# Patient Record
Sex: Female | Born: 1940
Health system: Southern US, Community
[De-identification: ages and names within clinical notes are randomized; demographics above are authoritative.]

## PROBLEM LIST (undated history)

## (undated) DIAGNOSIS — F0390 Unspecified dementia without behavioral disturbance: Secondary | ICD-10-CM

## (undated) DIAGNOSIS — I10 Essential (primary) hypertension: Secondary | ICD-10-CM

## (undated) DIAGNOSIS — R3129 Other microscopic hematuria: Secondary | ICD-10-CM

## (undated) DIAGNOSIS — C4491 Basal cell carcinoma of skin, unspecified: Secondary | ICD-10-CM

## (undated) DIAGNOSIS — E785 Hyperlipidemia, unspecified: Secondary | ICD-10-CM

## (undated) DIAGNOSIS — R911 Solitary pulmonary nodule: Secondary | ICD-10-CM

## (undated) HISTORY — PX: OTHER SURGICAL HISTORY: SHX169

## (undated) HISTORY — DX: Hyperlipidemia, unspecified: E78.5

## (undated) HISTORY — PX: FOOT SURGERY: SHX648

## (undated) HISTORY — PX: BREAST BIOPSY: SHX20

## (undated) HISTORY — DX: Basal cell carcinoma of skin, unspecified: C44.91

## (undated) HISTORY — DX: Solitary pulmonary nodule: R91.1

## (undated) HISTORY — DX: Other microscopic hematuria: R31.29

## (undated) HISTORY — PX: CARPAL TUNNEL RELEASE: SHX101

## (undated) HISTORY — DX: Essential (primary) hypertension: I10

---
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
## 1958-06-24 HISTORY — PX: OOPHORECTOMY: SHX86

## 1982-06-24 HISTORY — PX: ABDOMINAL HYSTERECTOMY: SHX81

## 2002-06-24 HISTORY — PX: TRIGGER FINGER RELEASE: SHX641

## 2008-06-24 DIAGNOSIS — R3129 Other microscopic hematuria: Secondary | ICD-10-CM

## 2008-06-24 HISTORY — DX: Other microscopic hematuria: R31.29

## 2010-06-24 HISTORY — PX: MOHS SURGERY: SUR867

## 2010-09-27 LAB — HM MAMMOGRAPHY: HM Mammogram: NORMAL

## 2011-03-06 DIAGNOSIS — I8390 Asymptomatic varicose veins of unspecified lower extremity: Secondary | ICD-10-CM | POA: Insufficient documentation

## 2011-04-29 ENCOUNTER — Encounter: Payer: Self-pay | Admitting: Internal Medicine

## 2011-04-29 ENCOUNTER — Ambulatory Visit (INDEPENDENT_AMBULATORY_CARE_PROVIDER_SITE_OTHER): Payer: Medicare Other | Admitting: Internal Medicine

## 2011-04-29 DIAGNOSIS — Z79899 Other long term (current) drug therapy: Secondary | ICD-10-CM

## 2011-04-29 DIAGNOSIS — E785 Hyperlipidemia, unspecified: Secondary | ICD-10-CM

## 2011-04-29 DIAGNOSIS — Z9229 Personal history of other drug therapy: Secondary | ICD-10-CM | POA: Insufficient documentation

## 2011-04-29 DIAGNOSIS — R3129 Other microscopic hematuria: Secondary | ICD-10-CM

## 2011-04-29 DIAGNOSIS — Z1239 Encounter for other screening for malignant neoplasm of breast: Secondary | ICD-10-CM | POA: Insufficient documentation

## 2011-04-29 DIAGNOSIS — Z124 Encounter for screening for malignant neoplasm of cervix: Secondary | ICD-10-CM | POA: Insufficient documentation

## 2011-04-29 NOTE — Patient Instructions (Addendum)
We will recheck your cholesterol and decide whether medications are absolutely necessary

## 2011-04-29 NOTE — Progress Notes (Signed)
  Subjective:    Patient ID: Pam Keller, female    DOB: 12-Jun-1941, 70 y.o.   MRN: [id number]  HPI  Pam Keller is a 70 yo white female who presents to establish care.  She has a history of hypertension and hyperlipidimia but discontinued her medications this summer while vacationing at the beach due to having trouble getting her medications refilled through [facility].  She has noticed that since she stopped taking Lipitor her legs have stopped aching.  She is a widow, does not exercise outside of the home except when at the beach, where she usually walks 2 miles.   She has no history of balance or gait disorder and no history of falls.  She is independent with ADLs.  Past Medical History  Diagnosis Date  . Microscopic hematuria 2010    negative workuo   No current outpatient prescriptions on file prior to visit.    Review of Systems  Constitutional: Negative for fever, chills and unexpected weight change.  HENT: Negative for hearing loss, ear pain, nosebleeds, congestion, sore throat, facial swelling, rhinorrhea, sneezing, mouth sores, trouble swallowing, neck pain, neck stiffness, voice change, postnasal drip, sinus pressure, tinnitus and ear discharge.   Eyes: Negative for pain, discharge, redness and visual disturbance.  Respiratory: Negative for cough, chest tightness, shortness of breath, wheezing and stridor.   Cardiovascular: Negative for chest pain, palpitations and leg swelling.  Musculoskeletal: Negative for myalgias and arthralgias.  Skin: Negative for color change and rash.  Neurological: Negative for dizziness, weakness, light-headedness and headaches.  Hematological: Negative for adenopathy.      BP 120/66  Pulse 81  Temp(Src) 98.3 F (36.8 C) (Oral)  Resp 16  Ht 5' 0.5" (1.537 m)  Wt 135 lb 8 oz (61.462 kg)  BMI 26.03 kg/m2  SpO2 98%  Objective:   Physical Exam  Constitutional: She is oriented to person, place, and time. She appears well-developed and  well-nourished.  HENT:  Mouth/Throat: Oropharynx is clear and moist.  Eyes: EOM are normal. Pupils are equal, round, and reactive to light. No scleral icterus.  Neck: Normal range of motion. Neck supple. No JVD present. No thyromegaly present.  Cardiovascular: Normal rate, regular rhythm, normal heart sounds and intact distal pulses.   Pulmonary/Chest: Effort normal and breath sounds normal.  Abdominal: Soft. Bowel sounds are normal. She exhibits no mass. There is no tenderness.  Musculoskeletal: Normal range of motion. She exhibits no edema.  Lymphadenopathy:    She has no cervical adenopathy.  Neurological: She is alert and oriented to person, place, and time.  Skin: Skin is warm and dry.  Psychiatric: She has a normal mood and affect.          Assessment & Plan:  Hypertension:  Currently well controlled.  No changes. 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  Will repeat lipids in 3 months and reassess need for therapy. Pulmonary nodule:  She has apparently had serial surveillance of a pulmonary nodule with next CT scheduled for 07/19/2011 at [facility]. Basal Cell Carcinoma:  She has dermatology followup for histor of skin Ca with [facility]

## 2011-04-30 ENCOUNTER — Other Ambulatory Visit (INDEPENDENT_AMBULATORY_CARE_PROVIDER_SITE_OTHER): Payer: Medicare Other | Admitting: *Deleted

## 2011-04-30 DIAGNOSIS — E785 Hyperlipidemia, unspecified: Secondary | ICD-10-CM

## 2011-04-30 DIAGNOSIS — Z79899 Other long term (current) drug therapy: Secondary | ICD-10-CM

## 2011-04-30 LAB — COMPREHENSIVE METABOLIC PANEL
ALT: 14 U/L (ref 0–35)
Albumin: 4.3 g/dL (ref 3.5–5.2)
Alkaline Phosphatase: 67 U/L (ref 39–117)
Glucose, Bld: 93 mg/dL (ref 70–99)
Potassium: 3.4 mEq/L — ABNORMAL LOW (ref 3.5–5.1)
Sodium: 142 mEq/L (ref 135–145)
Total Protein: 7 g/dL (ref 6.0–8.3)

## 2011-04-30 LAB — LIPID PANEL
HDL: 47.7 mg/dL (ref >39.00)
Total CHOL/HDL Ratio: 6

## 2011-05-01 ENCOUNTER — Other Ambulatory Visit: Payer: Self-pay | Admitting: Internal Medicine

## 2011-05-01 ENCOUNTER — Encounter: Payer: Self-pay | Admitting: Internal Medicine

## 2011-05-01 DIAGNOSIS — I10 Essential (primary) hypertension: Secondary | ICD-10-CM | POA: Insufficient documentation

## 2011-05-01 DIAGNOSIS — E785 Hyperlipidemia, unspecified: Secondary | ICD-10-CM | POA: Insufficient documentation

## 2011-05-01 DIAGNOSIS — C4491 Basal cell carcinoma of skin, unspecified: Secondary | ICD-10-CM | POA: Insufficient documentation

## 2011-05-01 DIAGNOSIS — R911 Solitary pulmonary nodule: Secondary | ICD-10-CM | POA: Insufficient documentation

## 2011-05-01 MED ORDER — TRIAMTERENE-HCTZ 37.5-25 MG PO TABS: 1.0000 | ORAL_TABLET | Freq: Every day | ORAL | Status: DC

## 2011-07-25 DIAGNOSIS — R918 Other nonspecific abnormal finding of lung field: Secondary | ICD-10-CM | POA: Diagnosis not present

## 2011-07-25 DIAGNOSIS — J984 Other disorders of lung: Secondary | ICD-10-CM | POA: Diagnosis not present

## 2011-07-25 DIAGNOSIS — I2510 Atherosclerotic heart disease of native coronary artery without angina pectoris: Secondary | ICD-10-CM | POA: Diagnosis not present

## 2011-08-12 ENCOUNTER — Telehealth: Payer: Self-pay | Admitting: Internal Medicine

## 2011-08-12 MED ORDER — TRIAMTERENE-HCTZ 37.5-25 MG PO TABS: 1.0000 | ORAL_TABLET | Freq: Every day | ORAL | Status: DC

## 2011-08-12 NOTE — Telephone Encounter (Signed)
[phone] Pt walked in needing to get refill on triamteren-hctz 37.5-25mg  cp Take 1 capsule by [facility] that [facility]. Pt is completely out of her meds

## 2011-08-13 ENCOUNTER — Other Ambulatory Visit: Payer: Self-pay | Admitting: *Deleted

## 2011-08-13 DIAGNOSIS — Z85828 Personal history of other malignant neoplasm of skin: Secondary | ICD-10-CM | POA: Diagnosis not present

## 2011-08-13 DIAGNOSIS — L819 Disorder of pigmentation, unspecified: Secondary | ICD-10-CM | POA: Diagnosis not present

## 2011-08-13 DIAGNOSIS — L298 Other pruritus: Secondary | ICD-10-CM | POA: Diagnosis not present

## 2011-08-13 DIAGNOSIS — I872 Venous insufficiency (chronic) (peripheral): Secondary | ICD-10-CM | POA: Diagnosis not present

## 2011-08-13 NOTE — Telephone Encounter (Signed)
Opened in error

## 2011-08-26 ENCOUNTER — Telehealth: Payer: Self-pay | Admitting: Internal Medicine

## 2011-08-26 NOTE — Telephone Encounter (Signed)
[phone]  Home [phone] cell Pt came in today.  She stated she went to the dentist last week dentist stated that he saw pus between the roots of a tooth and he schedule her march 13 to either take out the pus or take out the tooth.  Pt stated the dentist didn't give her any  aniti bodtics  She wanted to get you opinion should she have been given anything prior to this procedure

## 2011-08-27 NOTE — Telephone Encounter (Signed)
Yes she should but I cannot prescribe without seeing her. Do we have a cancellatin this afternoon we can put her in ?

## 2011-08-27 NOTE — Telephone Encounter (Signed)
There are no openings this afternoon.  I have scheduled her for tomorrow afternoon.

## 2011-08-28 ENCOUNTER — Ambulatory Visit (INDEPENDENT_AMBULATORY_CARE_PROVIDER_SITE_OTHER): Payer: Medicare Other | Admitting: Internal Medicine

## 2011-08-28 ENCOUNTER — Encounter: Payer: Self-pay | Admitting: Internal Medicine

## 2011-08-28 VITALS — BP 134/68 | HR 90 | Temp 98.0°F | Resp 14 | Wt 136.2 lb

## 2011-08-28 DIAGNOSIS — K122 Cellulitis and abscess of mouth: Secondary | ICD-10-CM | POA: Diagnosis not present

## 2011-08-28 DIAGNOSIS — K069 Disorder of gingiva and edentulous alveolar ridge, unspecified: Secondary | ICD-10-CM

## 2011-08-28 DIAGNOSIS — K056 Periodontal disease, unspecified: Secondary | ICD-10-CM

## 2011-08-28 MED ORDER — METRONIDAZOLE 500 MG PO TABS: 500.0000 mg | ORAL_TABLET | Freq: Three times a day (TID) | ORAL | Status: AC

## 2011-08-28 MED ORDER — CIPROFLOXACIN HCL 500 MG PO TABS: 500.0000 mg | ORAL_TABLET | Freq: Two times a day (BID) | ORAL | Status: AC

## 2011-08-28 NOTE — Progress Notes (Signed)
Subjective:    Patient ID: Pam Keller, female    DOB: 12/24/1940, 72 y.o.   MRN: [id number]  HPI  71 yr old white female recently seen by dentist and told she had an infected root .  Was told she would need oral suegery but was not given antibiotics.  Patient concerned because her father had an infected tooth pulled and developed blood stream infection and sepsis  Patient denies fevers, chills and tooth pain.  Scheduled to see oral suregeon in one week.  Has allergies (rash swelling) to PCN, and not sure about the listed clindamycin allergy.   Past Medical History  Diagnosis Date  . Microscopic hematuria 2010    negative workuo  . Hyperlipidemia   . Hypertension   . Pulmonary nodule   . Basal cell carcinoma    Current Outpatient Prescriptions on File Prior to Visit  Medication Sig Dispense Refill  . amLODipine (NORVASC) 5 MG tablet Take 5 mg by mouth daily.        Marland Kitchen aspirin 325 MG tablet Take 325 mg by mouth daily.        . calcium carbonate (OS-CAL) 600 MG TABS Take 600 mg by mouth 2 (two) times daily with a meal.        . ibuprofen (ADVIL,MOTRIN) 200 MG tablet Take 200 mg by mouth every 6 (six) hours as needed.        Marland Kitchen losartan (COZAAR) 25 MG tablet Take 25 mg by mouth daily.        . Multiple Vitamin (MULTIVITAMIN) tablet Take 1 tablet by mouth daily.        Marland Kitchen triamterene-hydrochlorothiazide (MAXZIDE-25) 37.5-25 MG per tablet Take 1 each (1 tablet total) by mouth daily.  30 tablet  6       Review of Systems  Constitutional: Negative for fever, chills and unexpected weight change.  HENT: Negative for hearing loss, ear pain, nosebleeds, congestion, sore throat, facial swelling, rhinorrhea, sneezing, mouth sores, trouble swallowing, neck pain, neck stiffness, voice change, postnasal drip, sinus pressure, tinnitus and ear discharge.   Eyes: Negative for pain, discharge, redness and visual disturbance.  Respiratory: Negative for cough, chest tightness, shortness of breath,  wheezing and stridor.   Cardiovascular: Negative for chest pain, palpitations and leg swelling.  Musculoskeletal: Negative for myalgias and arthralgias.  Skin: Negative for color change and rash.  Neurological: Negative for dizziness, weakness, light-headedness and headaches.  Hematological: Negative for adenopathy.       Objective:   Physical Exam  Constitutional: She is oriented to person, place, and time. She appears well-developed and well-nourished.  HENT:  Mouth/Throat: Oropharynx is clear and moist.  Eyes: EOM are normal. Pupils are equal, round, and reactive to light. No scleral icterus.  Neck: Normal range of motion. Neck supple. No JVD present. No thyromegaly present.  Cardiovascular: Normal rate, regular rhythm, normal heart sounds and intact distal pulses.   Pulmonary/Chest: Effort normal and breath sounds normal.  Abdominal: Soft. Bowel sounds are normal. She exhibits no mass. There is no tenderness.  Musculoskeletal: Normal range of motion. She exhibits no edema.  Lymphadenopathy:    She has no cervical adenopathy.  Neurological: She is alert and oriented to person, place, and time.  Skin: Skin is warm and dry.  Psychiatric: She has a normal mood and affect.       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  Awaiting oral surgery.  Given multiple antibiotic  allergies, will treat with cipro and flagyl for one week in anticipation of tooth being pulled.     Updated Medication List Outpatient Encounter Prescriptions as of 08/28/2011  Medication Sig Dispense Refill  . amLODipine (NORVASC) 5 MG tablet Take 5 mg by mouth daily.        Marland Kitchen aspirin 325 MG tablet Take 325 mg by mouth daily.        . calcium carbonate (OS-CAL) 600 MG TABS Take 600 mg by mouth 2 (two) times daily with a meal.        . ibuprofen (ADVIL,MOTRIN) 200 MG tablet Take 200 mg by mouth every 6 (six) hours as needed.        Marland Kitchen  losartan (COZAAR) 25 MG tablet Take 25 mg by mouth daily.        . Multiple Vitamin (MULTIVITAMIN) tablet Take 1 tablet by mouth daily.        Marland Kitchen triamterene-hydrochlorothiazide (MAXZIDE-25) 37.5-25 MG per tablet Take 1 each (1 tablet total) by mouth daily.  30 tablet  6  . ciprofloxacin (CIPRO) 500 MG tablet Take 1 tablet (500 mg total) by mouth 2 (two) times daily.  14 tablet  0  . metroNIDAZOLE (FLAGYL) 500 MG tablet Take 1 tablet (500 mg total) by mouth 3 (three) times daily.  21 tablet  0

## 2011-08-28 NOTE — Patient Instructions (Signed)
I am prescribing two antibiotics to sterilize the infection in your mouth:   Ciprofloxacin 500 mg twice daily for one week  Metronidazole 500 mg three times daily (8 Am 2 pm AND 10 pm)   Avoid all alcoholic beverages until 24 hours after your last dose of metronidazole   You can drink grapefruit juice as long as you wait at least 2 hours after your blood pressure medication

## 2011-08-29 ENCOUNTER — Encounter: Payer: Self-pay | Admitting: Internal Medicine

## 2011-08-29 DIAGNOSIS — K056 Periodontal disease, unspecified: Secondary | ICD-10-CM | POA: Insufficient documentation

## 2011-08-29 NOTE — Assessment & Plan Note (Signed)
Secondary to abscessed tooth, per recent dental evaluation.  Awaiting oral surgery.  Given multiple antibiotic allergies, will treat with cipro and flagyl for one week in anticipation of tooth being pulled.

## 2011-09-27 ENCOUNTER — Encounter: Payer: Self-pay | Admitting: Internal Medicine

## 2011-09-27 ENCOUNTER — Ambulatory Visit (INDEPENDENT_AMBULATORY_CARE_PROVIDER_SITE_OTHER): Payer: Medicare Other | Admitting: Internal Medicine

## 2011-09-27 VITALS — BP 114/66 | HR 90 | Temp 97.9°F | Resp 16 | Wt 135.5 lb

## 2011-09-27 DIAGNOSIS — Z0000 Encounter for general adult medical examination without abnormal findings: Secondary | ICD-10-CM | POA: Diagnosis not present

## 2011-09-27 DIAGNOSIS — Z1211 Encounter for screening for malignant neoplasm of colon: Secondary | ICD-10-CM

## 2011-09-27 DIAGNOSIS — Z1239 Encounter for other screening for malignant neoplasm of breast: Secondary | ICD-10-CM

## 2011-09-27 MED ORDER — AMLODIPINE BESYLATE 5 MG PO TABS: 5.0000 mg | ORAL_TABLET | Freq: Every day | ORAL | Status: DC

## 2011-09-27 NOTE — Patient Instructions (Signed)
Your exam was normal  I recommend you try Red Yeast Rice ,  600 mg twice daily,  To help lower your cholesterol, along with aerobic exercise 5 days per week  Return in June for fasting labs.

## 2011-09-27 NOTE — Progress Notes (Signed)
Patient ID: Pam Keller, female   DOB: 20-Jan-1941, 71 y.o.   MRN: [id number]  The patient is here for annual Medicare wellness examination and management of other chronic and acute problem.  She has had an imporvement in chronic sinus congestion on the left since finsihing cipro and flagyl for an infected tooth .  She has had a laser procedure on the i infectred  tooth and is now taking doxycyline prescribed by her new oral surgeon in [place]  Dr. Burgess Estelle     The risk factors are reflected in the social history.  The roster of all physicians providing medical care to patient - is listed in the Snapshot section of the chart.  Activities of daily living:  The patient is 100% independent in all ADLs: dressing, toileting, feeding as well as independent mobility  Home safety : The patient has smoke detectors in the home. They wear seatbelts.  There are no firearms at home. There is no violence in the home.   There is no risks for hepatitis, STDs or HIV. There is no   history of blood transfusion. They have no travel history to infectious disease endemic areas of the world.  The patient has seen their dentist in the last six month. They have seen their eye doctor in the last year. They admit to slight hearing difficulty with regard to whispered voices and some television programs.  They have deferred audiologic testing in the last year.  They do not  have excessive sun exposure. Discussed the need for sun protection: hats, long sleeves and use of sunscreen if there is significant sun exposure.   Diet: the importance of a healthy diet is discussed. They do have a healthy diet.  The benefits of regular aerobic exercise were discussed. She walks 4 times per week ,  20 minutes.   Depression screen: there are no signs or vegative symptoms of depression- irritability, change in appetite, anhedonia, sadness/tearfullness.  Cognitive assessment: the patient manages all their financial and personal affairs and  is actively engaged. They could relate day,date,year and events; recalled 2/3 objects at 3 minutes; performed clock-face test normally.  The following portions of the patient's history were reviewed and updated as appropriate: allergies, current medications, past family history, past medical history,  past surgical history, past social history  and problem list.  Visual acuity was not assessed per patient preference since she has regular follow up with her ophthalmologist. Hearing and body mass index were assessed and reviewed.   During the course of the visit the patient was educated and counseled about appropriate screening and preventive services including : fall prevention , diabetes screening, nutrition counseling, colorectal cancer screening, and recommended immunizations.    Objective:  BP 114/66  Pulse 90  Temp(Src) 97.9 F (36.6 C) (Oral)  Resp 16  Wt 135 lb 8 oz (61.462 kg)  SpO2 99%  General appearance: alert, cooperative and appears stated age Ears: normal TM's and external ear canals both ears Throat: lips, mucosa, and tongue normal; teeth and gums normal Neck: no adenopathy, no carotid bruit, supple, symmetrical, trachea midline and thyroid not enlarged, symmetric, no tenderness/mass/nodules Back: symmetric, no curvature. ROM normal. No CVA tenderness. Lungs: clear to auscultation bilaterally Heart: regular rate and rhythm, S1, S2 normal, no murmur, click, rub or gallop Abdomen: soft, non-tender; bowel sounds normal; no masses,  no organomegaly Pulses: 2+ and symmetric Skin: Skin color, texture, turgor normal. No rashes or lesions Lymph nodes: Cervical, supraclavicular, and axillary nodes normal.  Assessment and Plan

## 2011-09-29 DIAGNOSIS — Z1211 Encounter for screening for malignant neoplasm of colon: Secondary | ICD-10-CM | POA: Insufficient documentation

## 2011-09-29 NOTE — Assessment & Plan Note (Signed)
Annual mammograms at [facility] prior remote biopsy normal.  willl set up at [facility]  Imaging next April 2013

## 2011-09-29 NOTE — Assessment & Plan Note (Signed)
Breast and pelvic done today.  Sh is s/p hysterectomy.and BSO  No paps need

## 2011-09-29 NOTE — Assessment & Plan Note (Signed)
Last colonoscopy was 2007.

## 2011-10-01 ENCOUNTER — Encounter: Payer: Medicare Other | Admitting: Internal Medicine

## 2011-10-02 DIAGNOSIS — Z1231 Encounter for screening mammogram for malignant neoplasm of breast: Secondary | ICD-10-CM | POA: Diagnosis not present

## 2011-10-08 ENCOUNTER — Telehealth: Payer: Self-pay | Admitting: Internal Medicine

## 2011-10-08 NOTE — Telephone Encounter (Signed)
Her mammogram was normal.  Repeat in one year 

## 2011-10-08 NOTE — Telephone Encounter (Signed)
Patient notified

## 2011-10-10 ENCOUNTER — Telehealth: Payer: Self-pay | Admitting: Internal Medicine

## 2011-10-10 NOTE — Telephone Encounter (Signed)
Left message notifying  Patient.

## 2011-10-10 NOTE — Telephone Encounter (Signed)
Her mammogram was normal.  Repeat in one year 

## 2011-10-17 ENCOUNTER — Encounter: Payer: Self-pay | Admitting: Internal Medicine

## 2011-11-06 ENCOUNTER — Other Ambulatory Visit: Payer: Self-pay | Admitting: Internal Medicine

## 2011-11-06 MED ORDER — TRIAMTERENE-HCTZ 37.5-25 MG PO TABS: 1.0000 | ORAL_TABLET | Freq: Every day | ORAL | Status: DC

## 2011-11-29 ENCOUNTER — Other Ambulatory Visit: Payer: Medicare Other

## 2011-12-11 ENCOUNTER — Other Ambulatory Visit (INDEPENDENT_AMBULATORY_CARE_PROVIDER_SITE_OTHER): Payer: Medicare Other | Admitting: *Deleted

## 2011-12-11 DIAGNOSIS — E785 Hyperlipidemia, unspecified: Secondary | ICD-10-CM | POA: Diagnosis not present

## 2011-12-11 LAB — LIPID PANEL
HDL: 40.1 mg/dL (ref >39.00)
Triglycerides: 334 mg/dL — ABNORMAL HIGH (ref 0.0–149.0)
VLDL: 66.8 mg/dL — ABNORMAL HIGH (ref 0.0–40.0)

## 2012-04-23 DIAGNOSIS — Z961 Presence of intraocular lens: Secondary | ICD-10-CM | POA: Diagnosis not present

## 2012-06-09 ENCOUNTER — Other Ambulatory Visit: Payer: Self-pay | Admitting: Internal Medicine

## 2012-06-09 MED ORDER — TRIAMTERENE-HCTZ 37.5-25 MG PO TABS: 1.0000 | ORAL_TABLET | Freq: Every day | ORAL | Status: DC

## 2012-06-09 NOTE — Telephone Encounter (Signed)
Triamterene hydrochlorothiazide 37.5-25 mg sent to [facility]

## 2012-06-09 NOTE — Telephone Encounter (Signed)
Triamterene -HCTZ 37.10-27-23 MG TB # 30  Take 1 each ( tablet total ) by mouth daily.

## 2012-08-11 DIAGNOSIS — L570 Actinic keratosis: Secondary | ICD-10-CM | POA: Diagnosis not present

## 2012-08-11 DIAGNOSIS — Z85828 Personal history of other malignant neoplasm of skin: Secondary | ICD-10-CM | POA: Diagnosis not present

## 2012-08-11 DIAGNOSIS — L219 Seborrheic dermatitis, unspecified: Secondary | ICD-10-CM | POA: Diagnosis not present

## 2012-08-11 DIAGNOSIS — L820 Inflamed seborrheic keratosis: Secondary | ICD-10-CM | POA: Diagnosis not present

## 2012-09-08 ENCOUNTER — Ambulatory Visit (INDEPENDENT_AMBULATORY_CARE_PROVIDER_SITE_OTHER): Payer: Medicare Other | Admitting: Adult Health

## 2012-09-08 ENCOUNTER — Encounter: Payer: Self-pay | Admitting: Adult Health

## 2012-09-08 ENCOUNTER — Telehealth: Payer: Self-pay | Admitting: Internal Medicine

## 2012-09-08 VITALS — BP 157/84 | HR 73 | Temp 98.5°F | Ht 63.0 in | Wt 137.0 lb

## 2012-09-08 DIAGNOSIS — M542 Cervicalgia: Secondary | ICD-10-CM | POA: Diagnosis not present

## 2012-09-08 DIAGNOSIS — R509 Fever, unspecified: Secondary | ICD-10-CM

## 2012-09-08 DIAGNOSIS — M436 Torticollis: Secondary | ICD-10-CM | POA: Diagnosis not present

## 2012-09-08 DIAGNOSIS — R197 Diarrhea, unspecified: Secondary | ICD-10-CM | POA: Diagnosis not present

## 2012-09-08 MED ORDER — CYCLOBENZAPRINE HCL 5 MG PO TABS: 5.0000 mg | ORAL_TABLET | Freq: Three times a day (TID) | ORAL | Status: DC | PRN

## 2012-09-08 NOTE — Assessment & Plan Note (Signed)
Appears to be muscular in origin. Will check cbc, sed rate, CRP to r/o other causes. Continue ibuprofen every 6 hours as needed, ice/heat for 20 min, 3-4 times a day. I have also order Flexeril for muscle spasms.

## 2012-09-08 NOTE — Telephone Encounter (Signed)
Just an FYI

## 2012-09-08 NOTE — Assessment & Plan Note (Signed)
This has subsided. I suspect this was from food source. She had ordered takeout and developed low grade temp and diarrhea within hours of consuming food. I will check a metabolic panel since she was not able to keep much in her and she was not drinking much fluid. I have instructed patient that if symptoms return or if her temp is 101 or greater she needs to call the office.

## 2012-09-08 NOTE — Progress Notes (Signed)
  Subjective:    Patient ID: Pam Keller, female    DOB: 1940/09/05, 72 y.o.   MRN: [id number]  HPI Patient is a pleasant 72 y/o female who presents with neck stiffness, pain, low grade temp, and diarrhea which has resolved. Sequence of events are as follows:  March 11th - home from [employer] [facility] [employer] March 12th - Felt OK, did a lot of yard work March 13th - Take out from [employer]  - "did not taste right". Later that night she had a temp of 100.  March 14th - Diarrhea all day - Yellow. Took Immodium March 15th - Better then diarrhea afternoon March 16th - Neck pain, lower back of head, no fever March 17th - Pain bilateral sides of neck, also lower head. Fever back up to 100.  Taking ibuprofen 4am, 2pm and 7pm - 2 tablets each time  March 18 - 2am (2 aspirin), 7am (2 ibuprofen), 12:50pm (2 ibuprofen)  She denies pain radiating down her back, nausea or vomiting, photophobia   Review of Systems  Constitutional: Positive for fever. Negative for chills.  HENT: Positive for neck stiffness.   Respiratory: Negative for cough, chest tightness, shortness of breath and wheezing.   Cardiovascular: Negative for chest pain.  Gastrointestinal: Positive for diarrhea. Negative for nausea, vomiting and blood in stool.  Skin: Negative for rash.  Neurological: Negative for dizziness, weakness, light-headedness, numbness and headaches.  Psychiatric/Behavioral: Negative.     BP 157/84  Pulse 73  Temp(Src) 98.5 F (36.9 C) (Oral)  Ht 5\' 3"  (1.6 m)  Wt 137 lb (62.143 kg)  BMI 24.27 kg/m2  SpO2 100%    Objective:   Physical Exam  Constitutional: She is oriented to person, place, and time. She appears well-developed and well-nourished. No distress.  Appears uncomfortable from neck stiffness. She is pleasant and being humorous.  HENT:  Head: Normocephalic and atraumatic.  Mouth/Throat: Oropharynx is clear and moist. No oropharyngeal exudate.  Neck: Neck supple. No tracheal deviation present.  Pain  with movement of head in any direction  Cardiovascular: Normal rate, regular rhythm, normal heart sounds and intact distal pulses.  Exam reveals no gallop.   No murmur heard. Pulmonary/Chest: Effort normal and breath sounds normal. No respiratory distress. She has no wheezes. She has no rales.  Abdominal: Soft. Bowel sounds are normal.  Lymphadenopathy:    She has no cervical adenopathy.  Neurological: She is alert and oriented to person, place, and time. No cranial nerve deficit. Coordination normal.  Negative kernig's & brudzinski's   Skin: Skin is warm and dry. No rash noted.  Psychiatric: She has a normal mood and affect. Her behavior is normal. Judgment and thought content normal.       Assessment & Plan:

## 2012-09-08 NOTE — Telephone Encounter (Signed)
Patient Information:  Caller Name: Katesha  Phone: [phone]  Patient: Pam Keller, Pam Keller  Gender: Female  DOB: March 22, 1941  Age: 72 Years  PCP: Duncan Dull (Adults only)  Office Follow Up:  Does the office need to follow up with this patient?: No  Instructions For The Office: N/A   Symptoms  Reason For Call & Symptoms: Patient reports that for 2-3 days she is having neck discomfort. From the ears downward and back.  There is no discomfort in the front of the neck. Pain with opening mouth.  Denies injury or slept wrong. Recent GI bug 09/03/12 with slight fever over weekend  100.0 and then pain started in back of neck on Sunday 09/06/12. Denies headache or sore throat.  Pain with turning head side to side or touch chin to chest.  Reviewed Health History In EMR: Yes  Reviewed Medications In EMR: Yes  Reviewed Allergies In EMR: Yes  Reviewed Surgeries / Procedures: Yes  Date of Onset of Symptoms: 09/06/2012  Treatments Tried: Ibuprofen , Ice and heat to neck  Treatments Tried Worked: No  Guideline(s) Used:  Neck Pain or Stiffness  Disposition Per Guideline:   See Today in Office  Reason For Disposition Reached:   Patient wants to be seen  Advice Given:  Apply Cold to the Area Or Heat:   During the first 2 days after a mild injury, apply a cold pack or an ice bag (wrapped in a towel) for 20 minutes four times a day. After 2 days, apply a heating pad or hot water bottle to the most painful area for 20 minutes whenever the pain flares up. Wrap hot water bottles or heating pads in a towel to avoid burns.  Pain Medicines:  Ibuprofen (e.g., Motrin, Advil):  Another choice is to take 600 mg (three 200 mg pills) by mouth every 8 hours.  Good Body Mechanics:  Lifting: Stand close to the object to be lifted. Keep your back straight and lift by bending your legs. Ask for help if needed.  Sleeping: Sleep on a firm mattress.  Sitting: Avoid sitting for long periods of time without a  break. Avoid slouching. Place a pillow or towel behind your lower back for support.  Computer screen: place at eye level.  Posture: Maintain good posture.  Call Back If:  Numbness or weakness occurs  You become worse.  Patient Will Follow Care Advice:  YES  Appointment Scheduled:  09/08/2012 15:30:00 Appointment Scheduled Provider:  Orville Govern

## 2012-09-08 NOTE — Patient Instructions (Addendum)
  Please have your labs drawn prior to leaving the office.  For your neck pain:   Take ibuprofen 600 mg every 6 hours as needed for pain. Eat food with the ibuprofen  You can also take tylenol 325 mg every 4 hours as needed.  I am prescribing a muscle relaxer - flexeril. You can take this 3 times a day as needed.   Apply ice alternating with heat for 20 minutes at a time. You can do this 3-4 times daily.  For your diarrhea (if this returns):  If your fever goes above 101, please call the office.  Drink fluids to stay hydrated.

## 2012-09-09 ENCOUNTER — Other Ambulatory Visit: Payer: Self-pay | Admitting: Adult Health

## 2012-09-09 DIAGNOSIS — R899 Unspecified abnormal finding in specimens from other organs, systems and tissues: Secondary | ICD-10-CM

## 2012-09-09 LAB — BASIC METABOLIC PANEL
BUN: 16 mg/dL (ref 6–23)
Calcium: 9.6 mg/dL (ref 8.4–10.5)
Chloride: 96 mEq/L (ref 96–112)
Creatinine, Ser: 1.1 mg/dL (ref 0.4–1.2)
GFR: 54.54 mL/min — ABNORMAL LOW (ref >60.00)

## 2012-09-09 LAB — CBC WITH DIFFERENTIAL/PLATELET
Basophils Absolute: 0 10*3/uL (ref 0.0–0.1)
Eosinophils Absolute: 0.1 10*3/uL (ref 0.0–0.7)
Eosinophils Relative: 1.2 % (ref 0.0–5.0)
HCT: 34.6 % — ABNORMAL LOW (ref 36.0–46.0)
Lymphs Abs: 1.4 10*3/uL (ref 0.7–4.0)
MCV: 86.4 fl (ref 78.0–100.0)
Monocytes Absolute: 0.6 10*3/uL (ref 0.1–1.0)
Neutrophils Relative %: 73.2 % (ref 43.0–77.0)
Platelets: 189 10*3/uL (ref 150.0–400.0)
RDW: 14.1 % (ref 11.5–14.6)
WBC: 7.9 10*3/uL (ref 4.5–10.5)

## 2012-09-09 LAB — C-REACTIVE PROTEIN: CRP: 7.5 mg/dL (ref 0.5–20.0)

## 2012-09-09 LAB — SEDIMENTATION RATE: Sed Rate: 35 mm/hr — ABNORMAL HIGH (ref 0–22)

## 2012-09-16 ENCOUNTER — Other Ambulatory Visit (INDEPENDENT_AMBULATORY_CARE_PROVIDER_SITE_OTHER): Payer: Medicare Other

## 2012-09-16 DIAGNOSIS — R6889 Other general symptoms and signs: Secondary | ICD-10-CM

## 2012-09-16 DIAGNOSIS — R899 Unspecified abnormal finding in specimens from other organs, systems and tissues: Secondary | ICD-10-CM

## 2012-09-16 LAB — BASIC METABOLIC PANEL
Calcium: 9.3 mg/dL (ref 8.4–10.5)
GFR: 70.55 mL/min (ref >60.00)
Glucose, Bld: 70 mg/dL (ref 70–99)
Potassium: 3.3 mEq/L — ABNORMAL LOW (ref 3.5–5.1)
Sodium: 140 mEq/L (ref 135–145)

## 2012-09-29 ENCOUNTER — Encounter: Payer: Self-pay | Admitting: Internal Medicine

## 2012-09-29 ENCOUNTER — Ambulatory Visit (INDEPENDENT_AMBULATORY_CARE_PROVIDER_SITE_OTHER): Payer: Medicare Other | Admitting: Internal Medicine

## 2012-09-29 VITALS — BP 142/80 | HR 79 | Temp 98.0°F | Resp 16 | Ht 61.0 in | Wt 136.8 lb

## 2012-09-29 DIAGNOSIS — Z0000 Encounter for general adult medical examination without abnormal findings: Secondary | ICD-10-CM

## 2012-09-29 DIAGNOSIS — Z124 Encounter for screening for malignant neoplasm of cervix: Secondary | ICD-10-CM | POA: Diagnosis not present

## 2012-09-29 DIAGNOSIS — M436 Torticollis: Secondary | ICD-10-CM | POA: Diagnosis not present

## 2012-09-29 DIAGNOSIS — I10 Essential (primary) hypertension: Secondary | ICD-10-CM

## 2012-09-29 DIAGNOSIS — E876 Hypokalemia: Secondary | ICD-10-CM

## 2012-09-29 DIAGNOSIS — E785 Hyperlipidemia, unspecified: Secondary | ICD-10-CM

## 2012-09-29 DIAGNOSIS — R51 Headache: Secondary | ICD-10-CM

## 2012-09-29 DIAGNOSIS — Z1239 Encounter for other screening for malignant neoplasm of breast: Secondary | ICD-10-CM

## 2012-09-29 LAB — COMPREHENSIVE METABOLIC PANEL
Albumin: 4 g/dL (ref 3.5–5.2)
Alkaline Phosphatase: 80 U/L (ref 39–117)
BUN: 15 mg/dL (ref 6–23)
Calcium: 10.5 mg/dL (ref 8.4–10.5)
Creatinine, Ser: 1 mg/dL (ref 0.4–1.2)
Glucose, Bld: 92 mg/dL (ref 70–99)
Potassium: 3.5 mEq/L (ref 3.5–5.1)

## 2012-09-29 LAB — LIPID PANEL
Cholesterol: 243 mg/dL — ABNORMAL HIGH (ref 0–200)
Total CHOL/HDL Ratio: 7
Triglycerides: 235 mg/dL — ABNORMAL HIGH (ref 0.0–149.0)

## 2012-09-29 MED ORDER — MELOXICAM 15 MG PO TABS: 15.0000 mg | ORAL_TABLET | Freq: Every day | ORAL | Status: DC

## 2012-09-29 NOTE — Progress Notes (Signed)
Patient ID: Pam Keller, female   DOB: 03-19-1941, 72 y.o.   MRN: [id number]  The patient is here for annual Medicare wellness examination and management of other chronic and acute probHere for her  annual wellness exam but has several  acute complaints .  She has been having an Occipital headache, bilateral for 2 weeks accompaneid by bilateral lneck pain form post auricular region to clavicle (SCM).  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  Has improved . She is also having Purulent sinus drainage,  no fevers,  X 1 week  No medications.   Prior use of Flonase in the past for allergic rhinitis.  lems.    The risk factors are reflected in the social history.  The roster of all physicians providing medical care to patient - is listed in the Snapshot section of the chart.  Activities of daily living:  The patient is 100% independent in all ADLs: dressing, toileting, feeding as well as independent mobility  Home safety : The patient has smoke detectors in the home. They wear seatbelts.  There are no firearms at home. There is no violence in the home.   There is no risks for hepatitis, STDs or HIV. There is no   history of blood transfusion. They have no travel history to infectious disease endemic areas of the world.  The patient has seen their dentist in the last six month. They have seen their eye doctor in the last year. They admit to slight hearing difficulty with regard to whispered voices and some television programs.  They have deferred audiologic testing in the last year.  They do not  have excessive sun exposure. Discussed the need for sun protection: hats, long sleeves and use of sunscreen if there is significant sun exposure.   Diet: the importance of a healthy diet is discussed. They do have a healthy diet.  The benefits of regular aerobic exercise were discussed. She walks 4 times per week ,  20 minutes.   Depression screen: there  are no signs or vegative symptoms of depression- irritability, change in appetite, anhedonia, sadness/tearfullness.  Cognitive assessment: the patient manages all their financial and personal affairs and is actively engaged. They could relate day,date,year and events; recalled 2/3 objects at 3 minutes; performed clock-face test normally.  The following portions of the patient's history were reviewed and updated as appropriate: allergies, current medications, past family history, past medical history,  past surgical history, past social history  and problem list.  Visual acuity was not assessed per patient preference since she has regular follow up with her ophthalmologist. Hearing and body mass index were assessed and reviewed.   During the course of the visit the patient was educated and counseled about appropriate screening and preventive services including : fall prevention , diabetes screening, nutrition counseling, colorectal cancer screening, and recommended immunizations.    Objective:   BP 142/80  Pulse 79  Temp(Src) 98 F (36.7 C) (Oral)  Resp 16  Ht 5\' 1"  (1.549 m)  Wt 136 lb 12 oz (62.029 kg)  BMI 25.85 kg/m2  SpO2 98%  General Appearance:    Alert, cooperative, no distress, appears stated age  Head:    Normocephalic, without obvious abnormality, atraumatic  Eyes:    PERRL, conjunctiva/corneas clear, EOM's intact, fundi    benign, both eyes  Ears:    Normal TM's and external ear canals, both ears  Nose:   Nares  normal, septum midline, mucosa normal, no drainage    or sinus tenderness  Throat:   Lips, mucosa, and tongue normal; teeth and gums normal  Neck:   Supple, symmetrical, trachea midline, no adenopathy;    thyroid:  no enlargement/tenderness/nodules; no carotid   bruit or JVD  Back:     Symmetric, no curvature, ROM normal, no CVA tenderness  Lungs:     Clear to auscultation bilaterally, respirations unlabored  Chest Wall:    No tenderness or deformity   Heart:     Regular rate and rhythm, S1 and S2 normal, no murmur, rub   or gallop  Breast Exam:    No tenderness, masses, or nipple abnormality  Abdomen:     Soft, non-tender, bowel sounds active all four quadrants,    no masses, no organomegaly  Extremities:   Extremities normal, atraumatic, no cyanosis or edema  Pulses:   2+ and symmetric all extremities  Skin:   Skin color, texture, turgor normal, no rashes or lesions  Lymph nodes:   Cervical, supraclavicular, and axillary nodes normal  Neurologic:   CNII-XII intact, normal strength, sensation and reflexes    throughout    Assessment and Plan:  Screening for cervical cancer Pelvic was done last year to confirm absence of cervix.    No pelvic done today   Neck stiffness Bilateral SCM tdnerness without lymphadeopathy on exam  liek musculoskeletal.  Trial of meloxicam   Annual physical exam Annual exam including breast ,exam was done  today. All screenings are up to date.   Hypertension Well controlled on current regimen. Renal function stable, no changes today.  Occipital headache She is concerned that her persistent headache may be due to brain tumor given the family history . There is no LAD on exam and neuro exam is normal.  Reassurance provided that her current neck pain appears to be due to muscle strain from recent prolonged yard work. Recommended use of a  NSAIDs, muscle relaxer and ice packs and pain persists  after 2 weeks we will consider imaging the brain .    Updated Medication List Outpatient Encounter Prescriptions as of 09/29/2012  Medication Sig Dispense Refill  . amLODipine (NORVASC) 5 MG tablet Take 1 tablet (5 mg total) by mouth daily.  90 tablet  3  . aspirin 325 MG tablet Take 325 mg by mouth daily.        . Calcium Carbonate-Vitamin D (CALCIUM + D PO) Take by mouth.      . triamterene-hydrochlorothiazide (MAXZIDE-25) 37.5-25 MG per tablet Take 1 each (1 tablet total) by mouth daily.  90 tablet  3  . calcium carbonate  (OS-CAL) 600 MG TABS Take 600 mg by mouth 2 (two) times daily with a meal.        . cyclobenzaprine (FLEXERIL) 5 MG tablet Take 1 tablet (5 mg total) by mouth 3 (three) times daily as needed for muscle spasms.  30 tablet  1  . ibuprofen (ADVIL,MOTRIN) 200 MG tablet Take 200 mg by mouth every 6 (six) hours as needed.        . meloxicam (MOBIC) 15 MG tablet Take 1 tablet (15 mg total) by mouth daily.  30 tablet  1  . Multiple Vitamin (MULTIVITAMIN) tablet Take 1 tablet by mouth daily.         No facility-administered encounter medications on file as of 09/29/2012.

## 2012-09-29 NOTE — Assessment & Plan Note (Signed)
She is concerned that her persistent headache may be due to brain tumor given the family history . There is no LAD on exam and neuro exam is normal.  Reassurance provided that her current neck pain appears to be due to muscle strain from recent prolonged yard work. Recommended use of a proton ice packs and to discontinue after 2 weeks we will consider imaging the

## 2012-09-29 NOTE — Assessment & Plan Note (Signed)
Annual exam including breast ,exam was done  today. All screenings are up to date.

## 2012-09-29 NOTE — Assessment & Plan Note (Signed)
Bilateral SCM tdnerness without lymphadeopathy on exam  liek musculoskeletal.  Trial of meloxicam

## 2012-09-29 NOTE — Assessment & Plan Note (Signed)
Well controlled on current regimen. Renal function stable, no changes today. 

## 2012-09-29 NOTE — Patient Instructions (Addendum)
Please try the meloxicam for your headache and neck strain instead of ibuprofen,  Yo may continue one aspirin daily and add tylenol as needed.  If no better in 2 weeks and we will order an MRI of spine.   Your cholesterol was rechecked today .  If it is still high, you will need to start the cholesterol medication  Try flushing your sinuses twice daily to get rid of the pollen with Simply saline  If you develop fever ,  Sinus pain or blood in your sinus drainage,  Call for an antibioitc.

## 2012-09-29 NOTE — Assessment & Plan Note (Signed)
Pelvic was done last year to confirm absence of cervix.    No pelvic done today

## 2012-10-04 LAB — HM MAMMOGRAPHY

## 2012-10-08 DIAGNOSIS — Z1231 Encounter for screening mammogram for malignant neoplasm of breast: Secondary | ICD-10-CM | POA: Diagnosis not present

## 2012-10-30 ENCOUNTER — Encounter: Payer: Self-pay | Admitting: Internal Medicine

## 2013-01-12 ENCOUNTER — Telehealth: Payer: Self-pay | Admitting: Internal Medicine

## 2013-01-12 MED ORDER — AMLODIPINE BESYLATE 5 MG PO TABS: 5.0000 mg | ORAL_TABLET | Freq: Every day | ORAL | Status: DC

## 2013-01-12 NOTE — Telephone Encounter (Signed)
Refills sent to [facility].  Left message on patient's voice mail advising her that refills were sent to pharmacy.

## 2013-01-12 NOTE — Telephone Encounter (Signed)
Pt came in today needing a refill on amlodipone tab 5mg    Take 1 tablet daily Pt has enough to last 2 weeks [facility] mail order Pt would like to get 90 day supply at a time Please advise pt when called in

## 2013-03-16 ENCOUNTER — Encounter: Payer: Self-pay | Admitting: Internal Medicine

## 2013-03-16 ENCOUNTER — Ambulatory Visit (INDEPENDENT_AMBULATORY_CARE_PROVIDER_SITE_OTHER): Payer: Medicare Other | Admitting: Internal Medicine

## 2013-03-16 VITALS — BP 156/80 | HR 70 | Temp 98.0°F | Resp 14 | Ht 61.0 in | Wt 137.0 lb

## 2013-03-16 DIAGNOSIS — B079 Viral wart, unspecified: Secondary | ICD-10-CM | POA: Diagnosis not present

## 2013-03-16 NOTE — Progress Notes (Signed)
Patient ID: Pam Keller, female   DOB: [birth year], 72 y.o.   MRN: [id number]   Patient Active Problem List   Diagnosis Date Noted  . Verrucous skin lesion 03/16/2013  . Occipital headache 09/29/2012  . Neck stiffness 09/08/2012  . Screening for colon cancer 09/29/2011  . Annual physical exam 09/27/2011  . Hyperlipidemia   . Hypertension   . Pulmonary nodule   . Basal cell carcinoma   . Screening for breast cancer 04/29/2011  . Screening for cervical cancer 04/29/2011  . History of postmenopausal HRT 04/29/2011  . Microscopic hematuria     Subjective:  CC:   Chief Complaint  Patient presents with  . Acute Visit    place is on left shoulder. raised, symetrical     HPI:   Pam Keller a 72 y.o. female who presents For evaluation of a Raised verrucous papillary  lesion on her left shoulder which she states has been present for about a week .  Has not bled.,  Has had No contact with kids or amphibians but she does have an  indoor cat.  Her dermatologic history includes  a basal cell ca removed from left forehead last year at [facility], which she reports that was dismissed as normal by Diona Browner earlier in the year but was noticed during her prior annual exam and referred to [facility] for biopsy and excision    Past Medical History  Diagnosis Date  . Microscopic hematuria 2010    negative workuo  . Hyperlipidemia   . Hypertension   . Pulmonary nodule   . Basal cell carcinoma     Past Surgical History  Procedure Laterality Date  . Macular surgery      [facility], repaired  . Mohs surgery  2012    for basal cell, forehead  . Appendectomy  1955    for rupture appx  . Oophorectomy  1960  . Abdominal hysterectomy  1984    menorrhagia  . Breast biopsy      benign  . Trigger finger release  2004    [facility]  . Carpal tunnel release         The following portions of the patient's history were reviewed and updated as appropriate: Allergies, current medications, and  problem list.    Review of Systems:   12 Pt  review of systems was negative except those addressed in the HPI,     History   Social History  . Marital Status: Widowed    Spouse Name: Roe Coombs    Number of Children: N/A  . Years of Education: N/A   Occupational History  . Not on file.   Social History Main Topics  . Smoking status: Former Smoker    Types: Cigarettes    Quit date: 04/29/2007  . Smokeless tobacco: Never Used  . Alcohol Use: Yes     Comment: Occasionally  . Drug Use: No  . Sexual Activity: Not on file   Other Topics Concern  . Not on file   Social History Narrative   Retired Paramedic          Objective:  Filed Vitals:   03/16/13 1154  BP: 156/80  Pulse: 70  Temp: 98 F (36.7 C)  Resp: 14     General appearance: alert, cooperative and appears stated age Lungs: clear to auscultation bilaterally Heart: regular rate and rhythm, S1, S2 normal, no murmur, click, rub or gallop Abdomen: soft, non-tender; bowel sounds normal; no masses,  no organomegaly Pulses:  2+ and symmetric Skin:  verrucous papillary lesion on top of left shoulder,  eraser head sized.  Lymph nodes: Cervical, supraclavicular, and axillary nodes normal.  Assessment and Plan:  Verrucous skin lesion She has a new lesion this occurred in the top of her left shoulder. It does not appear  be a basal cell or squamous cell carcinoma but given its recent occurrence and its location it  is getting irritated frequently. will refer to Dr. Isa Rankin for removal.   Updated Medication List Outpatient Encounter Prescriptions as of 03/16/2013  Medication Sig Dispense Refill  . amLODipine (NORVASC) 5 MG tablet Take 1 tablet (5 mg total) by mouth daily.  90 tablet  1  . aspirin 325 MG tablet Take 325 mg by mouth daily.        . calcium carbonate (OS-CAL) 600 MG TABS Take 600 mg by mouth 2 (two) times daily with a meal.        . Multiple Vitamin (MULTIVITAMIN) tablet Take 1 tablet by mouth  daily.        Marland Kitchen triamterene-hydrochlorothiazide (MAXZIDE-25) 37.5-25 MG per tablet Take 1 each (1 tablet total) by mouth daily.  90 tablet  3  . Calcium Carbonate-Vitamin D (CALCIUM + D PO) Take by mouth.      . cyclobenzaprine (FLEXERIL) 5 MG tablet Take 1 tablet (5 mg total) by mouth 3 (three) times daily as needed for muscle spasms.  30 tablet  1  . ibuprofen (ADVIL,MOTRIN) 200 MG tablet Take 200 mg by mouth every 6 (six) hours as needed.        . meloxicam (MOBIC) 15 MG tablet Take 1 tablet (15 mg total) by mouth daily.  30 tablet  1   No facility-administered encounter medications on file as of 03/16/2013.     Orders Placed This Encounter  Procedures  . Ambulatory referral to Dermatology    No Follow-up on file.

## 2013-03-18 NOTE — Assessment & Plan Note (Signed)
She has a new lesion this occurred in the top of her left shoulder. It does not appear  be a basal cell or squamous cell carcinoma but given its recent occurrence and its location it  is getting irritated frequently. will refer to Dr. Isa Rankin for removal.

## 2013-04-22 DIAGNOSIS — H35389 Toxic maculopathy, unspecified eye: Secondary | ICD-10-CM | POA: Diagnosis not present

## 2013-06-14 DIAGNOSIS — L819 Disorder of pigmentation, unspecified: Secondary | ICD-10-CM | POA: Diagnosis not present

## 2013-06-14 DIAGNOSIS — L821 Other seborrheic keratosis: Secondary | ICD-10-CM | POA: Diagnosis not present

## 2013-07-19 ENCOUNTER — Telehealth: Payer: Self-pay | Admitting: Internal Medicine

## 2013-07-19 NOTE — Telephone Encounter (Signed)
FYI

## 2013-07-19 NOTE — Telephone Encounter (Signed)
Patient Information:  Caller Name: Norabelle  Phone: [phone]  Patient: Pam Keller  Gender: Female  DOB: 1941-01-21  Age: 73 Years  PCP: Deborra Medina (Adults only)  Office Follow Up:  Does the office need to follow up with this patient?: No  Instructions For The Office: N/A  RN Note:  Home care advice and call back information.  Pt states that she will call back to make appt  Symptoms  Reason For Call & Symptoms: Pt states that she was washing hair on 1/23 and got water in ear.  Pt states that she has tried to lay on that side and has applied heat to the ear and still has the sensation that water is in ear.  Reviewed Health History In EMR: Yes  Reviewed Medications In EMR: Yes  Reviewed Allergies In EMR: Yes  Reviewed Surgeries / Procedures: Yes  Date of Onset of Symptoms: 07/16/2013  Guideline(s) Used:  Ear - Congestion  Disposition Per Guideline:   Home Care  Reason For Disposition Reached:   Ear congestion  Advice Given:  Causes  Upper respiratory infections (colds) and nasal allergies (hay fever) can block the eustachian tube.  Blowing the nose too hard can also push air and fluid into the eustachian tube.  Reassurance:  Definition: Ear congestion is the medical term used to describe symptoms of a stuffy, full, or plugged sensation in the ear. People also sometimes describe crackling or popping noises in the ear. Sometimes hearing seems slightly muffled.  Eustacian tube: There is a small collapsible tube that runs between the middle ear and the nose. Normally, it permits tiny amounts of air to move in and out of the middle ear. When the tube gets blocked, air or fluid can build up behind the ear drum (tympanic membrane). This causes the symptoms of ear congestion.  Here is some care advice that should help.  Treatment - Chewing and Swallowing:   Try chewing gum.  You can also try swallowing water while pinching your nostrils closed. The reason this works is  that it creates a small vacuum in the nose. This helps the eustachian tube to open up.  Treatment - Decongestant Nasal Spray:  If chewing or swallowing doesn't help after 1 or 2 hours, you can try using an over-the-counter (OTC) nasal decongestant drops. You can use the nose drops twice a day.  Oxymetazoline Nasal Drops (e.g., Afrin): Available OTC. Clean out the nose before using. Spray each nostril once, wait one minute for it to absorb, and then spray a second time.  Phenylephrine Nasal Drops (e.g., Neo-Synephrine): Available OTC. Clean out the nose before using. Spray each nostril once, wait one minute for it to absorb, and then spray a second time.  Before taking any medicine, read all the instructions on the package.  Caution - Antihistamines (e.g. diphenhydramine (Benadryl), chlorpheniramine (Chlortrimeton, Chlor-tripolon)):   May cause sleepiness. Do not drink alcohol, drive or operate dangerous machinery while taking antihistamines.  Expected Course:   The symptoms usually get better within 2 days (48 hours) with treatment.  Call Back If:   Ear congestion lasts over 48 hours  Ear pain or fever occurs  You become worse.  Patient Will Follow Care Advice:  YES  Triage and Documentation performed by Sherryle Lis, RN-CAN

## 2013-10-04 ENCOUNTER — Ambulatory Visit (INDEPENDENT_AMBULATORY_CARE_PROVIDER_SITE_OTHER): Payer: Medicare Other | Admitting: Internal Medicine

## 2013-10-04 ENCOUNTER — Encounter: Payer: Self-pay | Admitting: Internal Medicine

## 2013-10-04 VITALS — BP 134/70 | HR 67 | Temp 97.6°F | Resp 16 | Ht 61.0 in | Wt 135.5 lb

## 2013-10-04 DIAGNOSIS — Z113 Encounter for screening for infections with a predominantly sexual mode of transmission: Secondary | ICD-10-CM | POA: Diagnosis not present

## 2013-10-04 DIAGNOSIS — I10 Essential (primary) hypertension: Secondary | ICD-10-CM | POA: Diagnosis not present

## 2013-10-04 DIAGNOSIS — F5104 Psychophysiologic insomnia: Secondary | ICD-10-CM

## 2013-10-04 DIAGNOSIS — R0989 Other specified symptoms and signs involving the circulatory and respiratory systems: Secondary | ICD-10-CM

## 2013-10-04 DIAGNOSIS — Z0000 Encounter for general adult medical examination without abnormal findings: Secondary | ICD-10-CM | POA: Diagnosis not present

## 2013-10-04 DIAGNOSIS — Z23 Encounter for immunization: Secondary | ICD-10-CM | POA: Diagnosis not present

## 2013-10-04 DIAGNOSIS — E785 Hyperlipidemia, unspecified: Secondary | ICD-10-CM

## 2013-10-04 DIAGNOSIS — Z1239 Encounter for other screening for malignant neoplasm of breast: Secondary | ICD-10-CM

## 2013-10-04 DIAGNOSIS — R5383 Other fatigue: Secondary | ICD-10-CM | POA: Diagnosis not present

## 2013-10-04 DIAGNOSIS — Z124 Encounter for screening for malignant neoplasm of cervix: Secondary | ICD-10-CM

## 2013-10-04 DIAGNOSIS — G4700 Insomnia, unspecified: Secondary | ICD-10-CM

## 2013-10-04 DIAGNOSIS — R5381 Other malaise: Secondary | ICD-10-CM | POA: Diagnosis not present

## 2013-10-04 DIAGNOSIS — E559 Vitamin D deficiency, unspecified: Secondary | ICD-10-CM

## 2013-10-04 DIAGNOSIS — H6120 Impacted cerumen, unspecified ear: Secondary | ICD-10-CM

## 2013-10-04 DIAGNOSIS — Z79899 Other long term (current) drug therapy: Secondary | ICD-10-CM

## 2013-10-04 DIAGNOSIS — Z13820 Encounter for screening for osteoporosis: Secondary | ICD-10-CM

## 2013-10-04 LAB — CBC WITH DIFFERENTIAL/PLATELET
Basophils Absolute: 0 10*3/uL (ref 0.0–0.1)
Basophils Relative: 0.6 % (ref 0.0–3.0)
Eosinophils Absolute: 0.2 10*3/uL (ref 0.0–0.7)
Eosinophils Relative: 3.2 % (ref 0.0–5.0)
HCT: 38 % (ref 36.0–46.0)
Hemoglobin: 12.8 g/dL (ref 12.0–15.0)
LYMPHS PCT: 33.5 % (ref 12.0–46.0)
Lymphs Abs: 2.4 10*3/uL (ref 0.7–4.0)
MCHC: 33.6 g/dL (ref 30.0–36.0)
MCV: 88.1 fl (ref 78.0–100.0)
Monocytes Absolute: 0.6 10*3/uL (ref 0.1–1.0)
Monocytes Relative: 8.4 % (ref 3.0–12.0)
Neutro Abs: 3.9 10*3/uL (ref 1.4–7.7)
Neutrophils Relative %: 54.3 % (ref 43.0–77.0)
PLATELETS: 235 10*3/uL (ref 150.0–400.0)
RBC: 4.32 Mil/uL (ref 3.87–5.11)
RDW: 14.4 % (ref 11.5–14.6)
WBC: 7.2 10*3/uL (ref 4.5–10.5)

## 2013-10-04 LAB — COMPREHENSIVE METABOLIC PANEL
ALK PHOS: 62 U/L (ref 39–117)
ALT: 16 U/L (ref 0–35)
AST: 30 U/L (ref 0–37)
Albumin: 4.2 g/dL (ref 3.5–5.2)
BILIRUBIN TOTAL: 0.9 mg/dL (ref 0.3–1.2)
BUN: 16 mg/dL (ref 6–23)
CHLORIDE: 98 meq/L (ref 96–112)
CO2: 27 mEq/L (ref 19–32)
Calcium: 10.1 mg/dL (ref 8.4–10.5)
Creatinine, Ser: 1 mg/dL (ref 0.4–1.2)
GFR: 58.89 mL/min — ABNORMAL LOW (ref >60.00)
GLUCOSE: 87 mg/dL (ref 70–99)
POTASSIUM: 3.7 meq/L (ref 3.5–5.1)
Sodium: 138 mEq/L (ref 135–145)
Total Protein: 7.3 g/dL (ref 6.0–8.3)

## 2013-10-04 LAB — TSH: TSH: 0.77 u[IU]/mL (ref 0.35–5.50)

## 2013-10-04 LAB — LIPID PANEL
CHOLESTEROL: 264 mg/dL — AB (ref 0–200)
HDL: 41.6 mg/dL (ref >39.00)
LDL Cholesterol: 191 mg/dL — ABNORMAL HIGH (ref 0–99)
TRIGLYCERIDES: 158 mg/dL — AB (ref 0.0–149.0)
Total CHOL/HDL Ratio: 6
VLDL: 31.6 mg/dL (ref 0.0–40.0)

## 2013-10-04 MED ORDER — AMLODIPINE BESYLATE 5 MG PO TABS: 5.0000 mg | ORAL_TABLET | Freq: Every day | ORAL | Status: DC

## 2013-10-04 MED ORDER — TRIAMTERENE-HCTZ 37.5-25 MG PO TABS: 1.0000 | ORAL_TABLET | Freq: Every day | ORAL | Status: DC

## 2013-10-04 MED ORDER — MELOXICAM 15 MG PO TABS: 15.0000 mg | ORAL_TABLET | Freq: Every day | ORAL | Status: DC

## 2013-10-04 MED ORDER — MELATONIN-TRYPTOPHAN 3-100 MG PO CAPS: 1.0000 | ORAL_CAPSULE | Freq: Every day | ORAL | Status: DC

## 2013-10-04 NOTE — Progress Notes (Signed)
Pre-visit discussion using our clinic review tool. No additional management support is needed unless otherwise documented below in the visit note.  

## 2013-10-04 NOTE — Progress Notes (Signed)
Patient ID: Pam Keller, female   DOB: March 16, 1941, 73 y.o.   MRN: [id number]  The patient is here for annual Medicare wellness examination and management of other chronic and acute problems.  1)Husband died in 10-Oct-2010.  She is not depressed but feels  overwhelmed by the yardwork,  but not the house. Children are supportive.  She remains socially interactive.     2) Trouble staying asleep,  Chronic.  Has a long history of working 3rd shift.  Wakes up between  2 to 4 am most night and can't go back to sleep.  Not always due to noctuira,  Sometimes the cat. No anxiety.   Discussed trial of melatonin  3) back pain.  Aggravated by yard work.  Felt she pulled a muscle when she bent over.spent ours pulling dandelions.   Her left buttock hurt for a while ,  Now just lower back on both  Sides.   Pain with changing position .  Taking ibuprofen 2  Once daily .  Discussed changing to meloxicam once daily   4) Right ear pain, occurred after washing hair , unable to dispel the water in it.  Feels there's still fluid in it  Ear exam shows cerumen impaction   No inflammation.  Advised to return on Tuesday for flushing,   The risk factors are reflected in the social history.  The roster of all physicians providing medical care to patient - is listed in the Snapshot section of the chart.  Activities of daily living:  The patient is 100% independent in all ADLs: dressing, toileting, feeding as well as independent mobility  Home safety : The patient has smoke detectors in the home. They wear seatbelts.  There are no firearms at home. There is no violence in the home.   There is no risks for hepatitis, STDs or HIV. There is no   history of blood transfusion. They have no travel history to infectious disease endemic areas of the world.  The patient has seen their dentist in the last six month. They have seen their eye doctor in the last year. They admit to slight hearing difficulty with regard to whispered voices  and some television programs.  They have deferred audiologic testing in the last year.  They do not  have excessive sun exposure. Discussed the need for sun protection: hats, long sleeves and use of sunscreen if there is significant sun exposure.   Diet: the importance of a healthy diet is discussed. They do have a healthy diet.  The benefits of regular aerobic exercise were discussed. She walks 4 times per week ,  20 minutes.   Depression screen: there are no signs or vegative symptoms of depression- irritability, change in appetite, anhedonia, sadness/tearfullness.  Cognitive assessment: the patient manages all their financial and personal affairs and is actively engaged. They could relate day,date,year and events; recalled 2/3 objects at 3 minutes; performed clock-face test normally.  The following portions of the patient's history were reviewed and updated as appropriate: allergies, current medications, past family history, past medical history,  past surgical history, past social history  and problem list.  Visual acuity was not assessed per patient preference since she has regular follow up with her ophthalmologist. Hearing and body mass index were assessed and reviewed.   During the course of the visit the patient was educated and counseled about appropriate screening and preventive services including : fall prevention , diabetes screening, nutrition counseling, colorectal cancer screening, and recommended immunizations.  Objective:  BP 134/70  Pulse 67  Temp(Src) 97.6 F (36.4 C) (Oral)  Resp 16  Ht 5\' 1"  (1.549 m)  Wt 135 lb 8 oz (61.462 kg)  BMI 25.62 kg/m2  SpO2 99%  General appearance: alert, cooperative and appears stated age Head: Normocephalic, without obvious abnormality, atraumatic Eyes: conjunctivae/corneas clear. PERRL, EOM's intact. Fundi benign. Ears: normal TM's and external ear canals both ears Nose: Nares normal. Septum midline. Mucosa normal. No drainage or  sinus tenderness. Throat: lips, mucosa, and tongue normal; teeth and gums normal Neck: no adenopathy, no carotid bruit, no JVD, supple, symmetrical, trachea midline and thyroid not enlarged, symmetric, no tenderness/mass/nodules Lungs: clear to auscultation bilaterally Breasts: normal appearance, no masses or tenderness Heart: regular rate and rhythm, S1, S2 normal, no murmur, click, rub or gallop Abdomen: soft, non-tender; bowel sounds normal; no masses,  no organomegaly Extremities: extremities normal, atraumatic, no cyanosis or edema Pulses: 2+ and symmetric Skin: Skin color, texture, turgor normal. No rashes or lesions Neurologic: Alert and oriented X 3, normal strength and tone. Normal symmetric reflexes. Normal coordination and gait.   Assessment and Plan:  Hypertension Well controlled on current regimen. Renal function stable, no changes today.  Lab Results  Component Value Date   CREATININE 1.0 10/04/2013   Lab Results  Component Value Date   NA 138 10/04/2013   K 3.7 10/04/2013   CL 98 10/04/2013   CO2 27 10/04/2013     Screening for cervical cancer She is s/p hysterectomy for nonmalignant reasons,  No pelvic was done today.   Hyperlipidemia New ACC guidelines recommend starting patients aged 54 or higher on moderate intensity statin therapy for LDL between 70-189 and 10 yr risk of CAD > 7.5% ;  and high intensity therapy for anyone with LDL > 190. Advised her to contineu statin therapy Lab Results  Component Value Date   CHOL 264* 10/04/2013   HDL 41.60 10/04/2013   LDLCALC 191* 10/04/2013   LDLDIRECT 147.8 09/29/2012   TRIG 158.0* 10/04/2013   CHOLHDL 6 10/04/2013     Carotid bruit present Noted on right side today.  Referring for carotid dopplers.  Will advise treatment of lipids with statin and asa.   Annual physical exam Annual comprehensive exam was done including breast,excluding pelvic and PAP smear. All screenings have been addressed .   Chronic  insomnia Trial of melatonin advised .   Cerumen impaction She will return for irrigation of impacted ear canal    Updated Medication List Outpatient Encounter Prescriptions as of 10/04/2013  Medication Sig  . amLODipine (NORVASC) 5 MG tablet Take 1 tablet (5 mg total) by mouth daily.  Marland Kitchen aspirin 325 MG tablet Take 325 mg by mouth daily.    . calcium carbonate (OS-CAL) 600 MG TABS Take 600 mg by mouth 2 (two) times daily with a meal.    . Calcium Carbonate-Vitamin D (CALCIUM + D PO) Take by mouth.  . Multiple Vitamin (MULTIVITAMIN) tablet Take 1 tablet by mouth daily.    . [DISCONTINUED] amLODipine (NORVASC) 5 MG tablet Take 1 tablet (5 mg total) by mouth daily.  . [DISCONTINUED] ibuprofen (ADVIL,MOTRIN) 200 MG tablet Take 200 mg by mouth every 6 (six) hours as needed.    . cyclobenzaprine (FLEXERIL) 5 MG tablet Take 1 tablet (5 mg total) by mouth 3 (three) times daily as needed for muscle spasms.  . Melatonin-Tryptophan 3-100 MG CAPS Take 1 capsule by mouth daily with supper.  . meloxicam (MOBIC) 15 MG tablet Take  1 tablet (15 mg total) by mouth daily.  Marland Kitchen triamterene-hydrochlorothiazide (MAXZIDE-25) 37.5-25 MG per tablet Take 1 tablet by mouth daily.  . [DISCONTINUED] meloxicam (MOBIC) 15 MG tablet Take 1 tablet (15 mg total) by mouth daily.  . [DISCONTINUED] meloxicam (MOBIC) 15 MG tablet Take 1 tablet (15 mg total) by mouth daily.  . [DISCONTINUED] triamterene-hydrochlorothiazide (MAXZIDE-25) 37.5-25 MG per tablet Take 1 each (1 tablet total) by mouth daily.

## 2013-10-04 NOTE — Patient Instructions (Signed)
You had your annual Medicare wellness exam today  We will schedule your mammogram, your carotid ultrasound  and your bone density test soon.   You received the pneumonia vaccine today.  We will contact you with the bloodwork results  You can try meloxicam once daily instead of ibuprofen for your back strain,   Melatonin tablet after dinner for insomnia  Return thursday afternoon to have your right ear cleaned

## 2013-10-05 ENCOUNTER — Ambulatory Visit: Payer: Medicare Other | Admitting: *Deleted

## 2013-10-05 ENCOUNTER — Encounter: Payer: Self-pay | Admitting: Emergency Medicine

## 2013-10-05 VITALS — BP 148/80 | HR 64 | Resp 16

## 2013-10-05 DIAGNOSIS — H6120 Impacted cerumen, unspecified ear: Secondary | ICD-10-CM | POA: Insufficient documentation

## 2013-10-05 DIAGNOSIS — F5104 Psychophysiologic insomnia: Secondary | ICD-10-CM | POA: Insufficient documentation

## 2013-10-05 LAB — HEPATITIS C ANTIBODY: HCV Ab: NEGATIVE

## 2013-10-05 LAB — VITAMIN D 25 HYDROXY (VIT D DEFICIENCY, FRACTURES): VIT D 25 HYDROXY: 44 ng/mL (ref 30–89)

## 2013-10-05 NOTE — Assessment & Plan Note (Signed)
Noted on right side today.  Referring for carotid dopplers.  Will advise treatment of lipids with statin and asa.

## 2013-10-05 NOTE — Assessment & Plan Note (Signed)
She is s/p hysterectomy for nonmalignant reasons,  No pelvic was done today.

## 2013-10-05 NOTE — Assessment & Plan Note (Signed)
Annual comprehensive exam was done including breast, excluding pelvic and PAP smear. All screenings have been addressed .  

## 2013-10-05 NOTE — Assessment & Plan Note (Addendum)
New ACC guidelines recommend starting patients aged 75 or higher on moderate intensity statin therapy for LDL between 70-189 and 10 yr risk of CAD > 7.5% ;  and high intensity therapy for anyone with LDL > 190. Advised her to considier therapy Lab Results  Component Value Date   CHOL 264* 10/04/2013   HDL 41.60 10/04/2013   LDLCALC 191* 10/04/2013   LDLDIRECT 147.8 09/29/2012   TRIG 158.0* 10/04/2013   CHOLHDL 6 10/04/2013

## 2013-10-05 NOTE — Assessment & Plan Note (Signed)
She will return for irrigation of impacted ear canal

## 2013-10-05 NOTE — Assessment & Plan Note (Signed)
Well controlled on current regimen. Renal function stable, no changes today.  Lab Results  Component Value Date   CREATININE 1.0 10/04/2013   Lab Results  Component Value Date   NA 138 10/04/2013   K 3.7 10/04/2013   CL 98 10/04/2013   CO2 27 10/04/2013

## 2013-10-05 NOTE — Assessment & Plan Note (Signed)
Trial of melatonin advised .

## 2013-10-07 ENCOUNTER — Ambulatory Visit: Payer: Self-pay | Admitting: Internal Medicine

## 2013-10-07 DIAGNOSIS — I6529 Occlusion and stenosis of unspecified carotid artery: Secondary | ICD-10-CM | POA: Diagnosis not present

## 2013-10-07 DIAGNOSIS — I658 Occlusion and stenosis of other precerebral arteries: Secondary | ICD-10-CM | POA: Diagnosis not present

## 2013-10-07 DIAGNOSIS — R0989 Other specified symptoms and signs involving the circulatory and respiratory systems: Secondary | ICD-10-CM | POA: Diagnosis not present

## 2013-10-07 MED ORDER — SIMVASTATIN 20 MG PO TABS: 20.0000 mg | ORAL_TABLET | Freq: Every day | ORAL | Status: DC

## 2013-10-07 NOTE — Addendum Note (Signed)
Addended by: Crecencio Mc on: 10/07/2013 07:00 AM   Modules accepted: Orders

## 2013-10-11 ENCOUNTER — Telehealth: Payer: Self-pay | Admitting: Internal Medicine

## 2013-10-11 DIAGNOSIS — R0989 Other specified symptoms and signs involving the circulatory and respiratory systems: Secondary | ICD-10-CM

## 2013-10-11 NOTE — Telephone Encounter (Signed)
Left detailed message on home phone per DPR. Also advised patient to return call with any questions.

## 2013-10-11 NOTE — Telephone Encounter (Signed)
Her carotid dopplers showed atherosclerosis bilaterally,  Right greater than left , less than 50% bilaterally .  This should be managed medically with asa  and cholesterol lowering medication , which she has agreed to take.   The ultrasound should be repeated annually

## 2013-10-11 NOTE — Assessment & Plan Note (Signed)
Her carotid dopplers showed atherosclerosis bilaterally,  Right greater than left , less than 50% bilaterally .  This should be managed medically with asa  and cholesterol lowering medication , which she has agreed to take.

## 2013-10-18 ENCOUNTER — Telehealth: Payer: Self-pay | Admitting: Internal Medicine

## 2013-10-18 NOTE — Telephone Encounter (Signed)
Pt came in today stating she lost her amlodipine med.  She stated she has looked everywhere for her meds.  She stated [facility] they told her it would be @$133 to replace her med.  She wanted to know what else she could do.  She stated she couldn't pay $133 to replace  Please advise pt

## 2013-10-18 NOTE — Telephone Encounter (Signed)
Left message for patient to return call to office. 

## 2013-10-19 NOTE — Telephone Encounter (Signed)
Patient found medication stared in different bottle than normally dispensed in advised patient to always read label before taking medication.

## 2013-10-25 DIAGNOSIS — E2839 Other primary ovarian failure: Secondary | ICD-10-CM | POA: Diagnosis not present

## 2013-10-25 DIAGNOSIS — R922 Inconclusive mammogram: Secondary | ICD-10-CM | POA: Diagnosis not present

## 2013-10-25 DIAGNOSIS — Z1231 Encounter for screening mammogram for malignant neoplasm of breast: Secondary | ICD-10-CM | POA: Diagnosis not present

## 2013-10-25 LAB — HM DEXA SCAN: HM Dexa Scan: NORMAL

## 2013-10-29 ENCOUNTER — Encounter: Payer: Self-pay | Admitting: *Deleted

## 2013-10-29 ENCOUNTER — Telehealth: Payer: Self-pay | Admitting: Internal Medicine

## 2013-10-29 NOTE — Telephone Encounter (Signed)
Letter mailed to patient with results.

## 2013-10-29 NOTE — Telephone Encounter (Signed)
Her bone density test was normal. Repeat in 5 years

## 2013-11-05 ENCOUNTER — Encounter: Payer: Self-pay | Admitting: Internal Medicine

## 2013-11-17 ENCOUNTER — Other Ambulatory Visit (INDEPENDENT_AMBULATORY_CARE_PROVIDER_SITE_OTHER): Payer: Medicare Other

## 2013-11-17 ENCOUNTER — Encounter: Payer: Self-pay | Admitting: Internal Medicine

## 2013-11-17 DIAGNOSIS — E785 Hyperlipidemia, unspecified: Secondary | ICD-10-CM

## 2013-11-17 DIAGNOSIS — E876 Hypokalemia: Secondary | ICD-10-CM

## 2013-11-17 DIAGNOSIS — Z79899 Other long term (current) drug therapy: Secondary | ICD-10-CM | POA: Diagnosis not present

## 2013-11-17 LAB — LIPID PANEL
Cholesterol: 178 mg/dL (ref 0–200)
HDL: 41.6 mg/dL (ref >39.00)
LDL Cholesterol: 97 mg/dL (ref 0–99)
Total CHOL/HDL Ratio: 4
Triglycerides: 196 mg/dL — ABNORMAL HIGH (ref 0.0–149.0)
VLDL: 39.2 mg/dL (ref 0.0–40.0)

## 2013-11-17 LAB — COMPREHENSIVE METABOLIC PANEL
ALK PHOS: 56 U/L (ref 39–117)
ALT: 14 U/L (ref 0–35)
AST: 23 U/L (ref 0–37)
Albumin: 3.8 g/dL (ref 3.5–5.2)
BILIRUBIN TOTAL: 1 mg/dL (ref 0.2–1.2)
BUN: 16 mg/dL (ref 6–23)
CO2: 29 mEq/L (ref 19–32)
Calcium: 9.5 mg/dL (ref 8.4–10.5)
Chloride: 102 mEq/L (ref 96–112)
Creatinine, Ser: 1.1 mg/dL (ref 0.4–1.2)
GFR: 52.62 mL/min — ABNORMAL LOW (ref >60.00)
Glucose, Bld: 96 mg/dL (ref 70–99)
Potassium: 3.4 mEq/L — ABNORMAL LOW (ref 3.5–5.1)
SODIUM: 140 meq/L (ref 135–145)
TOTAL PROTEIN: 6.5 g/dL (ref 6.0–8.3)

## 2013-11-19 DIAGNOSIS — E876 Hypokalemia: Secondary | ICD-10-CM | POA: Insufficient documentation

## 2013-11-19 MED ORDER — POTASSIUM CHLORIDE CRYS ER 20 MEQ PO TBCR: 20.0000 meq | EXTENDED_RELEASE_TABLET | Freq: Every day | ORAL | Status: DC

## 2013-11-19 NOTE — Addendum Note (Signed)
Addended by: Crecencio Mc on: 11/19/2013 01:19 PM   Modules accepted: Orders

## 2013-11-22 ENCOUNTER — Encounter: Payer: Self-pay | Admitting: *Deleted

## 2014-01-05 ENCOUNTER — Encounter: Payer: Self-pay | Admitting: Internal Medicine

## 2014-01-05 ENCOUNTER — Ambulatory Visit (INDEPENDENT_AMBULATORY_CARE_PROVIDER_SITE_OTHER): Payer: Medicare Other | Admitting: Internal Medicine

## 2014-01-05 VITALS — BP 152/82 | HR 66 | Temp 98.5°F | Resp 14 | Ht 61.0 in | Wt 131.5 lb

## 2014-01-05 DIAGNOSIS — S81809A Unspecified open wound, unspecified lower leg, initial encounter: Secondary | ICD-10-CM | POA: Diagnosis not present

## 2014-01-05 DIAGNOSIS — S81801A Unspecified open wound, right lower leg, initial encounter: Secondary | ICD-10-CM

## 2014-01-05 DIAGNOSIS — S81009A Unspecified open wound, unspecified knee, initial encounter: Secondary | ICD-10-CM

## 2014-01-05 DIAGNOSIS — S91009A Unspecified open wound, unspecified ankle, initial encounter: Secondary | ICD-10-CM

## 2014-01-05 MED ORDER — CEPHALEXIN 500 MG PO CAPS: 500.0000 mg | ORAL_CAPSULE | Freq: Three times a day (TID) | ORAL | Status: DC

## 2014-01-05 NOTE — Progress Notes (Signed)
Patient ID: Pam Keller, female   DOB: 08-Jul-1940, 73 y.o.   MRN: [id number]   Patient Active Problem List   Diagnosis Date Noted  . Avulsion of skin of right lower leg 01/08/2014  . Hypokalemia 11/19/2013  . Chronic insomnia 10/05/2013  . Cerumen impaction 10/05/2013  . Carotid bruit present 10/04/2013  . Occipital headache 09/29/2012  . Neck stiffness 09/08/2012  . Screening for colon cancer 09/29/2011  . Annual physical exam 09/27/2011  . Hyperlipidemia   . Hypertension   . Pulmonary nodule   . Basal cell carcinoma   . Screening for breast cancer 04/29/2011  . Screening for cervical cancer 04/29/2011  . History of postmenopausal HRT 04/29/2011  . Microscopic hematuria     Subjective:  CC:   Chief Complaint  Patient presents with  . left shin wound    Injured last Friday, has been applying Neosporin and keeping covered.     HPI:   Pam Keller is a 73 y.o. female who presents  A  walk in patient for evaluation and treatment of a traumatic skin avulsion on her lower extremity.. Patient reports that the injury occurred 5 days ago when she tripped  Over a cement block in her yard and scraped her right shin.  She has been applying Neosporin to the open wound daily and avoiding getting it wet while bathing by hanging her leg out of the shower.  She oted some redness  To the skin surrounding the wound and became concerned that it was infected.  She denies fevers and purulent drainage. She is up to date on Tetanus booster.  ,     Past Medical History  Diagnosis Date  . Microscopic hematuria 2010    negative workuo  . Hyperlipidemia   . Hypertension   . Pulmonary nodule   . Basal cell carcinoma     Past Surgical History  Procedure Laterality Date  . Macular surgery      [facility], repaired  . Mohs surgery  2012    for basal cell, forehead  . Appendectomy  1955    for rupture appx  . Oophorectomy  1960  . Abdominal hysterectomy  1984    menorrhagia  . Breast  biopsy      benign  . Trigger finger release  2004    [facility]  . Carpal tunnel release         The following portions of the patient's history were reviewed and updated as appropriate: Allergies, current medications, and problem list.    Review of Systems:   Patient denies headache, fevers, malaise, unintentional weight loss, skin rash, eye pain, sinus congestion and sinus pain, sore throat, dysphagia,  hemoptysis , cough, dyspnea, wheezing, chest pain, palpitations, orthopnea, edema, abdominal pain, nausea, melena, diarrhea, constipation, flank pain, dysuria, hematuria, urinary  Frequency, nocturia, numbness, tingling, seizures,  Focal weakness, Loss of consciousness,  Tremor, insomnia, depression, anxiety, and suicidal ideation.     History   Social History  . Marital Status: Widowed    Spouse Name: Pam Keller    Number of Children: N/A  . Years of Education: N/A   Occupational History  . Not on file.   Social History Main Topics  . Smoking status: Former Smoker    Types: Cigarettes    Quit date: 04/29/2007  . Smokeless tobacco: Never Used  . Alcohol Use: Yes     Comment: Occasionally  . Drug Use: No  . Sexual Activity: Not on file   Other Topics Concern  .  Not on file   Social History Narrative   Retired Tour manager          Objective:  Filed Vitals:   01/05/14 0844  BP: 152/82  Pulse: 66  Temp: 98.5 F (36.9 C)  Resp: 14     General appearance: alert, cooperative and appears stated age Back: symmetric, no curvature. ROM normal. No CVA tenderness. Lungs: clear to auscultation bilaterally Heart: regular rate and rhythm, S1, S2 normal, no murmur, click, rub or gallop Skin: 4 cm avulsion of skin on right with necrotic tissue adherent to granulating bed .  Erythema in a band noted to surround wound. Skin is warm. No purulent drainage.  Lymph nodes: Cervical, supraclavicular, and axillary nodes normal.  Assessment and Plan:  Avulsion of skin of right lower  leg With necrotic skin and some erythem noted in a badn aroudn leg. Debridement of devitalized skin was done after informed consent was obtained.  Wound was irrigated,  nonstck telfa pad and wrapped with gauze. Care instructions given.  Empiric keflex,  Return in one week.    Updated Medication List Outpatient Encounter Prescriptions as of 01/05/2014  Medication Sig  . amLODipine (NORVASC) 5 MG tablet Take 1 tablet (5 mg total) by mouth daily.  Marland Kitchen aspirin 325 MG tablet Take 325 mg by mouth daily.    . Calcium Carbonate-Vitamin D (CALCIUM + D PO) Take by mouth.  . Multiple Vitamin (MULTIVITAMIN) tablet Take 1 tablet by mouth daily.    . potassium chloride SA (K-DUR,KLOR-CON) 20 MEQ tablet Take 1 tablet (20 mEq total) by mouth daily.  . simvastatin (ZOCOR) 20 MG tablet Take 1 tablet (20 mg total) by mouth at bedtime.  . triamterene-hydrochlorothiazide (MAXZIDE-25) 37.5-25 MG per tablet Take 1 tablet by mouth daily.  . [DISCONTINUED] calcium carbonate (OS-CAL) 600 MG TABS Take 600 mg by mouth 2 (two) times daily with a meal.    . [DISCONTINUED] cyclobenzaprine (FLEXERIL) 5 MG tablet Take 1 tablet (5 mg total) by mouth 3 (three) times daily as needed for muscle spasms.  . [DISCONTINUED] Melatonin-Tryptophan 3-100 MG CAPS Take 1 capsule by mouth daily with supper.  . [DISCONTINUED] meloxicam (MOBIC) 15 MG tablet Take 1 tablet (15 mg total) by mouth daily.  . cephALEXin (KEFLEX) 500 MG capsule Take 1 capsule (500 mg total) by mouth 3 (three) times daily.     No orders of the defined types were placed in this encounter.    Return in about 1 week (around 01/12/2014).

## 2014-01-05 NOTE — Patient Instructions (Addendum)
Keep your woud covered with a non stick pad andgauze wrap  Change once daily or whenever it gets wet.  Do not let the shower water , pool or ocean water contaminate the wound   Clean the wound ONLY with sterile water or sterile saline solution  You do not need any more Neosporin  Keflex antibiotic to prevent infection.  Stop the medication if it makes you itchy  Return in one week for a recheck.  Your last testanus  shot was 2012,  You are up to date

## 2014-01-05 NOTE — Progress Notes (Signed)
Pre visit review using our clinic review tool, if applicable. No additional management support is needed unless otherwise documented below in the visit note. 

## 2014-01-08 DIAGNOSIS — S81801A Unspecified open wound, right lower leg, initial encounter: Secondary | ICD-10-CM | POA: Insufficient documentation

## 2014-01-08 NOTE — Assessment & Plan Note (Signed)
With necrotic skin and some erythem noted in a badn aroudn leg. Debridement of devitalized skin was done after informed consent was obtained.  Wound was irrigated,  nonstck telfa pad and wrapped with gauze. Care instructions given.  Empiric keflex,  Return in one week.

## 2014-01-12 ENCOUNTER — Ambulatory Visit: Payer: Medicare Other | Admitting: Internal Medicine

## 2014-03-02 ENCOUNTER — Ambulatory Visit (INDEPENDENT_AMBULATORY_CARE_PROVIDER_SITE_OTHER): Payer: Medicare Other | Admitting: Family Medicine

## 2014-03-02 ENCOUNTER — Encounter: Payer: Self-pay | Admitting: Family Medicine

## 2014-03-02 VITALS — BP 130/70 | HR 105 | Temp 99.4°F | Wt 132.0 lb

## 2014-03-02 DIAGNOSIS — N3000 Acute cystitis without hematuria: Secondary | ICD-10-CM

## 2014-03-02 DIAGNOSIS — N3001 Acute cystitis with hematuria: Secondary | ICD-10-CM

## 2014-03-02 DIAGNOSIS — R300 Dysuria: Secondary | ICD-10-CM | POA: Diagnosis not present

## 2014-03-02 DIAGNOSIS — N390 Urinary tract infection, site not specified: Secondary | ICD-10-CM | POA: Insufficient documentation

## 2014-03-02 LAB — POCT URINALYSIS DIPSTICK
BILIRUBIN UA: NEGATIVE
Glucose, UA: NEGATIVE
KETONES UA: NEGATIVE
Nitrite, UA: NEGATIVE
PH UA: 7
SPEC GRAV UA: 1.015
Urobilinogen, UA: NEGATIVE

## 2014-03-02 MED ORDER — CIPROFLOXACIN HCL 250 MG PO TABS: 250.0000 mg | ORAL_TABLET | Freq: Two times a day (BID) | ORAL | Status: DC

## 2014-03-02 NOTE — Progress Notes (Signed)
Subjective:    Patient ID: Pam Keller, female    DOB: 1941/01/25, 73 y.o.   MRN: [id number]  HPI Here with urinary symptoms   Burning to urinate  Pain in bladder -all the time /even when not urinating  Some frequency  No leaking  Some urgency  Some pink color   No flank pain   Temp 99.4 today Has not felt feverish No n/v   She is drinking a lot of water   She has not had uti in a long time  Results for orders placed in visit on 03/02/14  POCT URINALYSIS DIPSTICK      Result Value Ref Range   Color, UA amber     Clarity, UA cloudy     Glucose, UA neg     Bilirubin, UA neg     Ketones, UA neg     Spec Grav, UA 1.015     Blood, UA 3+     pH, UA 7.0     Protein, UA +-15     Urobilinogen, UA negative     Nitrite, UA neg     Leukocytes, UA small (1+)       Patient Active Problem List   Diagnosis Date Noted  . Avulsion of skin of right lower leg 01/08/2014  . Hypokalemia 11/19/2013  . Chronic insomnia 10/05/2013  . Cerumen impaction 10/05/2013  . Carotid bruit present 10/04/2013  . Occipital headache 09/29/2012  . Neck stiffness 09/08/2012  . Screening for colon cancer 09/29/2011  . Annual physical exam 09/27/2011  . Hyperlipidemia   . Hypertension   . Pulmonary nodule   . Basal cell carcinoma   . Screening for breast cancer 04/29/2011  . Screening for cervical cancer 04/29/2011  . History of postmenopausal HRT 04/29/2011  . Microscopic hematuria    Past Medical History  Diagnosis Date  . Microscopic hematuria 2010    negative workuo  . Hyperlipidemia   . Hypertension   . Pulmonary nodule   . Basal cell carcinoma    Past Surgical History  Procedure Laterality Date  . Macular surgery      [facility], repaired  . Mohs surgery  2012    for basal cell, forehead  . Appendectomy  1955    for rupture appx  . Oophorectomy  1960  . Abdominal hysterectomy  1984    menorrhagia  . Breast biopsy      benign  . Trigger finger release  2004    [facility]    . Carpal tunnel release     History  Substance Use Topics  . Smoking status: Former Smoker    Types: Cigarettes    Quit date: 04/29/2007  . Smokeless tobacco: Never Used  . Alcohol Use: Yes     Comment: Occasionally   Family History  Problem Relation Age of Onset  . Cancer Neg Hx    Allergies  Allergen Reactions  . Clindamycin/Lincomycin   . Codeine   . Demerol   . Penicillins   . Sulfa Antibiotics    Current Outpatient Prescriptions on File Prior to Visit  Medication Sig Dispense Refill  . amLODipine (NORVASC) 5 MG tablet Take 1 tablet (5 mg total) by mouth daily.  90 tablet  1  . aspirin 325 MG tablet Take 325 mg by mouth daily.        . Calcium Carbonate-Vitamin D (CALCIUM + D PO) Take by mouth.      . Multiple Vitamin (MULTIVITAMIN) tablet Take 1  tablet by mouth daily.        . potassium chloride SA (K-DUR,KLOR-CON) 20 MEQ tablet Take 1 tablet (20 mEq total) by mouth daily.  30 tablet  3  . simvastatin (ZOCOR) 20 MG tablet Take 1 tablet (20 mg total) by mouth at bedtime.  90 tablet  3  . triamterene-hydrochlorothiazide (MAXZIDE-25) 37.5-25 MG per tablet Take 1 tablet by mouth daily.  90 tablet  3   No current facility-administered medications on file prior to visit.    Review of Systems Review of Systems  Constitutional: Negative for fever, appetite change, fatigue and unexpected weight change.  Eyes: Negative for pain and visual disturbance.  Respiratory: Negative for cough and shortness of breath.   Cardiovascular: Negative for cp or palpitations    Gastrointestinal: Negative for nausea, diarrhea and constipation.  Genitourinary: pos for urgency and frequency. pos for mild hematuria  , neg for flank pain  Skin: Negative for pallor or rash   Neurological: Negative for weakness, light-headedness, numbness and headaches.  Hematological: Negative for adenopathy. Does not bruise/bleed easily.  Psychiatric/Behavioral: Negative for dysphoric mood. The patient is not  nervous/anxious.         Objective:   Physical Exam  Constitutional: She appears well-developed and well-nourished. No distress.  HENT:  Head: Normocephalic and atraumatic.  Mouth/Throat: Oropharynx is clear and moist.  Eyes: Conjunctivae and EOM are normal. Pupils are equal, round, and reactive to light. Right eye exhibits no discharge. Left eye exhibits no discharge. No scleral icterus.  Neck: Normal range of motion. Neck supple.  Cardiovascular: Normal rate and regular rhythm.   Pulmonary/Chest: Effort normal and breath sounds normal.  Abdominal: Soft. Bowel sounds are normal. She exhibits no distension and no mass. There is tenderness. There is no rebound and no guarding.  Mild suprapubic tenderness No cva tenderness   Lymphadenopathy:    She has no cervical adenopathy.  Neurological: She is alert.  Skin: Skin is warm and dry. No rash noted. No pallor.  Psychiatric: She has a normal mood and affect.          Assessment & Plan:   Problem List Items Addressed This Visit     Genitourinary   UTI (urinary tract infection)     With mild hematuria  Cover with cipro  Enc fluids Handout given  cx sent- will update Update if not starting to improve in several days  or if worsening      Relevant Orders      Urine culture    Other Visit Diagnoses   Dysuria    -  Primary    Relevant Orders       POCT urinalysis dipstick (Completed)

## 2014-03-02 NOTE — Progress Notes (Signed)
Pre visit review using our clinic review tool, if applicable. No additional management support is needed unless otherwise documented below in the visit note. 

## 2014-03-02 NOTE — Assessment & Plan Note (Signed)
With mild hematuria  Cover with cipro  Enc fluids Handout given  cx sent- will update Update if not starting to improve in several days  or if worsening

## 2014-03-02 NOTE — Patient Instructions (Addendum)
Take your cipro (antibiotic) for urinary tract infection Drink lots of water  Update if not starting to improve in a week or if worsening   We will inform you when urine culture returns      Urinary Tract Infection Urinary tract infections (UTIs) can develop anywhere along your urinary tract. Your urinary tract is your body's drainage system for removing wastes and extra water. Your urinary tract includes two kidneys, two ureters, a bladder, and a urethra. Your kidneys are a pair of bean-shaped organs. Each kidney is about the size of your fist. They are located below your ribs, one on each side of your spine. CAUSES Infections are caused by microbes, which are microscopic organisms, including fungi, viruses, and bacteria. These organisms are so small that they can only be seen through a microscope. Bacteria are the microbes that most commonly cause UTIs. SYMPTOMS  Symptoms of UTIs may vary by age and gender of the patient and by the location of the infection. Symptoms in young women typically include a frequent and intense urge to urinate and a painful, burning feeling in the bladder or urethra during urination. Older women and men are more likely to be tired, shaky, and weak and have muscle aches and abdominal pain. A fever may mean the infection is in your kidneys. Other symptoms of a kidney infection include pain in your back or sides below the ribs, nausea, and vomiting. DIAGNOSIS To diagnose a UTI, your caregiver will ask you about your symptoms. Your caregiver also will ask to provide a urine sample. The urine sample will be tested for bacteria and white blood cells. White blood cells are made by your body to help fight infection. TREATMENT  Typically, UTIs can be treated with medication. Because most UTIs are caused by a bacterial infection, they usually can be treated with the use of antibiotics. The choice of antibiotic and length of treatment depend on your symptoms and the type of  bacteria causing your infection. HOME CARE INSTRUCTIONS  If you were prescribed antibiotics, take them exactly as your caregiver instructs you. Finish the medication even if you feel better after you have only taken some of the medication.  Drink enough water and fluids to keep your urine clear or pale yellow.  Avoid caffeine, tea, and carbonated beverages. They tend to irritate your bladder.  Empty your bladder often. Avoid holding urine for long periods of time.  Empty your bladder before and after sexual intercourse.  After a bowel movement, women should cleanse from front to back. Use each tissue only once. SEEK MEDICAL CARE IF:   You have back pain.  You develop a fever.  Your symptoms do not begin to resolve within 3 days. SEEK IMMEDIATE MEDICAL CARE IF:   You have severe back pain or lower abdominal pain.  You develop chills.  You have nausea or vomiting.  You have continued burning or discomfort with urination. MAKE SURE YOU:   Understand these instructions.  Will watch your condition.  Will get help right away if you are not doing well or get worse. Document Released: 03/20/2005 Document Revised: 12/10/2011 Document Reviewed: 07/19/2011 [facility] Patient Information 2015 [place], [place]. This information is not intended to replace advice given to you by your health care provider. Make sure you discuss any questions you have with your health care provider.

## 2014-03-05 LAB — URINE CULTURE: Colony Count: >=100000

## 2014-04-01 ENCOUNTER — Other Ambulatory Visit: Payer: Self-pay | Admitting: Internal Medicine

## 2014-04-28 DIAGNOSIS — H35349 Macular cyst, hole, or pseudohole, unspecified eye: Secondary | ICD-10-CM | POA: Diagnosis not present

## 2014-05-05 ENCOUNTER — Telehealth: Payer: Self-pay | Admitting: Internal Medicine

## 2014-05-05 DIAGNOSIS — Z1239 Encounter for other screening for malignant neoplasm of breast: Secondary | ICD-10-CM

## 2014-05-05 NOTE — Telephone Encounter (Signed)
Pam Keller would like a referral to be sent to [facility] so she can go ahead and schedule her mammogram in May or after. Please call the pt if needed. Thank you.

## 2014-05-05 NOTE — Telephone Encounter (Signed)
Referral is in process as requested 

## 2014-06-15 DIAGNOSIS — X32XXXA Exposure to sunlight, initial encounter: Secondary | ICD-10-CM | POA: Diagnosis not present

## 2014-06-15 DIAGNOSIS — L821 Other seborrheic keratosis: Secondary | ICD-10-CM | POA: Diagnosis not present

## 2014-06-15 DIAGNOSIS — D1801 Hemangioma of skin and subcutaneous tissue: Secondary | ICD-10-CM | POA: Diagnosis not present

## 2014-06-15 DIAGNOSIS — L853 Xerosis cutis: Secondary | ICD-10-CM | POA: Diagnosis not present

## 2014-10-05 ENCOUNTER — Other Ambulatory Visit: Payer: Self-pay | Admitting: *Deleted

## 2014-10-05 MED ORDER — AMLODIPINE BESYLATE 5 MG PO TABS: 5.0000 mg | ORAL_TABLET | Freq: Every day | ORAL | Status: DC

## 2014-10-06 ENCOUNTER — Encounter: Payer: Self-pay | Admitting: Internal Medicine

## 2014-10-06 ENCOUNTER — Ambulatory Visit (INDEPENDENT_AMBULATORY_CARE_PROVIDER_SITE_OTHER): Payer: Medicare Other | Admitting: Internal Medicine

## 2014-10-06 ENCOUNTER — Other Ambulatory Visit: Payer: Self-pay | Admitting: Internal Medicine

## 2014-10-06 VITALS — BP 142/72 | HR 73 | Temp 97.9°F | Resp 14 | Ht 62.0 in | Wt 135.5 lb

## 2014-10-06 DIAGNOSIS — Z0000 Encounter for general adult medical examination without abnormal findings: Secondary | ICD-10-CM | POA: Diagnosis not present

## 2014-10-06 DIAGNOSIS — E785 Hyperlipidemia, unspecified: Secondary | ICD-10-CM | POA: Diagnosis not present

## 2014-10-06 DIAGNOSIS — Z1159 Encounter for screening for other viral diseases: Secondary | ICD-10-CM | POA: Diagnosis not present

## 2014-10-06 DIAGNOSIS — Z79899 Other long term (current) drug therapy: Secondary | ICD-10-CM | POA: Diagnosis not present

## 2014-10-06 DIAGNOSIS — L853 Xerosis cutis: Secondary | ICD-10-CM

## 2014-10-06 DIAGNOSIS — I10 Essential (primary) hypertension: Secondary | ICD-10-CM | POA: Diagnosis not present

## 2014-10-06 DIAGNOSIS — R5383 Other fatigue: Secondary | ICD-10-CM

## 2014-10-06 DIAGNOSIS — Z1239 Encounter for other screening for malignant neoplasm of breast: Secondary | ICD-10-CM

## 2014-10-06 DIAGNOSIS — E559 Vitamin D deficiency, unspecified: Secondary | ICD-10-CM | POA: Diagnosis not present

## 2014-10-06 DIAGNOSIS — C4491 Basal cell carcinoma of skin, unspecified: Secondary | ICD-10-CM | POA: Diagnosis not present

## 2014-10-06 LAB — COMPREHENSIVE METABOLIC PANEL
ALT: 13 U/L (ref 0–35)
AST: 21 U/L (ref 0–37)
Albumin: 4.1 g/dL (ref 3.5–5.2)
Alkaline Phosphatase: 74 U/L (ref 39–117)
BUN: 16 mg/dL (ref 6–23)
CO2: 32 meq/L (ref 19–32)
Calcium: 9.7 mg/dL (ref 8.4–10.5)
Chloride: 100 mEq/L (ref 96–112)
Creatinine, Ser: 0.86 mg/dL (ref 0.40–1.20)
GFR: 68.28 mL/min (ref >60.00)
GLUCOSE: 87 mg/dL (ref 70–99)
POTASSIUM: 3.8 meq/L (ref 3.5–5.1)
Sodium: 139 mEq/L (ref 135–145)
Total Bilirubin: 0.7 mg/dL (ref 0.2–1.2)
Total Protein: 6.8 g/dL (ref 6.0–8.3)

## 2014-10-06 LAB — TSH: TSH: 1.53 u[IU]/mL (ref 0.35–4.50)

## 2014-10-06 LAB — LIPID PANEL
CHOLESTEROL: 201 mg/dL — AB (ref 0–200)
HDL: 42.9 mg/dL (ref >39.00)
NonHDL: 158.1
Total CHOL/HDL Ratio: 5
Triglycerides: 251 mg/dL — ABNORMAL HIGH (ref 0.0–149.0)
VLDL: 50.2 mg/dL — ABNORMAL HIGH (ref 0.0–40.0)

## 2014-10-06 LAB — VITAMIN D 25 HYDROXY (VIT D DEFICIENCY, FRACTURES): VITD: 28.9 ng/mL — AB (ref 30.00–100.00)

## 2014-10-06 LAB — LDL CHOLESTEROL, DIRECT: LDL DIRECT: 112 mg/dL

## 2014-10-06 NOTE — Progress Notes (Signed)
Pre-visit discussion using our clinic review tool. No additional management support is needed unless otherwise documented below in the visit note.  

## 2014-10-06 NOTE — Patient Instructions (Signed)
Tyr using Collyrium eye wash for your left eye irritation   Try Eucerin or Aveeno moisturizer with colloidal oatmeal for your dry skin  Make sure you get 3 16 ounce servings of water daily   Referral to Dr Kellie Moor (dermatology),  And mammogram is in process   I recommend getting the majority of your calcium and Vitamin D  through diet rather than supplements given the recent association of calcium supplements with increased coronary artery calcium scores  Try the Silk almond Light,  Original formula.  Delicious,  Low carb,  Low cal,  Cholesterol free    Health Maintenance Adopting a healthy lifestyle and getting preventive care can go a long way to promote health and wellness. Talk with your health care provider about what schedule of regular examinations is right for you. This is a good chance for you to check in with your provider about disease prevention and staying healthy. In between checkups, there are plenty of things you can do on your own. Experts have done a lot of research about which lifestyle changes and preventive measures are most likely to keep you healthy. Ask your health care provider for more information. WEIGHT AND DIET  Eat a healthy diet  Be sure to include plenty of vegetables, fruits, low-fat dairy products, and lean protein.  Do not eat a lot of foods high in solid fats, added sugars, or salt.  Get regular exercise. This is one of the most important things you can do for your health.  Most adults should exercise for at least 150 minutes each week. The exercise should increase your heart rate and make you sweat (moderate-intensity exercise).  Most adults should also do strengthening exercises at least twice a week. This is in addition to the moderate-intensity exercise.  Maintain a healthy weight  Body mass index (BMI) is a measurement that can be used to identify possible weight problems. It estimates body fat based on height and weight. Your health care  provider can help determine your BMI and help you achieve or maintain a healthy weight.  For females 40 years of age and older:   A BMI below 18.5 is considered underweight.  A BMI of 18.5 to 24.9 is normal.  A BMI of 25 to 29.9 is considered overweight.  A BMI of 30 and above is considered obese.  Watch levels of cholesterol and blood lipids  You should start having your blood tested for lipids and cholesterol at 76 years of age, then have this test every 5 years.  You may need to have your cholesterol levels checked more often if:  Your lipid or cholesterol levels are high.  You are older than 76 years of age.  You are at high risk for heart disease.  CANCER SCREENING   Lung Cancer  Lung cancer screening is recommended for adults 36-72 years old who are at high risk for lung cancer because of a history of smoking.  A yearly low-dose CT scan of the lungs is recommended for people who:  Currently smoke.  Have quit within the past 15 years.  Have at least a 30-pack-year history of smoking. A pack year is smoking an average of one pack of cigarettes a day for 1 year.  Yearly screening should continue until it has been 15 years since you quit.  Yearly screening should stop if you develop a health problem that would prevent you from having lung cancer treatment.  Breast Cancer  Practice breast self-awareness. This means understanding  how your breasts normally appear and feel.  It also means doing regular breast self-exams. Let your health care provider know about any changes, no matter how small.  If you are in your 20s or 30s, you should have a clinical breast exam (CBE) by a health care provider every 1-3 years as part of a regular health exam.  If you are 55 or older, have a CBE every year. Also consider having a breast X-ray (mammogram) every year.  If you have a family history of breast cancer, talk to your health care provider about genetic screening.  If you  are at high risk for breast cancer, talk to your health care provider about having an MRI and a mammogram every year.  Breast cancer gene (BRCA) assessment is recommended for women who have family members with BRCA-related cancers. BRCA-related cancers include:  Breast.  Ovarian.  Tubal.  Peritoneal cancers.  Results of the assessment will determine the need for genetic counseling and BRCA1 and BRCA2 testing. Cervical Cancer Routine pelvic examinations to screen for cervical cancer are no longer recommended for nonpregnant women who are considered low risk for cancer of the pelvic organs (ovaries, uterus, and vagina) and who do not have symptoms. A pelvic examination may be necessary if you have symptoms including those associated with pelvic infections. Ask your health care provider if a screening pelvic exam is right for you.   The Pap test is the screening test for cervical cancer for women who are considered at risk.  If you had a hysterectomy for a problem that was not cancer or a condition that could lead to cancer, then you no longer need Pap tests.  If you are older than 65 years, and you have had normal Pap tests for the past 10 years, you no longer need to have Pap tests.  If you have had past treatment for cervical cancer or a condition that could lead to cancer, you need Pap tests and screening for cancer for at least 20 years after your treatment.  If you no longer get a Pap test, assess your risk factors if they change (such as having a new sexual partner). This can affect whether you should start being screened again.  Some women have medical problems that increase their chance of getting cervical cancer. If this is the case for you, your health care provider may recommend more frequent screening and Pap tests.  The human papillomavirus (HPV) test is another test that may be used for cervical cancer screening. The HPV test looks for the virus that can cause cell changes in  the cervix. The cells collected during the Pap test can be tested for HPV.  The HPV test can be used to screen women 75 years of age and older. Getting tested for HPV can extend the interval between normal Pap tests from three to five years.  An HPV test also should be used to screen women of any age who have unclear Pap test results.  After 76 years of age, women should have HPV testing as often as Pap tests.  Colorectal Cancer  This type of cancer can be detected and often prevented.  Routine colorectal cancer screening usually begins at 76 years of age and continues through 76 years of age.  Your health care provider may recommend screening at an earlier age if you have risk factors for colon cancer.  Your health care provider may also recommend using home test kits to check for hidden blood in  the stool.  A small camera at the end of a tube can be used to examine your colon directly (sigmoidoscopy or colonoscopy). This is done to check for the earliest forms of colorectal cancer.  Routine screening usually begins at age 30.  Direct examination of the colon should be repeated every 5-10 years through 76 years of age. However, you may need to be screened more often if early forms of precancerous polyps or small growths are found. Skin Cancer  Check your skin from head to toe regularly.  Tell your health care provider about any new moles or changes in moles, especially if there is a change in a mole's shape or color.  Also tell your health care provider if you have a mole that is larger than the size of a pencil eraser.  Always use sunscreen. Apply sunscreen liberally and repeatedly throughout the day.  Protect yourself by wearing long sleeves, pants, a wide-brimmed hat, and sunglasses whenever you are outside. HEART DISEASE, DIABETES, AND HIGH BLOOD PRESSURE   Have your blood pressure checked at least every 1-2 years. High blood pressure causes heart disease and increases the  risk of stroke.  If you are between 97 years and 13 years old, ask your health care provider if you should take aspirin to prevent strokes.  Have regular diabetes screenings. This involves taking a blood sample to check your fasting blood sugar level.  If you are at a normal weight and have a low risk for diabetes, have this test once every three years after 76 years of age.  If you are overweight and have a high risk for diabetes, consider being tested at a younger age or more often. PREVENTING INFECTION  Hepatitis B  If you have a higher risk for hepatitis B, you should be screened for this virus. You are considered at high risk for hepatitis B if:  You were born in a country where hepatitis B is common. Ask your health care provider which countries are considered high risk.  Your parents were born in a high-risk country, and you have not been immunized against hepatitis B (hepatitis B vaccine).  You have HIV or AIDS.  You use needles to inject street drugs.  You live with someone who has hepatitis B.  You have had sex with someone who has hepatitis B.  You get hemodialysis treatment.  You take certain medicines for conditions, including cancer, organ transplantation, and autoimmune conditions. Hepatitis C  Blood testing is recommended for:  Everyone born from [birth year] through 1967.  Anyone with known risk factors for hepatitis C. Sexually transmitted infections (STIs)  You should be screened for sexually transmitted infections (STIs) including gonorrhea and chlamydia if:  You are sexually active and are younger than 76 years of age.  You are older than 76 years of age and your health care provider tells you that you are at risk for this type of infection.  Your sexual activity has changed since you were last screened and you are at an increased risk for chlamydia or gonorrhea. Ask your health care provider if you are at risk.  If you do not have HIV, but are at risk, it  may be recommended that you take a prescription medicine daily to prevent HIV infection. This is called pre-exposure prophylaxis (PrEP). You are considered at risk if:  You are sexually active and do not regularly use condoms or know the HIV status of your partner(s).  You take drugs by injection.  You  are sexually active with a partner who has HIV. Talk with your health care provider about whether you are at high risk of being infected with HIV. If you choose to begin PrEP, you should first be tested for HIV. You should then be tested every 3 months for as long as you are taking PrEP.  PREGNANCY   If you are premenopausal and you may become pregnant, ask your health care provider about preconception counseling.  If you may become pregnant, take 400 to 800 micrograms (mcg) of folic acid every day.  If you want to prevent pregnancy, talk to your health care provider about birth control (contraception). OSTEOPOROSIS AND MENOPAUSE   Osteoporosis is a disease in which the bones lose minerals and strength with aging. This can result in serious bone fractures. Your risk for osteoporosis can be identified using a bone density scan.  If you are 69 years of age or older, or if you are at risk for osteoporosis and fractures, ask your health care provider if you should be screened.  Ask your health care provider whether you should take a calcium or vitamin D supplement to lower your risk for osteoporosis.  Menopause may have certain physical symptoms and risks.  Hormone replacement therapy may reduce some of these symptoms and risks. Talk to your health care provider about whether hormone replacement therapy is right for you.  HOME CARE INSTRUCTIONS   Schedule regular health, dental, and eye exams.  Stay current with your immunizations.   Do not use any tobacco products including cigarettes, chewing tobacco, or electronic cigarettes.  If you are pregnant, do not drink alcohol.  If you are  breastfeeding, limit how much and how often you drink alcohol.  Limit alcohol intake to no more than 1 drink per day for nonpregnant women. One drink equals 12 ounces of beer, 5 ounces of wine, or 1 ounces of hard liquor.  Do not use street drugs.  Do not share needles.  Ask your health care provider for help if you need support or information about quitting drugs.  Tell your health care provider if you often feel depressed.  Tell your health care provider if you have ever been abused or do not feel safe at home. Document Released: 12/24/2010 Document Revised: 10/25/2013 Document Reviewed: 05/12/2013 [facility] Patient Information 2015 [place], [place]. This information is not intended to replace advice given to you by your health care provider. Make sure you discuss any questions you have with your health care provider.

## 2014-10-06 NOTE — Progress Notes (Signed)
Patient ID: Pam Keller, female   DOB: 04/03/1941, 74 y.o.   MRN: [id number]  The patient is here for annual Medicare wellness examination and management of other chronic and acute problems, including hypertension and hyperlipidemia.    The risk factors are reflected in the social history.  The roster of all physicians providing medical care to patient - is listed in the Snapshot section of the chart.  Activities of daily living:  The patient is 100% independent in all ADLs: dressing, toileting, feeding as well as independent mobility  Home safety : The patient has smoke detectors in the home. They wear seatbelts.  There are no firearms at home. There is no violence in the home.   There is no risks for hepatitis, STDs or HIV. There is no   history of blood transfusion. They have no travel history to infectious disease endemic areas of the world.  The patient has seen their dentist in the last six month. They have seen their eye doctor in the last year. They admit to slight hearing difficulty with regard to whispered voices and some television programs.  They have deferred audiologic testing in the last year.  They do not  have excessive sun exposure. Discussed the need for sun protection: hats, long sleeves and use of sunscreen if there is significant sun exposure.   Diet: the importance of a healthy diet is discussed. They do have a healthy diet.  The benefits of regular aerobic exercise were discussed. She walks 4 times per week ,  20 minutes.   Depression screen: there are no signs or vegative symptoms of depression- irritability, change in appetite, anhedonia, sadness/tearfullness.  Cognitive assessment: the patient manages all their financial and personal affairs and is actively engaged. They could relate day,date,year and events; recalled 2/3 objects at 3 minutes; performed clock-face test normally.  The following portions of the patient's history were reviewed and updated as  appropriate: allergies, current medications, past family history, past medical history,  past surgical history, past social history  and problem list.  Visual acuity was not assessed per patient preference since she has regular follow up with her ophthalmologist. Hearing and body mass index were assessed and reviewed.   During the course of the visit the patient was educated and counseled about appropriate screening and preventive services including : fall prevention , diabetes screening, nutrition counseling, colorectal cancer screening, and recommended immunizations.    Review of Systems  Patient denies headache, fevers, malaise, unintentional weight loss, skin rash, eye pain, sinus congestion and sinus pain, sore throat, dysphagia,  hemoptysis , cough, dyspnea, wheezing, chest pain, palpitations, orthopnea, edema, abdominal pain, nausea, melena, diarrhea, constipation, flank pain, dysuria, hematuria, urinary  Frequency, nocturia, numbness, tingling, seizures,  Focal weakness, Loss of consciousness,  Tremor, insomnia, depression, anxiety, and suicidal ideation.   Objective:  BP 142/72 mmHg  Pulse 73  Temp(Src) 97.9 F (36.6 C) (Oral)  Resp 14  Ht 5\' 2"  (1.575 m)  Wt 135 lb 8 oz (61.462 kg)  BMI 24.78 kg/m2  SpO2 96%  General appearance: alert, cooperative and appears stated age Head: Normocephalic, without obvious abnormality, atraumatic Eyes: conjunctivae/corneas clear. PERRL, EOM's intact. Fundi benign. Ears: normal TM's and external ear canals both ears Nose: Nares normal. Septum midline. Mucosa normal. No drainage or sinus tenderness. Throat: lips, mucosa, and tongue normal; teeth and gums normal Neck: no adenopathy, no carotid bruit, no JVD, supple, symmetrical, trachea midline and thyroid not enlarged, symmetric, no tenderness/mass/nodules Lungs: clear to  auscultation bilaterally Breasts: normal appearance, no masses or tenderness Heart: regular rate and rhythm, S1, S2 normal,  no murmur, click, rub or gallop Abdomen: soft, non-tender; bowel sounds normal; no masses,  no organomegaly Extremities: extremities normal, atraumatic, no cyanosis or edema Pulses: 2+ and symmetric Skin: Skin color, texture, turgor normal. No rashes or lesions Neurologic: Alert and oriented X 3, normal strength and tone. Normal symmetric reflexes. Normal coordination and gait.   Assessment and Plan:  Problem List Items Addressed This Visit      Unprioritized   Screening for breast cancer   Relevant Orders   MM DIGITAL SCREENING BILATERAL   Hyperlipidemia    Patient has not been see in a year for statin management of hyperlipidemia and her last assessment of liver enzymes  was a year ago.  Advised her that [facility] is q 6 month follow up with liver enzymes. Triglycerides are elevated today Recommended a low glycemic index diet,and regular exercise.  Advised to  repeat in 6 months.   Lab Results  Component Value Date   CHOL 223* 07/20/2014   HDL 52.20 07/20/2014   LDLDIRECT 109.0 07/20/2014   TRIG 343.0* 07/20/2014   CHOLHDL 4 07/20/2014   Lab Results  Component Value Date   ALT 20 07/20/2014   AST 24 07/20/2014   ALKPHOS 73 07/20/2014   BILITOT 0.4 07/20/2014       Lab Results  Component Value Date   CHOL 201* 10/06/2014   HDL 42.90 10/06/2014   LDLCALC 97 11/17/2013   LDLDIRECT 112.0 10/06/2014   TRIG 251.0* 10/06/2014   CHOLHDL 5 10/06/2014   Lab Results  Component Value Date   ALT 13 10/06/2014   AST 21 10/06/2014   ALKPHOS 74 10/06/2014   BILITOT 0.7 10/06/2014         Relevant Orders   Lipid panel (Completed)   Hypertension - Primary    Patient has not been see in a year for hypertension follow up and her last assessment of renal function was a year ago.  Advised her that [facility] is q 6 month follow up with renal function assessment.  BP is slightly elevated today but er home readings have been 130/80 or lower.  Well controlled on current regimen. Renal function  stable, no changes today.  Lab Results  Component Value Date   CREATININE 0.86 10/06/2014   Lab Results  Component Value Date   NA 139 10/06/2014   K 3.8 10/06/2014   CL 100 10/06/2014   CO2 32 10/06/2014         Basal cell carcinoma    Referral to Dr Kellie Moor for annual follow up.       Relevant Orders   Ambulatory referral to Dermatology   Medicare annual wellness visit, subsequent   Long-term use of high-risk medication   Relevant Orders   Comprehensive metabolic panel (Completed)    Other Visit Diagnoses    Vitamin D deficiency        Relevant Orders    Vit D  25 hydroxy (rtn osteoporosis monitoring) (Completed)    Need for hepatitis C screening test        Relevant Orders    Hepatitis C antibody (Completed)    Dry skin        Other fatigue        Relevant Orders    TSH (Completed)

## 2014-10-07 LAB — HEPATITIS C ANTIBODY: HCV Ab: NEGATIVE

## 2014-10-09 ENCOUNTER — Encounter: Payer: Self-pay | Admitting: Internal Medicine

## 2014-10-09 NOTE — Addendum Note (Signed)
Addended by: Crecencio Mc on: 10/09/2014 02:27 PM   Modules accepted: Level of Service

## 2014-10-09 NOTE — Assessment & Plan Note (Signed)

## 2014-10-09 NOTE — Assessment & Plan Note (Signed)
Patient has not been see in a year for hypertension follow up and her last assessment of renal function was a year ago.  Advised her that [facility] is q 6 month follow up with renal function assessment.  BP is slightly elevated today but er home readings have been 130/80 or lower.  Well controlled on current regimen. Renal function stable, no changes today.  Lab Results  Component Value Date   CREATININE 0.86 10/06/2014   Lab Results  Component Value Date   NA 139 10/06/2014   K 3.8 10/06/2014   CL 100 10/06/2014   CO2 32 10/06/2014

## 2014-10-09 NOTE — Assessment & Plan Note (Signed)
Patient has not been see in a year for statin management of hyperlipidemia and her last assessment of liver enzymes  was a year ago.  Advised her that [facility] is q 6 month follow up with liver enzymes. Triglycerides are elevated today Recommended a low glycemic index diet,and regular exercise.  Advised to  repeat in 6 months.   Lab Results  Component Value Date   CHOL 223* 07/20/2014   HDL 52.20 07/20/2014   LDLDIRECT 109.0 07/20/2014   TRIG 343.0* 07/20/2014   CHOLHDL 4 07/20/2014   Lab Results  Component Value Date   ALT 20 07/20/2014   AST 24 07/20/2014   ALKPHOS 73 07/20/2014   BILITOT 0.4 07/20/2014       Lab Results  Component Value Date   CHOL 201* 10/06/2014   HDL 42.90 10/06/2014   LDLCALC 97 11/17/2013   LDLDIRECT 112.0 10/06/2014   TRIG 251.0* 10/06/2014   CHOLHDL 5 10/06/2014   Lab Results  Component Value Date   ALT 13 10/06/2014   AST 21 10/06/2014   ALKPHOS 74 10/06/2014   BILITOT 0.7 10/06/2014

## 2014-10-09 NOTE — Assessment & Plan Note (Signed)
Referral to Dr Kellie Moor for annual follow up.

## 2014-10-10 ENCOUNTER — Encounter: Payer: Self-pay | Admitting: *Deleted

## 2014-10-15 ENCOUNTER — Other Ambulatory Visit: Payer: Self-pay | Admitting: Internal Medicine

## 2014-11-15 DIAGNOSIS — E780 Pure hypercholesterolemia: Secondary | ICD-10-CM | POA: Diagnosis not present

## 2014-11-15 DIAGNOSIS — G4700 Insomnia, unspecified: Secondary | ICD-10-CM | POA: Diagnosis not present

## 2014-11-15 DIAGNOSIS — I10 Essential (primary) hypertension: Secondary | ICD-10-CM | POA: Diagnosis not present

## 2014-11-15 DIAGNOSIS — E785 Hyperlipidemia, unspecified: Secondary | ICD-10-CM | POA: Diagnosis not present

## 2014-11-15 DIAGNOSIS — F411 Generalized anxiety disorder: Secondary | ICD-10-CM | POA: Diagnosis not present

## 2014-11-15 LAB — CBC AND DIFFERENTIAL
HCT: 39 % (ref 36–46)
Hemoglobin: 13.5 g/dL (ref 12.0–16.0)
Neutrophils Absolute: 5 /uL
Neutrophils Absolute: 5 /uL
Platelets: 226 10*3/uL (ref 150–399)
WBC: 7.9 10*3/mL
WBC: 7.9 10^3/mL

## 2014-11-15 LAB — HEPATIC FUNCTION PANEL
ALK PHOS: 82 U/L (ref 25–125)
ALK PHOS: 82 U/L (ref 25–125)
ALT: 12 U/L (ref 7–35)
ALT: 12 U/L (ref 7–35)
AST: 22 U/L (ref 13–35)
AST: 22 U/L (ref 13–35)
Bilirubin, Total: 0.8 mg/dL

## 2014-11-15 LAB — LIPID PANEL
CHOLESTEROL: 223 mg/dL — AB (ref 0–200)
HDL: 54 mg/dL (ref 35–70)
HDL: 54 mg/dL (ref 35–70)
LDL Cholesterol: 132 mg/dL
LDL/HDL RATIO: 2.4
LDl/HDL Ratio: 2.4
TRIGLYCERIDES: 184 mg/dL — AB (ref 40–160)
Triglycerides: 184 mg/dL — AB (ref 40–160)

## 2014-11-15 LAB — BASIC METABOLIC PANEL
BUN: 14 mg/dL (ref 4–21)
CREATININE: 0.9 mg/dL (ref 0.5–1.1)
Glucose: 97 mg/dL
Potassium: 4 mmol/L (ref 3.4–5.3)
Sodium: 144 mmol/L (ref 137–147)

## 2014-11-15 LAB — TSH: TSH: 2 u[IU]/mL (ref 0.41–5.90)

## 2014-11-23 DIAGNOSIS — E875 Hyperkalemia: Secondary | ICD-10-CM | POA: Diagnosis not present

## 2014-11-29 DIAGNOSIS — F419 Anxiety disorder, unspecified: Secondary | ICD-10-CM

## 2014-11-29 DIAGNOSIS — R413 Other amnesia: Secondary | ICD-10-CM

## 2014-11-29 DIAGNOSIS — F329 Major depressive disorder, single episode, unspecified: Secondary | ICD-10-CM

## 2014-12-07 DIAGNOSIS — D225 Melanocytic nevi of trunk: Secondary | ICD-10-CM | POA: Diagnosis not present

## 2014-12-07 DIAGNOSIS — L853 Xerosis cutis: Secondary | ICD-10-CM | POA: Diagnosis not present

## 2014-12-07 DIAGNOSIS — L249 Irritant contact dermatitis, unspecified cause: Secondary | ICD-10-CM | POA: Diagnosis not present

## 2014-12-07 DIAGNOSIS — D2261 Melanocytic nevi of right upper limb, including shoulder: Secondary | ICD-10-CM | POA: Diagnosis not present

## 2014-12-17 DIAGNOSIS — R413 Other amnesia: Secondary | ICD-10-CM | POA: Insufficient documentation

## 2014-12-17 DIAGNOSIS — F419 Anxiety disorder, unspecified: Secondary | ICD-10-CM

## 2014-12-17 DIAGNOSIS — F329 Major depressive disorder, single episode, unspecified: Secondary | ICD-10-CM | POA: Insufficient documentation

## 2014-12-17 DIAGNOSIS — F32A Depression, unspecified: Secondary | ICD-10-CM | POA: Insufficient documentation

## 2015-01-03 ENCOUNTER — Encounter: Payer: Self-pay | Admitting: Family Medicine

## 2015-01-03 ENCOUNTER — Ambulatory Visit (INDEPENDENT_AMBULATORY_CARE_PROVIDER_SITE_OTHER): Payer: Medicare Other | Admitting: Family Medicine

## 2015-01-03 VITALS — BP 140/64 | HR 76 | Temp 98.3°F | Resp 16 | Ht 62.0 in | Wt 134.0 lb

## 2015-01-03 DIAGNOSIS — F419 Anxiety disorder, unspecified: Secondary | ICD-10-CM

## 2015-01-03 DIAGNOSIS — T148 Other injury of unspecified body region: Secondary | ICD-10-CM | POA: Diagnosis not present

## 2015-01-03 DIAGNOSIS — F329 Major depressive disorder, single episode, unspecified: Secondary | ICD-10-CM | POA: Diagnosis not present

## 2015-01-03 DIAGNOSIS — W57XXXA Bitten or stung by nonvenomous insect and other nonvenomous arthropods, initial encounter: Secondary | ICD-10-CM | POA: Diagnosis not present

## 2015-01-03 DIAGNOSIS — R4189 Other symptoms and signs involving cognitive functions and awareness: Secondary | ICD-10-CM | POA: Diagnosis not present

## 2015-01-03 DIAGNOSIS — F32A Depression, unspecified: Secondary | ICD-10-CM

## 2015-01-03 NOTE — Progress Notes (Signed)
Patient ID: Pam Keller, female   DOB: 06/29/1940, 74 y.o.   MRN: [id number]   Pam Keller  MRN: [id number] DOB: May 16, 1941  Subjective:  HPI   1. Cognitive changes Patient is a 74 year old female who presents for follow up on her cognition.  Her last visit was on 11/15/14.  At that time she was started on Sertraline and it was discussed that we may want to try Vayacog.  The patient had an MMSE during that visit and she was able to score 30/30.  2. Acute anxiety Patient is here with her 2 daughters.  They report that they have seen improvement in her condition.  She appears to them as being less stressed.  One daughter even noted that she has not been fearful being home alone.    4. Tick bite Patient noticed that she had a tick on her neck 2.5 weeks ago.  She now has a spot on her neck that is swollen and hard.  Patient Active Problem List   Diagnosis Date Noted  . Memory impairment 12/17/2014  . Anxiety and depression 12/17/2014  . Long-term use of high-risk medication 10/06/2014  . Hypokalemia 11/19/2013  . Chronic insomnia 10/05/2013  . Carotid bruit present 10/04/2013  . Occipital headache 09/29/2012  . Neck stiffness 09/08/2012  . 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 Screening for colon cancer 09/29/2011  . Medicare annual wellness visit, subsequent 09/27/2011  . Hyperlipidemia   . Hypertension   . Pulmonary nodule   . Basal cell carcinoma   . Screening for breast cancer 04/29/2011  . Screening for cervical cancer 04/29/2011  . History of postmenopausal HRT 04/29/2011  . Microscopic hematuria   . 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 Microscopic hematuria 2010    negative workuo  . Hyperlipidemia   . Hypertension   . Pulmonary nodule   . Basal cell carcinoma     History   Social History  . Marital Status: Widowed    Spouse Name: Timmothy Sours  . Number of Children: N/A  . Years of Education: N/A   Occupational History  . Not on  file.   Social History Main Topics  . Smoking status: Former Smoker    Types: Cigarettes    Quit date: 04/29/2007  . Smokeless tobacco: Never Used  . Alcohol Use: Yes     Comment: Occasionally  . Drug Use: No  . Sexual Activity: Not on file   Other Topics Concern  . Not on file   Social History Narrative   Retired Tour manager          Outpatient Prescriptions Prior to Visit  Medication Sig Dispense Refill  . amLODipine (NORVASC) 5 MG tablet Take 1 tablet (5 mg total) by mouth daily. 90 tablet 1  . aspirin 325 MG tablet Take 325 mg by mouth daily.      . Calcium Carbonate-Vitamin D (CALCIUM + D PO) Take by mouth.    Marland Kitchen ibuprofen (ADVIL,MOTRIN) 200 MG tablet Take 200 mg by mouth 2 (two) times daily as needed.    . Multiple Vitamin (MULTIVITAMIN) tablet Take 1 tablet by mouth daily.      . potassium chloride SA (K-DUR,KLOR-CON) 20 MEQ tablet Take 1 tablet (20 mEq total) by mouth daily. (Patient not taking: Reported on 10/06/2014) 30 tablet 3  . simvastatin (ZOCOR) 20 MG tablet TAKE 1 TABLET (20 MG TOTAL) BY MOUTH AT BEDTIME. 90 tablet 2  . triamterene-hydrochlorothiazide (MAXZIDE-25) 37.5-25  MG per tablet TAKE 1 TABLET BY MOUTH EVERY DAY 90 tablet 2   No facility-administered medications prior to visit.    Allergies  Allergen Reactions  . Clindamycin/Lincomycin   . Codeine   . Demerol   . Penicillins   . Sulfa Antibiotics     Review of Systems  Constitutional: Negative.   Respiratory: Negative.   Cardiovascular: Negative.   Gastrointestinal: Negative.   Genitourinary: Negative.   Neurological: Negative for dizziness and headaches.  Psychiatric/Behavioral: Negative.    Objective:  There were no vitals taken for this visit.  Physical Exam  Constitutional: She is oriented to person, place, and time and well-developed, well-nourished, and in no distress.  HENT:  Head: Normocephalic and atraumatic.  Right Ear: External ear normal.  Left Ear: External ear normal.   Nose: Nose normal.  Small bug bite right side of her anterior neck. It is not infected and there is no rash.  Neck: Normal range of motion. Neck supple.  Cardiovascular: Normal rate, regular rhythm and normal heart sounds.   Pulmonary/Chest: Effort normal and breath sounds normal.  Abdominal: Soft. Bowel sounds are normal.  Neurological: She is alert and oriented to person, place, and time.  Skin: Skin is warm and dry.  Psychiatric: Mood, memory, affect and judgment normal.    Assessment and Plan :   1. Depression and mild anxiety Daughters noticed she is somewhat improved on sertraline. Continue this indefinitely. 2. 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 MMSE on her next visit. 3. Tick bite/bug bite No treatment needed. 4. Hypertension Return to clinic 3-6 months. Miguel Aschoff MD [facility] 01/03/2015 2:34 PM

## 2015-05-08 ENCOUNTER — Ambulatory Visit (INDEPENDENT_AMBULATORY_CARE_PROVIDER_SITE_OTHER): Payer: Medicare Other | Admitting: Family Medicine

## 2015-05-08 ENCOUNTER — Encounter: Payer: Self-pay | Admitting: Family Medicine

## 2015-05-08 VITALS — BP 124/68 | HR 76 | Temp 97.6°F | Resp 16 | Wt 126.0 lb

## 2015-05-08 DIAGNOSIS — E785 Hyperlipidemia, unspecified: Secondary | ICD-10-CM

## 2015-05-08 DIAGNOSIS — R413 Other amnesia: Secondary | ICD-10-CM

## 2015-05-08 DIAGNOSIS — R634 Abnormal weight loss: Secondary | ICD-10-CM

## 2015-05-08 DIAGNOSIS — F418 Other specified anxiety disorders: Secondary | ICD-10-CM

## 2015-05-08 DIAGNOSIS — I10 Essential (primary) hypertension: Secondary | ICD-10-CM

## 2015-05-08 DIAGNOSIS — F419 Anxiety disorder, unspecified: Secondary | ICD-10-CM

## 2015-05-08 DIAGNOSIS — F32A Depression, unspecified: Secondary | ICD-10-CM

## 2015-05-08 DIAGNOSIS — F329 Major depressive disorder, single episode, unspecified: Secondary | ICD-10-CM

## 2015-05-08 MED ORDER — DONEPEZIL HCL 5 MG PO TABS: 5.0000 mg | ORAL_TABLET | Freq: Every day | ORAL | Status: DC

## 2015-05-08 NOTE — Progress Notes (Signed)
Patient ID: Pam Keller, female   DOB: [birth year], 74 y.o.   MRN: [id number]    Subjective:  HPI  Pt reports that she is doing well emotionally. She reports that she is taking her sertraline daily, she fixes her pill box for a week at a time, however her last rx for the sertraline was filled on 03/30/15 and she still has several left in the bottle and her pill box is full for this week. Question if she is taking it regularly. She is accompanied by her daughters today that say her memory has gotten worse and was better "when she was taking those pills", They thought that she was not taking Sertraline anymore. Daughters report that she is still doing repeatatitve things and saying things multiple times. They seem to think that this is more of a focus thing. She reports that pt mentioned that she was on "stress overload".  Pt reports that she was mowing the yard and a stick brushes across her leg about 2 weeks ago. The wound is located on her left lower leg. It is red around it and daughters said it has been getting more red. They tried Essential oils on it and seemed to get better, some pus did come out after the oils. Pt does not think it is a problem and is not very painful. She reports that it is just sore.     MMSE-28/30   Hypertension, follow-up:  BP Readings from Last 3 Encounters:  05/08/15 124/68  01/03/15 140/64  11/15/14 158/74    She was last seen for hypertension 5 months ago.  BP at that visit was 140/64. Management since that visit includes none. She reports good compliance with treatment. She is not having side effects.  She is not exercising formally, she push mows the yard and does a lot of yard work. Outside blood pressures are not being checked. Patient denies chest pain, chest pressure/discomfort, dyspnea, exertional chest pressure/discomfort, fatigue, irregular heart beat, lower extremity edema and palpitations.   Cardiovascular risk factors include advanced age  (older than 36 for men, 85 for women), dyslipidemia, hypertension and smoking/ tobacco exposure.       Weight trend:- pt has lost 8lbs since LOV.  Wt Readings from Last 3 Encounters:  05/08/15 126 lb (57.153 kg)  01/03/15 134 lb (60.782 kg)  11/15/14 136 lb (61.689 kg)     ------------------------------------------------------------------------ Pt had flu vaccine on 03/20/15 at [facility]    Prior to Admission medications   Medication Sig Start Date End Date Taking? Authorizing Provider  amLODipine (NORVASC) 5 MG tablet Take 1 tablet (5 mg total) by mouth daily. 10/05/14  Yes Crecencio Mc, MD  aspirin 325 MG tablet Take 325 mg by mouth daily.     Yes Historical Provider, MD  ibuprofen (ADVIL,MOTRIN) 200 MG tablet Take 200 mg by mouth 2 (two) times daily as needed.   Yes Historical Provider, MD  Multiple Vitamin (MULTIVITAMIN) tablet Take 1 tablet by mouth daily.     Yes Historical Provider, MD  sertraline (ZOLOFT) 50 MG tablet Take 50 mg by mouth daily. 12/08/14  Yes Historical Provider, MD  triamterene-hydrochlorothiazide (MAXZIDE-25) 37.5-25 MG per tablet TAKE 1 TABLET BY MOUTH EVERY DAY 10/17/14  Yes Crecencio Mc, MD  fluticasone Asencion Islam) 50 MCG/ACT nasal spray PRN 09/19/09   Historical Provider, MD  simvastatin (ZOCOR) 20 MG tablet TAKE 1 TABLET (20 MG TOTAL) BY MOUTH AT BEDTIME. Patient not taking: Reported on 05/08/2015 10/17/14   Aris Everts  Derrel Nip, MD    Patient Active Problem List   Diagnosis Date Noted  . Memory impairment 12/17/2014  . Anxiety and depression 12/17/2014  . Long-term use of high-risk medication 10/06/2014  . Hypokalemia 11/19/2013  . Chronic insomnia 10/05/2013  . Carotid bruit present 10/04/2013  . Occipital headache 09/29/2012  . Neck stiffness 09/08/2012  . 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 Screening for colon cancer 09/29/2011  . Medicare annual wellness visit, subsequent 09/27/2011  . Hyperlipidemia   . Hypertension   . Pulmonary nodule   . Basal cell  carcinoma   . Screening for breast cancer 04/29/2011  . Screening for cervical cancer 04/29/2011  . History of postmenopausal HRT 04/29/2011  . Microscopic hematuria   . 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 Microscopic hematuria 2010    negative workuo  . Hyperlipidemia   . Hypertension   . Pulmonary nodule   . Basal cell carcinoma     Social History   Social History  . Marital Status: Widowed    Spouse Name: Timmothy Sours  . Number of Children: N/A  . Years of Education: N/A   Occupational History  . Not on file.   Social History Main Topics  . Smoking status: Former Smoker -- 0.50 packs/day for 48 years    Types: Cigarettes    Quit date: 04/29/2007  . Smokeless tobacco: Never Used  . Alcohol Use: 0.0 oz/week    0 Standard drinks or equivalent per week     Comment: Occasionally  . Drug Use: No  . Sexual Activity: Not on file   Other Topics Concern  . Not on file   Social History Narrative   Retired Tour manager          Allergies  Allergen Reactions  . Clindamycin/Lincomycin   . Codeine   . Demerol   . Penicillins   . Sulfa Antibiotics     Review of Systems  Constitutional: Negative.   HENT: Negative.   Eyes: Negative.   Respiratory: Negative.   Cardiovascular: Negative.   Gastrointestinal: Negative.   Genitourinary: Negative.   Musculoskeletal: Negative.   Skin: Negative.        wound  Neurological: Negative.   Endo/Heme/Allergies: Negative.   Psychiatric/Behavioral: Positive for depression and memory loss. The patient is nervous/anxious.     Immunization History  Administered Date(s) Administered  . Influenza Split 03/10/2012, 03/16/2014  . Influenza-Unspecified 03/24/2013  . Pneumococcal Conjugate-13 10/04/2013  . Pneumococcal Polysaccharide-23 03/06/2012  . Tdap 03/07/2011  . Zoster 10/04/2008   Objective:  BP 124/68 mmHg  Pulse 76  Temp(Src) 97.6 F (36.4 C) (Oral)  Resp 16  Wt 126 lb (57.153  kg)  Physical Exam  Constitutional: She is oriented to person, place, and time and well-developed, well-nourished, and in no distress.  HENT:  Head: Normocephalic and atraumatic.  Right Ear: External ear normal.  Left Ear: External ear normal.  Nose: Nose normal.  Eyes: Conjunctivae and EOM are normal. Pupils are equal, round, and reactive to light.  Neck: Normal range of motion. Neck supple.  Cardiovascular: Normal rate, regular rhythm, normal heart sounds and intact distal pulses.   Pulmonary/Chest: Effort normal and breath sounds normal.  Abdominal: Soft. Bowel sounds are normal.  Musculoskeletal: Normal range of motion.  Neurological: She is alert and oriented to person, place, and time. She has normal reflexes. Gait normal. GCS score is 15.  Skin: Skin is warm and dry.  Psychiatric: Mood, memory, affect and judgment  normal.    Lab Results  Component Value Date   WBC 7.9 11/15/2014   HGB 13.5 11/15/2014   HCT 39 11/15/2014   PLT 226 11/15/2014   GLUCOSE 87 10/06/2014   CHOL 223* 11/15/2014   TRIG 184* 11/15/2014   HDL 54 11/15/2014   LDLDIRECT 112.0 10/06/2014   LDLCALC 132 11/15/2014   TSH 2.00 11/15/2014    CMP     Component Value Date/Time   NA 144 11/15/2014   NA 139 10/06/2014 0930   K 4.0 11/15/2014   CL 100 10/06/2014 0930   CO2 32 10/06/2014 0930   GLUCOSE 87 10/06/2014 0930   BUN 14 11/15/2014   BUN 16 10/06/2014 0930   CREATININE 0.9 11/15/2014   CREATININE 0.86 10/06/2014 0930   CALCIUM 9.7 10/06/2014 0930   PROT 6.8 10/06/2014 0930   ALBUMIN 4.1 10/06/2014 0930   AST 22 11/15/2014   ALT 12 11/15/2014   ALKPHOS 82 11/15/2014   BILITOT 0.7 10/06/2014 0930    Assessment and Plan :  1. Essential hypertension  - CBC with Differential/Platelet  2. Hyperlipidemia Check on lipids since stopping Simvastatin. - Lipid Panel With LDL/HDL Ratio  3. Memory impairment Worse, Follow up 1-2 month since starting Aricept today.  - donepezil (ARICEPT)  5 MG tablet; Take 1 tablet (5 mg total) by mouth at bedtime.  Dispense: 30 tablet; Refill: 12  4. Anxiety and depression   5. Weight loss Check labs and follow up next OV. - CBC with Differential/Platelet - TSH  Patient was seen and examined by Dr. Miguel Aschoff, and noted scribed by Webb Laws, [place] MD [facility] 05/08/2015 9:51 AM

## 2015-05-09 ENCOUNTER — Telehealth: Payer: Self-pay

## 2015-05-09 LAB — CBC WITH DIFFERENTIAL/PLATELET
Basophils Absolute: 0 10*3/uL (ref 0.0–0.2)
Basos: 1 %
EOS (ABSOLUTE): 0.2 10*3/uL (ref 0.0–0.4)
EOS: 3 %
HEMATOCRIT: 37.9 % (ref 34.0–46.6)
HEMOGLOBIN: 12.5 g/dL (ref 11.1–15.9)
IMMATURE GRANULOCYTES: 0 %
Immature Grans (Abs): 0 10*3/uL (ref 0.0–0.1)
LYMPHS: 22 %
Lymphocytes Absolute: 1.9 10*3/uL (ref 0.7–3.1)
MCH: 28.5 pg (ref 26.6–33.0)
MCHC: 33 g/dL (ref 31.5–35.7)
MCV: 87 fL (ref 79–97)
Monocytes Absolute: 0.8 10*3/uL (ref 0.1–0.9)
Monocytes: 9 %
NEUTROS ABS: 5.7 10*3/uL (ref 1.4–7.0)
NEUTROS PCT: 65 %
Platelets: 268 10*3/uL (ref 150–379)
RBC: 4.38 x10E6/uL (ref 3.77–5.28)
RDW: 14.7 % (ref 12.3–15.4)
WBC: 8.6 10*3/uL (ref 3.4–10.8)

## 2015-05-09 LAB — LIPID PANEL WITH LDL/HDL RATIO
CHOLESTEROL TOTAL: 277 mg/dL — AB (ref 100–199)
HDL: 43 mg/dL (ref >39)
LDL Calculated: 200 mg/dL — ABNORMAL HIGH (ref 0–99)
LDl/HDL Ratio: 4.7 ratio units — ABNORMAL HIGH (ref 0.0–3.2)
Triglycerides: 172 mg/dL — ABNORMAL HIGH (ref 0–149)
VLDL Cholesterol Cal: 34 mg/dL (ref 5–40)

## 2015-05-09 LAB — TSH: TSH: 2.17 u[IU]/mL (ref 0.450–4.500)

## 2015-05-09 NOTE — Telephone Encounter (Signed)
-----   Message from Jerrol Banana., MD sent at 05/09/2015 10:53 AM EST ----- Labs okay but cholesterol really high. Recommend trying Pravachol 10 mg daily with over-the-counter Co-Q-10 daily.RTC 3-4 weeks.

## 2015-05-09 NOTE — Telephone Encounter (Signed)
Left message to call back  

## 2015-05-10 ENCOUNTER — Other Ambulatory Visit: Payer: Self-pay

## 2015-05-10 MED ORDER — PRAVASTATIN SODIUM 20 MG PO TABS: 20.0000 mg | ORAL_TABLET | Freq: Every day | ORAL | Status: DC

## 2015-05-10 NOTE — Progress Notes (Signed)
Patient advised and Rx sent to pharmacy ED 

## 2015-06-05 ENCOUNTER — Ambulatory Visit (INDEPENDENT_AMBULATORY_CARE_PROVIDER_SITE_OTHER): Payer: Medicare Other | Admitting: Family Medicine

## 2015-06-05 ENCOUNTER — Encounter: Payer: Self-pay | Admitting: Family Medicine

## 2015-06-05 VITALS — BP 146/74 | HR 72 | Temp 97.6°F | Resp 16 | Wt 131.0 lb

## 2015-06-05 DIAGNOSIS — R413 Other amnesia: Secondary | ICD-10-CM

## 2015-06-05 DIAGNOSIS — E785 Hyperlipidemia, unspecified: Secondary | ICD-10-CM | POA: Diagnosis not present

## 2015-06-05 NOTE — Progress Notes (Signed)
Patient ID: Pam Keller, female   DOB: Dec 16, 1940, 74 y.o.   MRN: [id number]    Subjective:  HPI Pt is here today to follow up on her memory and cholesterol. She is alone today, her daughters did not accompany her. She reports that she is taking the Aricept that she was started on about a month ago. She has not known side effects. She feels that her memory is normal for her age. She drove herself here today. Labs were ordered LOV and she had them done and needed to start [place]. She does know for sure if she is actually taking this or not but says the name sounds familiar. At the last OV her daughters were concerned about her weight loss. Pt has gained 6 pounds since 05/09/15.   Prior to Admission medications   Medication Sig Start Date End Date Taking? Authorizing Provider  amLODipine (NORVASC) 5 MG tablet Take 1 tablet (5 mg total) by mouth daily. 10/05/14  Yes Crecencio Mc, MD  aspirin 325 MG tablet Take 325 mg by mouth daily.     Yes Historical Provider, MD  donepezil (ARICEPT) 5 MG tablet Take 1 tablet (5 mg total) by mouth at bedtime. 05/08/15  Yes [place] Rogacki Maceo Pro., MD  fluticasone Asencion Islam) 50 MCG/ACT nasal spray PRN 09/19/09  Yes Historical Provider, MD  ibuprofen (ADVIL,MOTRIN) 200 MG tablet Take 200 mg by mouth 2 (two) times daily as needed.   Yes Historical Provider, MD  Multiple Vitamin (MULTIVITAMIN) tablet Take 1 tablet by mouth daily.     Yes Historical Provider, MD  sertraline (ZOLOFT) 50 MG tablet Take 50 mg by mouth daily. 12/08/14  Yes Historical Provider, MD  simvastatin (ZOCOR) 20 MG tablet TAKE 1 TABLET (20 MG TOTAL) BY MOUTH AT BEDTIME. 10/17/14  Yes Crecencio Mc, MD  triamterene-hydrochlorothiazide (MAXZIDE-25) 37.5-25 MG per tablet TAKE 1 TABLET BY MOUTH EVERY DAY 10/17/14  Yes Crecencio Mc, MD  pravastatin (PRAVACHOL) 20 MG tablet Take 1 tablet (20 mg total) by mouth daily. Patient not taking: Reported on 06/05/2015 05/10/15   Jerrol Banana., MD    Patient Active Problem List   Diagnosis Date Noted  . Memory impairment 12/17/2014  . Anxiety and depression 12/17/2014  . Long-term use of high-risk medication 10/06/2014  . Hypokalemia 11/19/2013  . Chronic insomnia 10/05/2013  . Carotid bruit present 10/04/2013  . Occipital headache 09/29/2012  . Neck stiffness 09/08/2012  . 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 Screening for colon cancer 09/29/2011  . Medicare annual wellness visit, subsequent 09/27/2011  . Hyperlipidemia   . Hypertension   . Pulmonary nodule   . Basal cell carcinoma   . Screening for breast cancer 04/29/2011  . Screening for cervical cancer 04/29/2011  . History of postmenopausal HRT 04/29/2011  . Microscopic hematuria   . 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 Microscopic hematuria 2010    negative workuo  . Hyperlipidemia   . Hypertension   . Pulmonary nodule   . Basal cell carcinoma     Social History   Social History  . Marital Status: Widowed    Spouse Name: Timmothy Sours  . Number of Children: N/A  . Years of Education: N/A   Occupational History  . Not on file.   Social History Main Topics  . Smoking status: Former Smoker -- 0.50 packs/day for 48 years    Types: Cigarettes    Quit date: 04/29/2007  .  Smokeless tobacco: Never Used  . Alcohol Use: 0.0 oz/week    0 Standard drinks or equivalent per week     Comment: Occasionally  . Drug Use: No  . Sexual Activity: Not on file   Other Topics Concern  . Not on file   Social History Narrative   Retired Tour manager          Allergies  Allergen Reactions  . Meperidine Other (See Comments)    Other Reaction: GI Upset  . Clindamycin/Lincomycin   . Codeine   . Demerol   . Penicillins   . Sulfa Antibiotics     Review of Systems  Constitutional: Negative.   HENT: Negative.   Eyes: Negative.   Respiratory: Negative.   Cardiovascular: Negative.   Gastrointestinal: Negative.   Genitourinary:  Negative.   Musculoskeletal: Negative.   Skin: Negative.   Neurological: Negative.   Endo/Heme/Allergies: Negative.   Psychiatric/Behavioral: Negative.     Immunization History  Administered Date(s) Administered  . Influenza Split 03/10/2012, 03/16/2014  . Influenza-Unspecified 03/24/2013  . Pneumococcal Conjugate-13 10/04/2013  . Pneumococcal Polysaccharide-23 03/06/2012  . Tdap 03/07/2011  . Zoster 10/04/2008   Objective:  BP 146/74 mmHg  Pulse 72  Temp(Src) 97.6 F (36.4 C) (Oral)  Resp 16  Wt 131 lb (59.421 kg)  Physical Exam  Constitutional: She is oriented to person, place, and time and well-developed, well-nourished, and in no distress.  Eyes: Conjunctivae and EOM are normal. Pupils are equal, round, and reactive to light.  Neck: Normal range of motion. Neck supple.  Cardiovascular: Normal rate, regular rhythm, normal heart sounds and intact distal pulses.   Pulmonary/Chest: Effort normal and breath sounds normal.  Abdominal: Soft. Bowel sounds are normal.  Musculoskeletal: Normal range of motion.  Neurological: She is alert and oriented to person, place, and time. She has normal reflexes. Gait normal. GCS score is 15.  Skin: Skin is warm and dry.  Psychiatric: Mood and affect normal.    Lab Results  Component Value Date   WBC 8.6 05/08/2015   HGB 13.5 11/15/2014   HCT 37.9 05/08/2015   PLT 226 11/15/2014   GLUCOSE 87 10/06/2014   CHOL 277* 05/08/2015   TRIG 172* 05/08/2015   HDL 43 05/08/2015   LDLDIRECT 112.0 10/06/2014   LDLCALC 200* 05/08/2015   TSH 2.170 05/08/2015    CMP     Component Value Date/Time   NA 144 11/15/2014   NA 139 10/06/2014 0930   K 4.0 11/15/2014   CL 100 10/06/2014 0930   CO2 32 10/06/2014 0930   GLUCOSE 87 10/06/2014 0930   BUN 14 11/15/2014   BUN 16 10/06/2014 0930   CREATININE 0.9 11/15/2014   CREATININE 0.86 10/06/2014 0930   CALCIUM 9.7 10/06/2014 0930   PROT 6.8 10/06/2014 0930   ALBUMIN 4.1 10/06/2014 0930    AST 22 11/15/2014   ALT 12 11/15/2014   ALKPHOS 82 11/15/2014   BILITOT 0.7 10/06/2014 0930    Assessment and Plan :  1. Memory impairment/early AD Doing ok on Aricept. Will continue to follow.   2. Hyperlipidemia  - Lipid Panel With LDL/HDL Ratio - Hepatic function panel 3.HTN 4chronic Anxiety/Depression Continue SSRI indefinitely Patient was seen and examined by Dr. Miguel Aschoff, and noted scribed by Webb Laws, [place] MD [facility] 06/05/2015 11:28 AM

## 2015-06-06 LAB — LIPID PANEL WITH LDL/HDL RATIO
CHOLESTEROL TOTAL: 226 mg/dL — AB (ref 100–199)
HDL: 43 mg/dL (ref >39)
LDL CALC: 150 mg/dL — AB (ref 0–99)
LDl/HDL Ratio: 3.5 ratio units — ABNORMAL HIGH (ref 0.0–3.2)
TRIGLYCERIDES: 164 mg/dL — AB (ref 0–149)
VLDL Cholesterol Cal: 33 mg/dL (ref 5–40)

## 2015-06-06 LAB — HEPATIC FUNCTION PANEL
ALK PHOS: 80 IU/L (ref 39–117)
ALT: 10 IU/L (ref 0–32)
AST: 14 IU/L (ref 0–40)
Albumin: 4 g/dL (ref 3.5–4.8)
BILIRUBIN TOTAL: 0.6 mg/dL (ref 0.0–1.2)
Bilirubin, Direct: 0.11 mg/dL (ref 0.00–0.40)
Total Protein: 6.3 g/dL (ref 6.0–8.5)

## 2015-07-17 ENCOUNTER — Encounter: Payer: Self-pay | Admitting: Family Medicine

## 2015-07-17 ENCOUNTER — Ambulatory Visit (INDEPENDENT_AMBULATORY_CARE_PROVIDER_SITE_OTHER): Payer: Medicare Other | Admitting: Family Medicine

## 2015-07-17 VITALS — BP 132/80 | HR 82 | Temp 98.6°F | Resp 16 | Wt 131.0 lb

## 2015-07-17 DIAGNOSIS — F329 Major depressive disorder, single episode, unspecified: Secondary | ICD-10-CM

## 2015-07-17 DIAGNOSIS — R413 Other amnesia: Secondary | ICD-10-CM | POA: Diagnosis not present

## 2015-07-17 DIAGNOSIS — F32A Depression, unspecified: Secondary | ICD-10-CM

## 2015-07-17 DIAGNOSIS — F419 Anxiety disorder, unspecified: Principal | ICD-10-CM

## 2015-07-17 DIAGNOSIS — F418 Other specified anxiety disorders: Secondary | ICD-10-CM

## 2015-07-17 DIAGNOSIS — E785 Hyperlipidemia, unspecified: Secondary | ICD-10-CM

## 2015-07-17 NOTE — Progress Notes (Signed)
Patient ID: Pam Keller, female   DOB: 1941-05-04, 75 y.o.   MRN: [id number]    Subjective:  HPI Pt is here for a 2 month follow up for MCI, Anxiety and depression. She reports that she is doing well and feeling well emotionally. She has not noticed any worsening of either of these problems.  Prior to Admission medications   Medication Sig Start Date End Date Taking? Authorizing Provider  amLODipine (NORVASC) 5 MG tablet Take 1 tablet (5 mg total) by mouth daily. 10/05/14  Yes Crecencio Mc, MD  aspirin 325 MG tablet Take 325 mg by mouth daily.     Yes Historical Provider, MD  donepezil (ARICEPT) 5 MG tablet Take 1 tablet (5 mg total) by mouth at bedtime. 05/08/15  Yes Takeya Marquis Maceo Pro., MD  fluticasone Asencion Islam) 50 MCG/ACT nasal spray PRN 09/19/09  Yes Historical Provider, MD  ibuprofen (ADVIL,MOTRIN) 200 MG tablet Take 200 mg by mouth 2 (two) times daily as needed.   Yes Historical Provider, MD  Multiple Vitamin (MULTIVITAMIN) tablet Take 1 tablet by mouth daily.     Yes Historical Provider, MD  pravastatin (PRAVACHOL) 20 MG tablet Take 1 tablet (20 mg total) by mouth daily. 05/10/15  Yes Daimen Shovlin Maceo Pro., MD  sertraline (ZOLOFT) 50 MG tablet Take 50 mg by mouth daily. 12/08/14  Yes Historical Provider, MD  simvastatin (ZOCOR) 20 MG tablet TAKE 1 TABLET (20 MG TOTAL) BY MOUTH AT BEDTIME. 10/17/14  Yes Crecencio Mc, MD  triamterene-hydrochlorothiazide (MAXZIDE-25) 37.5-25 MG per tablet TAKE 1 TABLET BY MOUTH EVERY DAY 10/17/14  Yes Crecencio Mc, MD    Patient Active Problem List   Diagnosis Date Noted  . Memory impairment 12/17/2014  . Anxiety and depression 12/17/2014  . Long-term use of high-risk medication 10/06/2014  . Hypokalemia 11/19/2013  . Chronic insomnia 10/05/2013  . Carotid bruit present 10/04/2013  . Occipital headache 09/29/2012  . Neck stiffness 09/08/2012  . 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 Screening for colon cancer 09/29/2011  . Medicare annual wellness  visit, subsequent 09/27/2011  . Hyperlipidemia   . Hypertension   . Pulmonary nodule   . Basal cell carcinoma   . Screening for breast cancer 04/29/2011  . Screening for cervical cancer 04/29/2011  . History of postmenopausal HRT 04/29/2011  . Microscopic hematuria   . 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 Microscopic hematuria 2010    negative workuo  . Hyperlipidemia   . Hypertension   . Pulmonary nodule   . Basal cell carcinoma     Social History   Social History  . Marital Status: Widowed    Spouse Name: Timmothy Sours  . Number of Children: N/A  . Years of Education: N/A   Occupational History  . Not on file.   Social History Main Topics  . Smoking status: Former Smoker -- 0.50 packs/day for 48 years    Types: Cigarettes    Quit date: 04/29/2007  . Smokeless tobacco: Never Used  . Alcohol Use: 0.0 oz/week    0 Standard drinks or equivalent per week     Comment: Occasionally  . Drug Use: No  . Sexual Activity: Not on file   Other Topics Concern  . Not on file   Social History Narrative   Retired Tour manager          Allergies  Allergen Reactions  . Meperidine Other (See Comments)    Other Reaction: GI Upset  .  Clindamycin/Lincomycin   . Codeine   . Demerol   . Penicillins   . Sulfa Antibiotics     Review of Systems  Constitutional: Negative.   HENT: Negative.   Eyes: Negative.   Respiratory: Negative.   Cardiovascular: Negative.   Gastrointestinal: Negative.   Genitourinary: Negative.   Musculoskeletal: Negative.   Skin: Negative.   Neurological: Negative.   Endo/Heme/Allergies: Negative.   Psychiatric/Behavioral: Negative.     Immunization History  Administered Date(s) Administered  . Influenza Split 03/10/2012, 03/16/2014  . Influenza-Unspecified 03/24/2013  . Pneumococcal Conjugate-13 10/04/2013  . Pneumococcal Polysaccharide-23 03/06/2012  . Tdap 03/07/2011  . Zoster 10/04/2008   Objective:  BP 132/80  mmHg  Pulse 82  Temp(Src) 98.6 F (37 C) (Oral)  Resp 16  Wt 131 lb (59.421 kg)  Physical Exam  Constitutional: She is oriented to person, place, and time and well-developed, well-nourished, and in no distress.  Eyes: Conjunctivae and EOM are normal. Pupils are equal, round, and reactive to light.  Neck: Normal range of motion. Neck supple.  Cardiovascular: Normal rate, regular rhythm, normal heart sounds and intact distal pulses.   Pulmonary/Chest: Effort normal and breath sounds normal.  Abdominal: Soft. Bowel sounds are normal.  Musculoskeletal: Normal range of motion.  Neurological: She is alert and oriented to person, place, and time. She has normal reflexes. Gait normal. GCS score is 15.  Skin: Skin is warm and dry.  Psychiatric: Mood, memory, affect and judgment normal.    Lab Results  Component Value Date   WBC 8.6 05/08/2015   HGB 13.5 11/15/2014   HCT 37.9 05/08/2015   PLT 268 05/08/2015   GLUCOSE 87 10/06/2014   CHOL 226* 06/05/2015   TRIG 164* 06/05/2015   HDL 43 06/05/2015   LDLDIRECT 112.0 10/06/2014   LDLCALC 150* 06/05/2015   TSH 2.170 05/08/2015    CMP     Component Value Date/Time   NA 144 11/15/2014   NA 139 10/06/2014 0930   K 4.0 11/15/2014   CL 100 10/06/2014 0930   CO2 32 10/06/2014 0930   GLUCOSE 87 10/06/2014 0930   BUN 14 11/15/2014   BUN 16 10/06/2014 0930   CREATININE 0.9 11/15/2014   CREATININE 0.86 10/06/2014 0930   CALCIUM 9.7 10/06/2014 0930   PROT 6.3 06/05/2015 1152   PROT 6.8 10/06/2014 0930   ALBUMIN 4.0 06/05/2015 1152   ALBUMIN 4.1 10/06/2014 0930   AST 14 06/05/2015 1152   ALT 10 06/05/2015 1152   ALKPHOS 80 06/05/2015 1152   BILITOT 0.6 06/05/2015 1152   BILITOT 0.7 10/06/2014 0930    Assessment and Plan :  1. Anxiety and depression Stable.continue present medication 2. Memory impairment Stable. MMSE next OV.Last MMSE was 28/30. Continue present medication 3. Hyperlipidemia Stable. 4. Hypertension  Patient  was seen and examined by Dr. Miguel Aschoff, and noted scribed by Webb Laws, [place] MD [facility] 07/17/2015 9:13 AM

## 2015-08-14 ENCOUNTER — Other Ambulatory Visit: Payer: Self-pay | Admitting: Internal Medicine

## 2015-09-18 ENCOUNTER — Other Ambulatory Visit: Payer: Self-pay | Admitting: Internal Medicine

## 2015-09-19 NOTE — Telephone Encounter (Signed)
Please advise refill as you are listed as her PCP, Thanks Lavella Lemons, RN

## 2015-10-10 ENCOUNTER — Encounter: Payer: Self-pay | Admitting: Family Medicine

## 2015-10-10 ENCOUNTER — Ambulatory Visit (INDEPENDENT_AMBULATORY_CARE_PROVIDER_SITE_OTHER): Payer: Medicare Other | Admitting: Family Medicine

## 2015-10-10 VITALS — BP 138/62 | HR 72 | Temp 98.1°F | Resp 16 | Ht 63.75 in | Wt 130.0 lb

## 2015-10-10 DIAGNOSIS — D126 Benign neoplasm of colon, unspecified: Secondary | ICD-10-CM

## 2015-10-10 DIAGNOSIS — Z1231 Encounter for screening mammogram for malignant neoplasm of breast: Secondary | ICD-10-CM

## 2015-10-10 DIAGNOSIS — Z0000 Encounter for general adult medical examination without abnormal findings: Secondary | ICD-10-CM | POA: Diagnosis not present

## 2015-10-10 DIAGNOSIS — Z1211 Encounter for screening for malignant neoplasm of colon: Secondary | ICD-10-CM

## 2015-10-10 NOTE — Progress Notes (Signed)
Patient ID: Charlestine Night, female   DOB: 01/03/1941, 75 y.o.   MRN: [id number]  Visit Date: 10/10/2015  Today's Provider: Wilhemena Durie, MD   Chief Complaint  Patient presents with  . Medicare Wellness   Subjective:   Pam Keller is a 75 y.o. female who presents today for her Subsequent Annual Wellness Visit. She feels well. She reports exercising does a lot of yard work daily. She reports she is sleeping fairly well. Immunization History  Administered Date(s) Administered  . Influenza Split 03/10/2012, 03/16/2014  . Influenza-Unspecified 03/24/2013, 02/23/2015  . Pneumococcal Conjugate-13 10/04/2013  . Pneumococcal Polysaccharide-23 03/06/2012  . Tdap 03/07/2011  . Zoster 10/04/2008   Last  Colonoscopy 09/18/04 benign polyp, repeat in 5 years per report-patient has not had one since 2006.  BMD 10/25/13-normal  Mammogram 10/25/13-normal  Pap smear 09/19/09 with gyn and it was normal-none since then, she has had hysterectomy-had some abnormal bleeding and pains, no tumors or anything malignant per patient, no abnormal pap smears in the past.  Review of Systems  Constitutional: Negative.   HENT: Negative.   Eyes: Negative.   Respiratory: Negative.   Cardiovascular: Negative.   Gastrointestinal: Negative.   Endocrine: Negative.   Genitourinary: Negative.   Musculoskeletal: Positive for myalgias.  Skin: Negative.   Allergic/Immunologic: Negative.   Neurological: Negative.   Hematological: Negative.   Psychiatric/Behavioral: Negative.     Patient Active Problem List   Diagnosis Date Noted  . Memory impairment 12/17/2014  . Anxiety and depression 12/17/2014  . Long-term use of high-risk medication 10/06/2014  . Hypokalemia 11/19/2013  . Chronic insomnia 10/05/2013  . Carotid bruit present 10/04/2013  . Occipital headache 09/29/2012  . Neck stiffness 09/08/2012  . 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 Screening for colon cancer 09/29/2011  . Medicare annual wellness  visit, subsequent 09/27/2011  . Hyperlipidemia   . Hypertension   . Pulmonary nodule   . Basal cell carcinoma   . Screening for breast cancer 04/29/2011  . Screening for cervical cancer 04/29/2011  . History of postmenopausal HRT 04/29/2011  . Microscopic hematuria   . 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 Marital Status: Widowed    Spouse Name: Timmothy Sours  . Number of Children: N/A  . Years of Education: N/A   Occupational History  . Not on file.   Social History Main Topics  . Smoking status: Former Smoker -- 0.50 packs/day for 48 years    Types: Cigarettes    Quit date: 04/29/2007  . Smokeless tobacco: Never Used  . Alcohol Use: 0.0 oz/week    0 Standard drinks or equivalent per week     Comment: once or a twice a year.  . Drug Use: No  . Sexual Activity: No   Other Topics Concern  . Not on file   Social History Narrative   Retired Tour manager          Past Surgical History  Procedure Laterality Date  . Macular surgery      [facility], repaired  . Mohs surgery  2012    for basal cell, forehead  . Appendectomy  1955    for rupture appx  . Oophorectomy  1960  . Abdominal hysterectomy  1984    menorrhagia  . Breast biopsy      benign  . Trigger finger release  2004    [facility]  . Carpal tunnel release    . Foot surgery      Her  family history includes Hypertension in her father. There is no history of Cancer.    Outpatient Prescriptions Prior to Visit  Medication Sig Dispense Refill  . amLODipine (NORVASC) 5 MG tablet TAKE 1 TABLET (5 MG TOTAL) BY MOUTH DAILY. 90 tablet 0  . aspirin 325 MG tablet Take 325 mg by mouth daily.      Marland Kitchen donepezil (ARICEPT) 5 MG tablet Take 1 tablet (5 mg total) by mouth at bedtime. 30 tablet 12  . fluticasone (FLONASE) 50 MCG/ACT nasal spray PRN    . ibuprofen (ADVIL,MOTRIN) 200 MG tablet Take 200 mg by mouth 2 (two) times daily as needed.    . Multiple Vitamin (MULTIVITAMIN) tablet Take 1 tablet by mouth  daily.      . sertraline (ZOLOFT) 50 MG tablet Take 50 mg by mouth daily.  12  . simvastatin (ZOCOR) 20 MG tablet TAKE 1 TABLET (20 MG TOTAL) BY MOUTH AT BEDTIME. 90 tablet 2  . triamterene-hydrochlorothiazide (MAXZIDE-25) 37.5-25 MG per tablet TAKE 1 TABLET BY MOUTH EVERY DAY 90 tablet 2  . pravastatin (PRAVACHOL) 20 MG tablet Take 1 tablet (20 mg total) by mouth daily. 90 tablet 3  . triamterene-hydrochlorothiazide (MAXZIDE-25) 37.5-25 MG tablet TAKE 1 TABLET BY MOUTH EVERY DAY 90 tablet 3   No facility-administered medications prior to visit.    Allergies  Allergen Reactions  . Meperidine Other (See Comments)    Other Reaction: GI Upset  . Clindamycin/Lincomycin   . Codeine   . Demerol   . Penicillins   . Sulfa Antibiotics     Patient Care Team: Jerrol Banana., MD as PCP - General (Family Medicine)  Objective:   Vitals:  Filed Vitals:   10/10/15 1046  BP: 138/62  Pulse: 72  Temp: 98.1 F (36.7 C)  Resp: 16  Height: 5' 3.75" (1.619 m)  Weight: 130 lb (58.968 kg)    Physical Exam  Constitutional: She is oriented to person, place, and time. She appears well-developed and well-nourished.  HENT:  Head: Normocephalic and atraumatic.  Right Ear: External ear normal.  Left Ear: External ear normal.  Eyes: Conjunctivae are normal. Pupils are equal, round, and reactive to light.  Neck: Normal range of motion. Neck supple.  Cardiovascular: Normal rate, regular rhythm, normal heart sounds and intact distal pulses.   No murmur heard. Pulmonary/Chest: Effort normal and breath sounds normal. No respiratory distress. She has no wheezes.  Abdominal: Soft. There is no tenderness. There is no rebound and no guarding.  Genitourinary:  Defer exam today.  Musculoskeletal: She exhibits no edema or tenderness.  Neurological: She is alert and oriented to person, place, and time.  Skin: No rash noted.  Spider varicose veins in the lower legs.  Psychiatric: She has a normal  mood and affect. Her behavior is normal. Judgment and thought content normal.    Activities of Daily Living In your present state of health, do you have any difficulty performing the following activities: 10/10/2015 06/05/2015  Hearing? N N  Vision? N N  Difficulty concentrating or making decisions? Y N  Walking or climbing stairs? N N  Dressing or bathing? N N  Doing errands, shopping? N N    Fall Risk Assessment Fall Risk  10/10/2015 10/09/2014 10/06/2014 10/04/2013  Falls in the past year? Yes No No Yes  Number falls in past yr: 1 - - 1  Injury with Fall? No - - No     Depression Screen [facility] 2/9 Scores 10/10/2015 10/09/2014 10/06/2014 10/04/2013  PHQ - 2 Score 0 0 0 3  PHQ- 9 Score - - - 3    Cognitive Testing - 6-CIT    Year: 0 4 points  Month: 0 3 points  Memorize "Pia Mau, [address], [place]"  Time (within 1 hour:) 0 3 points  Count backwards from 20: 0 2 4 points  Name months of year: 0 2 4 points  Repeat Address: 0 2 4 6 8 10  points   Total Score: 9/28  Interpretation : Normal (0-7) Abnormal (8-28)    Assessment & Plan:     Annual Wellness Visit  Reviewed patient's Family Medical History Reviewed and updated list of patient's medical providers Assessment of cognitive impairment was done Assessed patient's functional ability Established a written schedule for health screening [place] Completed and Reviewed  Memory is a concern for the patient, this is getting worse and patient states she knows. I am concerned with her driving.  2. Colon cancer screening Due for colonoscopy, refer to Dr .Allen Norris. No DRE today. - Ambulatory referral to Gastroenterology  3. Tubular adenoma of colon - Ambulatory referral to Gastroenterology  4. Encounter for screening mammogram for breast cancer Patient advised to call [facility] and make this appointment. - MM Digital Screening; Future I have done the exam and reviewed the above chart and it  is accurate to the best of my knowledge. 5. Mild cognitive impairment Progressive.  Patient was seen and examined by Dr. Eulas Post and note was scribed by Theressa Millard, RMA.    Miguel Aschoff MD [facility] 10/10/2015 10:51 AM  ------------------------------------------------------------------------------------------------------------

## 2015-11-09 ENCOUNTER — Other Ambulatory Visit: Payer: Self-pay | Admitting: Internal Medicine

## 2015-12-05 ENCOUNTER — Telehealth: Payer: Self-pay | Admitting: Family Medicine

## 2015-12-05 NOTE — Telephone Encounter (Signed)
Pt's daughter would like to speak to Dr. Rosanna Randy about her mother.  She would like to have a conversation with Dr.Gilbert and family.  They have concerns about her driving and other memory problems.

## 2015-12-05 NOTE — Telephone Encounter (Signed)
Make appt to see you for consultation?-aa

## 2016-01-23 ENCOUNTER — Ambulatory Visit (INDEPENDENT_AMBULATORY_CARE_PROVIDER_SITE_OTHER): Payer: Medicare Other | Admitting: Family Medicine

## 2016-01-23 ENCOUNTER — Encounter: Payer: Self-pay | Admitting: Family Medicine

## 2016-01-23 VITALS — BP 170/88 | HR 74 | Temp 98.1°F | Resp 14 | Wt 134.0 lb

## 2016-01-23 DIAGNOSIS — F329 Major depressive disorder, single episode, unspecified: Secondary | ICD-10-CM

## 2016-01-23 DIAGNOSIS — R413 Other amnesia: Secondary | ICD-10-CM | POA: Diagnosis not present

## 2016-01-23 DIAGNOSIS — F32A Depression, unspecified: Secondary | ICD-10-CM

## 2016-01-23 DIAGNOSIS — F419 Anxiety disorder, unspecified: Secondary | ICD-10-CM

## 2016-01-23 DIAGNOSIS — I10 Essential (primary) hypertension: Secondary | ICD-10-CM | POA: Diagnosis not present

## 2016-01-23 DIAGNOSIS — E785 Hyperlipidemia, unspecified: Secondary | ICD-10-CM | POA: Diagnosis not present

## 2016-01-23 DIAGNOSIS — F418 Other specified anxiety disorders: Secondary | ICD-10-CM

## 2016-01-23 MED ORDER — LOSARTAN POTASSIUM-HCTZ 100-25 MG PO TABS: 1.0000 | ORAL_TABLET | Freq: Every day | ORAL | 3 refills | Status: DC

## 2016-01-23 NOTE — Progress Notes (Signed)
Subjective:  HPI  Patient is here for follow up. Last visit was in April for wellness visit. 6CIT 9 score was 9/28-moderately abnormal. MMSE today is 24/30. Patient's daughter states she has noticed memory is worse-examples are having trouble with days , driving around and patient stating that surroundings are not familiar but she should know where she is. She is taking Aricept 5 mg daily. She has some days she is not eating all day almost because she is not hungry. Her daughters are both present and state that she is insignificant and continuous decline over the past year. Wt Readings from Last 3 Encounters:  01/23/16 134 lb (60.8 kg)  10/10/15 130 lb (59 kg)  07/17/15 131 lb (59.4 kg)   BP Readings from Last 3 Encounters:  01/23/16 (!) 178/82  10/10/15 138/62  07/17/15 132/80    Prior to Admission medications   Medication Sig Start Date End Date Taking? Authorizing Provider  amLODipine (NORVASC) 5 MG tablet TAKE 1 TABLET (5 MG TOTAL) BY MOUTH DAILY. 08/14/15  Yes Crecencio Mc, MD  aspirin 325 MG tablet Take 325 mg by mouth daily.     Yes Historical Provider, MD  donepezil (ARICEPT) 5 MG tablet Take 1 tablet (5 mg total) by mouth at bedtime. 05/08/15  Yes Richard Maceo Pro., MD  fluticasone Asencion Islam) 50 MCG/ACT nasal spray PRN 09/19/09  Yes Historical Provider, MD  pravastatin (PRAVACHOL) 20 MG tablet Take 20 mg by mouth daily.   Yes Historical Provider, MD  sertraline (ZOLOFT) 50 MG tablet Take 50 mg by mouth daily. 12/08/14  Yes Historical Provider, MD  triamterene-hydrochlorothiazide (MAXZIDE-25) 37.5-25 MG per tablet TAKE 1 TABLET BY MOUTH EVERY DAY 10/17/14  Yes Crecencio Mc, MD    Patient Active Problem List   Diagnosis Date Noted  . Memory impairment 12/17/2014  . Anxiety and depression 12/17/2014  . Long-term use of high-risk medication 10/06/2014  . Hypokalemia 11/19/2013  . Chronic insomnia 10/05/2013  . Carotid bruit present 10/04/2013  . Occipital headache  09/29/2012  . Neck stiffness 09/08/2012  . 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 Screening for colon cancer 09/29/2011  . Medicare annual wellness visit, subsequent 09/27/2011  . Hyperlipidemia   . Hypertension   . Pulmonary nodule   . Basal cell carcinoma   . Screening for breast cancer 04/29/2011  . Screening for cervical cancer 04/29/2011  . History of postmenopausal HRT 04/29/2011  . Microscopic hematuria   . 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 Basal cell carcinoma   . Hyperlipidemia   . Hypertension   . Microscopic hematuria 2010   negative workuo  . Pulmonary nodule     Social History   Social History  . Marital status: Widowed    Spouse name: Timmothy Sours  . Number of children: N/A  . Years of education: N/A   Occupational History  . Not on file.   Social History Main Topics  . Smoking status: Former Smoker    Packs/day: 0.50    Years: 48.00    Types: Cigarettes    Quit date: 04/29/2007  . Smokeless tobacco: Never Used  . Alcohol use 0.0 oz/week     Comment: once or a twice a year.  . Drug use: No  . Sexual activity: No   Other Topics Concern  . Not on file   Social History Narrative   Retired Tour manager          Allergies  Allergen  Reactions  . Meperidine Other (See Comments)    Other Reaction: GI Upset  . Clindamycin/Lincomycin   . Codeine   . Demerol   . Penicillins   . Sulfa Antibiotics     Review of Systems  Constitutional: Negative.        Decreased appetite  Respiratory: Negative.   Cardiovascular: Negative.   Psychiatric/Behavioral: Positive for depression (some) and memory loss.    Immunization History  Administered Date(s) Administered  . Influenza Split 03/10/2012, 03/16/2014  . Influenza-Unspecified 03/24/2013, 02/23/2015  . Pneumococcal Conjugate-13 10/04/2013  . Pneumococcal Polysaccharide-23 03/06/2012  . Tdap 03/07/2011  . Zoster 10/04/2008   Objective:  BP (!) 178/82   Pulse 74   Temp 98.1  F (36.7 C)   Resp 14   Wt 134 lb (60.8 kg)   BMI 23.18 kg/m   Physical Exam  Constitutional: She is oriented to person, place, and time and well-developed, well-nourished, and in no distress.  HENT:  Head: Normocephalic and atraumatic.  Eyes: Conjunctivae are normal. Pupils are equal, round, and reactive to light.  Neck: Normal range of motion. Neck supple.  Cardiovascular: Normal rate, regular rhythm, normal heart sounds and intact distal pulses.   No murmur heard. Pulmonary/Chest: Effort normal and breath sounds normal. No respiratory distress. She has no wheezes.  Musculoskeletal: She exhibits no edema or tenderness.  Neurological: She is oriented to person, place, and time.  Psychiatric: She exhibits abnormal remote memory.    Lab Results  Component Value Date   WBC 8.6 05/08/2015   HGB 13.5 11/15/2014   HCT 37.9 05/08/2015   PLT 268 05/08/2015   GLUCOSE 87 10/06/2014   CHOL 226 (H) 06/05/2015   TRIG 164 (H) 06/05/2015   HDL 43 06/05/2015   LDLDIRECT 112.0 10/06/2014   LDLCALC 150 (H) 06/05/2015   TSH 2.170 05/08/2015    CMP     Component Value Date/Time   NA 144 11/15/2014   K 4.0 11/15/2014   CL 100 10/06/2014 0930   CO2 32 10/06/2014 0930   GLUCOSE 87 10/06/2014 0930   BUN 14 11/15/2014   CREATININE 0.9 11/15/2014   CREATININE 0.86 10/06/2014 0930   CALCIUM 9.7 10/06/2014 0930   PROT 6.3 06/05/2015 1152   ALBUMIN 4.0 06/05/2015 1152   AST 14 06/05/2015 1152   ALT 10 06/05/2015 1152   ALKPHOS 80 06/05/2015 1152   BILITOT 0.6 06/05/2015 1152    Assessment and Plan :  1. Memory impairment Worsening. MMSE 24/30. Discussed with patient and daughter that it will not be safe for patient to drive anymore, not safe-patient states that she will think about it. I think it will be a good idea for patient to assign her daughters as power of attorneys and help her with paying bills on time. Patient is ok to mow her yard as long as she is been careful and not  cutting herself. She is ok to stay at home at night for now there does not seem to be a concern with that right now. Follow. More than 50% of visit is spent in counseling which is quite extensive with family. 2. Essential hypertension Elevated today. Will stop Maxzide and start Losartan HCTZ. Continue Amlodipine. Also advised patient to decrease salt intake.  3. Anxiety and depression Stable.  4. Hyperlipidemia Stop Pravastatin, this could be contributing to memory issues also.  Patient was seen and examined by Dr. Eulas Post and note was scribed by Theressa Millard, RMA.    Richard Rosanna Randy  MD [facility] 01/23/2016 4:47 PM

## 2016-02-12 ENCOUNTER — Ambulatory Visit: Payer: Medicare Other | Admitting: Family Medicine

## 2016-02-12 DIAGNOSIS — I10 Essential (primary) hypertension: Secondary | ICD-10-CM | POA: Diagnosis not present

## 2016-02-12 DIAGNOSIS — S90412A Abrasion, left great toe, initial encounter: Secondary | ICD-10-CM | POA: Diagnosis not present

## 2016-02-12 DIAGNOSIS — S92414A Nondisplaced fracture of proximal phalanx of right great toe, initial encounter for closed fracture: Secondary | ICD-10-CM | POA: Diagnosis not present

## 2016-02-12 DIAGNOSIS — S92422A Displaced fracture of distal phalanx of left great toe, initial encounter for closed fracture: Secondary | ICD-10-CM | POA: Diagnosis not present

## 2016-02-15 ENCOUNTER — Encounter: Payer: Self-pay | Admitting: Family Medicine

## 2016-02-15 ENCOUNTER — Ambulatory Visit (INDEPENDENT_AMBULATORY_CARE_PROVIDER_SITE_OTHER): Payer: Medicare Other | Admitting: Family Medicine

## 2016-02-15 VITALS — BP 146/92 | HR 80 | Temp 98.5°F | Resp 16 | Wt 140.0 lb

## 2016-02-15 DIAGNOSIS — S90415D Abrasion, left lesser toe(s), subsequent encounter: Secondary | ICD-10-CM

## 2016-02-15 DIAGNOSIS — S92912D Unspecified fracture of left toe(s), subsequent encounter for fracture with routine healing: Secondary | ICD-10-CM | POA: Diagnosis not present

## 2016-02-15 MED ORDER — DOXYCYCLINE HYCLATE 100 MG PO TABS: 100.0000 mg | ORAL_TABLET | Freq: Two times a day (BID) | ORAL | 0 refills | Status: DC

## 2016-02-15 NOTE — Progress Notes (Signed)
Patient: Pam Keller Female    DOB: 1941/05/02   75 y.o.   MRN: [id number] Visit Date: 02/15/2016  Today's Provider: Wilhemena Durie, MD   Chief Complaint  Patient presents with  . Follow-up   Subjective:    HPI     Follow up ER visit  Patient was seen in ER for left toe fracture, abrasion on 02/12/2016. [facility]). She was treated for left toe fracture, abrasion. Treatment for this included Td shot, wound cleaning. She reports this condition is Improved. Daughter is concerned the abrasion on pt's left great toe had some yellow discharge. ------------------------------------------------------------------------------------    Allergies  Allergen Reactions  . Meperidine Other (See Comments)    Other Reaction: GI Upset  . Clindamycin/Lincomycin   . Codeine   . Demerol   . Penicillins   . Sulfa Antibiotics    Current Meds  Medication Sig  . amLODipine (NORVASC) 5 MG tablet TAKE 1 TABLET (5 MG TOTAL) BY MOUTH DAILY.  Marland Kitchen aspirin 325 MG tablet Take 325 mg by mouth daily.    Marland Kitchen losartan-hydrochlorothiazide (HYZAAR) 100-25 MG tablet Take 1 tablet by mouth daily.  . [DISCONTINUED] sertraline (ZOLOFT) 50 MG tablet Take 50 mg by mouth daily.    Review of Systems  Constitutional: Negative for activity change, appetite change, chills, diaphoresis, fatigue, fever and unexpected weight change.  Eyes: Negative.   Respiratory: Negative for shortness of breath.   Cardiovascular: Positive for chest pain (on Tuesday night). Negative for palpitations and leg swelling.  Gastrointestinal: Positive for vomiting (on Tuesday night).  Endocrine: Negative.   Musculoskeletal: Positive for joint swelling.  Skin: Positive for wound.  Allergic/Immunologic: Negative.   Psychiatric/Behavioral: Positive for confusion.    Social History  Substance Use Topics  . Smoking status: Former Smoker    Packs/day: 0.50    Years: 48.00    Types: Cigarettes    Quit  date: 04/29/2007  . Smokeless tobacco: Never Used  . Alcohol use 0.0 oz/week     Comment: once or a twice a year.   Objective:   BP (!) 146/92 (BP Location: Right Arm, Patient Position: Sitting, Cuff Size: Normal)   Pulse 80   Temp 98.5 F (36.9 C) (Oral)   Resp 16   Wt 140 lb (63.5 kg)   BMI 24.22 kg/m   Physical Exam  Constitutional: She is oriented to person, place, and time. She appears well-developed and well-nourished.  HENT:  Head: Normocephalic and atraumatic.  Right Ear: External ear normal.  Left Ear: External ear normal.  Nose: Nose normal.  Eyes: Conjunctivae are normal. No scleral icterus.  Neck: No thyromegaly present.  Cardiovascular: Normal rate, regular rhythm and normal heart sounds.   Pulmonary/Chest: Effort normal and breath sounds normal.  Musculoskeletal: She exhibits edema (left foot).  Bruising present on left foot  Lymphadenopathy:    She has no cervical adenopathy.  Neurological: She is alert and oriented to person, place, and time.  Skin: Skin is warm and dry.  Abrasion without obvious infection of second toe.Abrasion about 73mm across.  Psychiatric: She has a normal mood and affect. Her behavior is normal.        Assessment & Plan:     1. Toe abrasion, left, subsequent encounter Printed off prescription for Doxy to use if needed. FU PRN. - doxycycline (VIBRA-TABS) 100 MG tablet; Take 1 tablet (100 mg total) by mouth 2 (two) times daily.  Dispense: 10 tablet; Refill: 0  2. Toe fracture, left, with routine healing, subsequent encounter Keep wound clean, and use boot. FU with ortho as scheduled. Patient has appointment with Dr. Sabra Heck. Told her to wear her boot until then.    Patient seen and examined by Miguel Aschoff, MD, and note scribed by Renaldo Fiddler, CMA.   Richard Cranford Mon, MD  [facility]

## 2016-02-15 NOTE — Patient Instructions (Addendum)
Follow up with Orthopedics as scheduled. Wear boot until Dr. Sabra Heck says otherwise. Use antibiotic prescription if needed.    Laceration Care, Adult A laceration is a cut that goes through all of the layers of the skin and into the tissue that is right under the skin. Some lacerations heal on their own. Others need to be closed with stitches (sutures), staples, skin adhesive strips, or skin glue. Proper laceration care minimizes the risk of infection and helps the laceration to heal better. HOW TO CARE FOR YOUR LACERATION If sutures or staples were used:  Keep the wound clean and dry.  If you were given a bandage (dressing), you should change it at least one time per day or as told by your health care provider. You should also change it if it becomes wet or dirty.  Keep the wound completely dry for the first 24 hours or as told by your health care provider. After that time, you may shower or bathe. However, make sure that the wound is not soaked in water until after the sutures or staples have been removed.  Clean the wound one time each day or as told by your health care provider:  Wash the wound with soap and water.  Rinse the wound with water to remove all soap.  Pat the wound dry with a clean towel. Do not rub the wound.  After cleaning the wound, apply a thin layer of antibiotic ointmentas told by your health care provider. This will help to prevent infection and keep the dressing from sticking to the wound.  Have the sutures or staples removed as told by your health care provider. If skin adhesive strips were used:  Keep the wound clean and dry.  If you were given a bandage (dressing), you should change it at least one time per day or as told by your health care provider. You should also change it if it becomes dirty or wet.  Do not get the skin adhesive strips wet. You may shower or bathe, but be careful to keep the wound dry.  If the wound gets wet, pat it dry with a  clean towel. Do not rub the wound.  Skin adhesive strips fall off on their own. You may trim the strips as the wound heals. Do not remove skin adhesive strips that are still stuck to the wound. They will fall off in time. If skin glue was used:  Try to keep the wound dry, but you may briefly wet it in the shower or bath. Do not soak the wound in water, such as by swimming.  After you have showered or bathed, gently pat the wound dry with a clean towel. Do not rub the wound.  Do not do any activities that will make you sweat heavily until the skin glue has fallen off on its own.  Do not apply liquid, cream, or ointment medicine to the wound while the skin glue is in place. Using those may loosen the film before the wound has healed.  If you were given a bandage (dressing), you should change it at least one time per day or as told by your health care provider. You should also change it if it becomes dirty or wet.  If a dressing is placed over the wound, be careful not to apply tape directly over the skin glue. Doing that may cause the glue to be pulled off before the wound has healed.  Do not pick at the glue. The skin glue  usually remains in place for 5-10 days, then it falls off of the skin. General Instructions  Take over-the-counter and prescription medicines only as told by your health care provider.  If you were prescribed an antibiotic medicine or ointment, take or apply it as told by your doctor. Do not stop using it even if your condition improves.  To help prevent scarring, make sure to cover your wound with sunscreen whenever you are outside after stitches are removed, after adhesive strips are removed, or when glue remains in place and the wound is healed. Make sure to wear a sunscreen of at least 30 SPF.  Do not scratch or pick at the wound.  Keep all follow-up visits as told by your health care provider. This is important.  Check your wound every day for signs of infection.  Watch for:  Redness, swelling, or pain.  Fluid, blood, or pus.  Raise (elevate) the injured area above the level of your heart while you are sitting or lying down, if possible. SEEK MEDICAL CARE IF:  You received a tetanus shot and you have swelling, severe pain, redness, or bleeding at the injection site.  You have a fever.  A wound that was closed breaks open.  You notice a bad smell coming from your wound or your dressing.  You notice something coming out of the wound, such as wood or glass.  Your pain is not controlled with medicine.  You have increased redness, swelling, or pain at the site of your wound.  You have fluid, blood, or pus coming from your wound.  You notice a change in the color of your skin near your wound.  You need to change the dressing frequently due to fluid, blood, or pus draining from the wound.  You develop a new rash.  You develop numbness around the wound. SEEK IMMEDIATE MEDICAL CARE IF:  You develop severe swelling around the wound.  Your pain suddenly increases and is severe.  You develop painful lumps near the wound or on skin that is anywhere on your body.  You have a red streak going away from your wound.  The wound is on your hand or foot and you cannot properly move a finger or toe.  The wound is on your hand or foot and you notice that your fingers or toes look pale or bluish.   This information is not intended to replace advice given to you by your health care provider. Make sure you discuss any questions you have with your health care provider.   Document Released: 06/10/2005 Document Revised: 10/25/2014 Document Reviewed: 06/06/2014 Elsevier Interactive Patient Education [employer].

## 2016-02-19 DIAGNOSIS — S92422A Displaced fracture of distal phalanx of left great toe, initial encounter for closed fracture: Secondary | ICD-10-CM | POA: Diagnosis not present

## 2016-03-27 ENCOUNTER — Ambulatory Visit (INDEPENDENT_AMBULATORY_CARE_PROVIDER_SITE_OTHER): Payer: Medicare Other | Admitting: Family Medicine

## 2016-03-27 ENCOUNTER — Encounter: Payer: Self-pay | Admitting: Family Medicine

## 2016-03-27 VITALS — BP 162/88 | HR 86 | Temp 98.2°F | Resp 16 | Wt 135.0 lb

## 2016-03-27 DIAGNOSIS — F418 Other specified anxiety disorders: Secondary | ICD-10-CM | POA: Diagnosis not present

## 2016-03-27 DIAGNOSIS — E785 Hyperlipidemia, unspecified: Secondary | ICD-10-CM

## 2016-03-27 DIAGNOSIS — I10 Essential (primary) hypertension: Secondary | ICD-10-CM

## 2016-03-27 DIAGNOSIS — Z23 Encounter for immunization: Secondary | ICD-10-CM | POA: Diagnosis not present

## 2016-03-27 DIAGNOSIS — R413 Other amnesia: Secondary | ICD-10-CM | POA: Diagnosis not present

## 2016-03-27 DIAGNOSIS — F419 Anxiety disorder, unspecified: Secondary | ICD-10-CM

## 2016-03-27 DIAGNOSIS — J309 Allergic rhinitis, unspecified: Secondary | ICD-10-CM | POA: Diagnosis not present

## 2016-03-27 DIAGNOSIS — F329 Major depressive disorder, single episode, unspecified: Secondary | ICD-10-CM

## 2016-03-27 MED ORDER — FLUTICASONE PROPIONATE 50 MCG/ACT NA SUSP: 2.0000 | Freq: Every day | NASAL | 12 refills | Status: DC

## 2016-03-27 MED ORDER — DONEPEZIL HCL 5 MG PO TABS: 5.0000 mg | ORAL_TABLET | Freq: Every day | ORAL | 12 refills | Status: DC

## 2016-03-27 NOTE — Patient Instructions (Signed)
Re-start Aricept 

## 2016-03-27 NOTE — Progress Notes (Signed)
Pam Keller  MRN: [id number] DOB: 05/30/1941  Subjective:  HPI  Patient is here for 2 months follow up from 01/23/16.  Hypertension: Stopped Maxzide and started Losartan HCTZ on her last visit.  Hyperlipidemia: stopped Pravastatin to see if this contributed to memory issues. Last MMSE was done then 24/30. Patient Active Problem List   Diagnosis Date Noted  . Memory impairment 12/17/2014  . Anxiety and depression 12/17/2014  . Long-term use of high-risk medication 10/06/2014  . Hypokalemia 11/19/2013  . Chronic insomnia 10/05/2013  . Carotid bruit present 10/04/2013  . Occipital headache 09/29/2012  . Neck stiffness 09/08/2012  . 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 Screening for colon cancer 09/29/2011  . Medicare annual wellness visit, subsequent 09/27/2011  . Hyperlipidemia   . Hypertension   . Pulmonary nodule   . Basal cell carcinoma   . Screening for breast cancer 04/29/2011  . Screening for cervical cancer 04/29/2011  . History of postmenopausal HRT 04/29/2011  . Microscopic hematuria   . 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 Basal cell carcinoma   . Hyperlipidemia   . Hypertension   . Microscopic hematuria 2010   negative workuo  . Pulmonary nodule     Social History   Social History  . Marital status: Widowed    Spouse name: Timmothy Sours  . Number of children: N/A  . Years of education: N/A   Occupational History  . Not on file.   Social History Main Topics  . Smoking status: Former Smoker    Packs/day: 0.50    Years: 48.00    Types: Cigarettes    Quit date: 04/29/2007  . Smokeless tobacco: Never Used  . Alcohol use 0.0 oz/week     Comment: once or a twice a year.  . Drug use: No  . Sexual activity: No   Other Topics Concern  . Not on file   Social History Narrative   Retired Tour manager          Outpatient Encounter Prescriptions as of 03/27/2016  Medication Sig Note  . amLODipine (NORVASC) 5 MG tablet TAKE 1  TABLET (5 MG TOTAL) BY MOUTH DAILY.   Marland Kitchen aspirin 325 MG tablet Take 325 mg by mouth daily.     Marland Kitchen donepezil (ARICEPT) 5 MG tablet Take 1 tablet (5 mg total) by mouth at bedtime.   . fluticasone (FLONASE) 50 MCG/ACT nasal spray Place 2 sprays into both nostrils daily. PRN   . losartan-hydrochlorothiazide (HYZAAR) 100-25 MG tablet Take 1 tablet by mouth daily.   . [DISCONTINUED] donepezil (ARICEPT) 5 MG tablet Take 1 tablet (5 mg total) by mouth at bedtime.   . [DISCONTINUED] doxycycline (VIBRA-TABS) 100 MG tablet Take 1 tablet (100 mg total) by mouth 2 (two) times daily.   . [DISCONTINUED] fluticasone (FLONASE) 50 MCG/ACT nasal spray PRN 01/03/2015: Received from: [facility]   No facility-administered encounter medications on file as of 03/27/2016.     Allergies  Allergen Reactions  . Meperidine Other (See Comments)    Other Reaction: GI Upset  . Clindamycin/Lincomycin   . Codeine   . Demerol   . Penicillins   . Sulfa Antibiotics     Review of Systems  Constitutional: Negative.   HENT: Negative.   Eyes: Negative.   Respiratory: Negative.   Cardiovascular: Negative.   Gastrointestinal: Negative.   Genitourinary: Negative.   Musculoskeletal: Negative.   Skin: Negative.   Neurological: Negative.   Endo/Heme/Allergies: Negative.  Psychiatric/Behavioral: Positive for memory loss.   Objective:  BP (!) 162/88 (BP Location: Right Arm, Patient Position: Sitting, Cuff Size: Normal)   Pulse 86   Temp 98.2 F (36.8 C) (Oral)   Resp 16   Wt 135 lb (61.2 kg)   BMI 23.35 kg/m   Physical Exam  Constitutional: She is oriented to person, place, and time and well-developed, well-nourished, and in no distress.  HENT:  Head: Normocephalic and atraumatic.  Eyes: Conjunctivae and EOM are normal. Pupils are equal, round, and reactive to light.  Neck: Normal range of motion. Neck supple.  Cardiovascular: Normal rate, regular rhythm, normal heart sounds and intact distal  pulses.   Pulmonary/Chest: Effort normal and breath sounds normal.  Neurological: She is alert and oriented to person, place, and time. She exhibits normal muscle tone. Gait normal. Coordination normal. GCS score is 15.  Grossly nonfocal exam. She has obvious cognitive difficulties.  Skin: Skin is warm and dry.  Psychiatric: Mood normal.    Assessment and Plan :  Essential hypertension - Plan: CBC with Differential/Platelet, TSH  Memory impairment - Plan: donepezil (ARICEPT) 5 MG tablet  Need for influenza vaccination - Plan: Flu vaccine HIGH DOSE PF  Allergic rhinitis, unspecified chronicity, unspecified seasonality, unspecified trigger - Plan: fluticasone (FLONASE) 50 MCG/ACT nasal spray  Hyperlipidemia, unspecified hyperlipidemia type - Plan: Lipid Panel With LDL/HDL Ratio, Comprehensive metabolic panel  Anxiety and depression   HTN- still elevated will follow for now. Pt has not been taking Aricept. Discussed taking the Aricept. May increase this in the future. May try Namenda.  More than 50% of this 25 minute visit is spent in counseling regarding dementia and progression of disease.  I have done the exam and reviewed the chart and it is accurate to the best of my knowledge. Miguel Aschoff M.D. [facility]

## 2016-04-05 DIAGNOSIS — E785 Hyperlipidemia, unspecified: Secondary | ICD-10-CM | POA: Diagnosis not present

## 2016-04-05 DIAGNOSIS — I10 Essential (primary) hypertension: Secondary | ICD-10-CM | POA: Diagnosis not present

## 2016-04-06 LAB — CBC WITH DIFFERENTIAL/PLATELET
Basophils Absolute: 0 10*3/uL (ref 0.0–0.2)
Basos: 0 %
EOS (ABSOLUTE): 0.1 10*3/uL (ref 0.0–0.4)
EOS: 1 %
HEMOGLOBIN: 11.7 g/dL (ref 11.1–15.9)
Hematocrit: 34.6 % (ref 34.0–46.6)
IMMATURE GRANS (ABS): 0 10*3/uL (ref 0.0–0.1)
IMMATURE GRANULOCYTES: 0 %
LYMPHS ABS: 1.4 10*3/uL (ref 0.7–3.1)
LYMPHS: 19 %
MCH: 26.9 pg (ref 26.6–33.0)
MCHC: 33.8 g/dL (ref 31.5–35.7)
MCV: 80 fL (ref 79–97)
MONOCYTES: 6 %
Monocytes Absolute: 0.5 10*3/uL (ref 0.1–0.9)
Neutrophils Absolute: 5.5 10*3/uL (ref 1.4–7.0)
Neutrophils: 74 %
PLATELETS: 272 10*3/uL (ref 150–379)
RBC: 4.35 x10E6/uL (ref 3.77–5.28)
RDW: 14.8 % (ref 12.3–15.4)
WBC: 7.4 10*3/uL (ref 3.4–10.8)

## 2016-04-06 LAB — COMPREHENSIVE METABOLIC PANEL
ALBUMIN: 4.2 g/dL (ref 3.5–4.8)
ALK PHOS: 87 IU/L (ref 39–117)
ALT: 9 IU/L (ref 0–32)
AST: 18 IU/L (ref 0–40)
Albumin/Globulin Ratio: 1.6 (ref 1.2–2.2)
BUN / CREAT RATIO: 14 (ref 12–28)
BUN: 13 mg/dL (ref 8–27)
Bilirubin Total: 0.7 mg/dL (ref 0.0–1.2)
CALCIUM: 9.8 mg/dL (ref 8.7–10.3)
CO2: 29 mmol/L (ref 18–29)
CREATININE: 0.95 mg/dL (ref 0.57–1.00)
Chloride: 96 mmol/L (ref 96–106)
GFR calc Af Amer: 67 mL/min/{1.73_m2} (ref >59)
GFR, EST NON AFRICAN AMERICAN: 58 mL/min/{1.73_m2} — AB (ref >59)
GLOBULIN, TOTAL: 2.6 g/dL (ref 1.5–4.5)
GLUCOSE: 109 mg/dL — AB (ref 65–99)
Potassium: 3.7 mmol/L (ref 3.5–5.2)
Sodium: 139 mmol/L (ref 134–144)
TOTAL PROTEIN: 6.8 g/dL (ref 6.0–8.5)

## 2016-04-06 LAB — LIPID PANEL WITH LDL/HDL RATIO
CHOLESTEROL TOTAL: 221 mg/dL — AB (ref 100–199)
HDL: 44 mg/dL (ref >39)
LDL CALC: 140 mg/dL — AB (ref 0–99)
LDl/HDL Ratio: 3.2 ratio units (ref 0.0–3.2)
TRIGLYCERIDES: 183 mg/dL — AB (ref 0–149)
VLDL Cholesterol Cal: 37 mg/dL (ref 5–40)

## 2016-04-06 LAB — TSH: TSH: 2.51 u[IU]/mL (ref 0.450–4.500)

## 2016-04-09 ENCOUNTER — Telehealth: Payer: Self-pay

## 2016-04-09 NOTE — Telephone Encounter (Signed)
Patient advised as directed below.  Thanks,  -Sister Carbone 

## 2016-04-09 NOTE — Telephone Encounter (Signed)
-----   Message from Jerrol Banana., MD sent at 04/09/2016 11:37 AM EDT ----- Labs stable.

## 2016-05-26 ENCOUNTER — Other Ambulatory Visit: Payer: Self-pay | Admitting: Family Medicine

## 2016-05-26 DIAGNOSIS — R413 Other amnesia: Secondary | ICD-10-CM

## 2016-06-26 ENCOUNTER — Encounter: Payer: Self-pay | Admitting: Family Medicine

## 2016-06-26 NOTE — Progress Notes (Signed)
        To Whom It May Concern,         Mrs. Pam Keller is a patient of mine who has progressive Alzheimer's disease and some macular degeneration. Daughters have been looking in on her daily with this is becoming untenable and impossible to continue to care for the patient at home and this way. The patient needs to be in assisted living and those arrangements are being made. She will require and cleaning her residence. For now I think she is able to eat on her own.   Sincerely,   Eulas Post  M.D.

## 2016-06-27 ENCOUNTER — Ambulatory Visit: Payer: Medicare Other | Admitting: Family Medicine

## 2016-07-01 ENCOUNTER — Ambulatory Visit (INDEPENDENT_AMBULATORY_CARE_PROVIDER_SITE_OTHER): Payer: Medicare Other | Admitting: Family Medicine

## 2016-07-01 ENCOUNTER — Encounter: Payer: Self-pay | Admitting: Family Medicine

## 2016-07-01 VITALS — BP 180/92 | HR 84 | Temp 97.9°F | Resp 16 | Wt 136.0 lb

## 2016-07-01 DIAGNOSIS — F329 Major depressive disorder, single episode, unspecified: Secondary | ICD-10-CM

## 2016-07-01 DIAGNOSIS — R413 Other amnesia: Secondary | ICD-10-CM | POA: Diagnosis not present

## 2016-07-01 DIAGNOSIS — F32A Depression, unspecified: Secondary | ICD-10-CM

## 2016-07-01 DIAGNOSIS — Z022 Encounter for examination for admission to residential institution: Secondary | ICD-10-CM

## 2016-07-01 DIAGNOSIS — I10 Essential (primary) hypertension: Secondary | ICD-10-CM | POA: Diagnosis not present

## 2016-07-01 DIAGNOSIS — F418 Other specified anxiety disorders: Secondary | ICD-10-CM | POA: Diagnosis not present

## 2016-07-01 DIAGNOSIS — F419 Anxiety disorder, unspecified: Secondary | ICD-10-CM

## 2016-07-01 NOTE — Progress Notes (Signed)
Subjective:  HPI Pt is here today for a follow up of chronic problems. She reports that she has been feeling well. Pt daughters have noticed memory worsening, but seems it may be a touchy situation when discussed with pt.   Patient is having progression of dementia but is adamant about maintaining her independence. Her daughters are having to do just about everything for her to keep her safe.  Prior to Admission medications   Medication Sig Start Date End Date Taking? Authorizing Provider  amLODipine (NORVASC) 5 MG tablet TAKE 1 TABLET (5 MG TOTAL) BY MOUTH DAILY. 08/14/15   Crecencio Mc, MD  aspirin 325 MG tablet Take 325 mg by mouth daily.      Historical Provider, MD  donepezil (ARICEPT) 5 MG tablet Take 1 tablet (5 mg total) by mouth at bedtime. 03/27/16   Patricio Popwell Maceo Pro., MD  donepezil (ARICEPT) 5 MG tablet TAKE 1 TABLET (5 MG TOTAL) BY MOUTH AT BEDTIME. 05/27/16   Jerrol Banana., MD  fluticasone [facility]) 50 MCG/ACT nasal spray Place 2 sprays into both nostrils daily. PRN 03/27/16   Jerrol Banana., MD  losartan-hydrochlorothiazide (HYZAAR) 100-25 MG tablet Take 1 tablet by mouth daily. 01/23/16   Tashi Andujo Maceo Pro., MD    Patient Active Problem List   Diagnosis Date Noted  . Memory impairment 12/17/2014  . Anxiety and depression 12/17/2014  . Long-term use of high-risk medication 10/06/2014  . Hypokalemia 11/19/2013  . Chronic insomnia 10/05/2013  . Carotid bruit present 10/04/2013  . Occipital headache 09/29/2012  . Neck stiffness 09/08/2012  . 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 Screening for colon cancer 09/29/2011  . Medicare annual wellness visit, subsequent 09/27/2011  . Hyperlipidemia   . Hypertension   . Pulmonary nodule   . Basal cell carcinoma   . Screening for breast cancer 04/29/2011  . Screening for cervical cancer 04/29/2011  . History of postmenopausal HRT 04/29/2011  . Microscopic hematuria   . 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 Basal cell carcinoma   . Hyperlipidemia   . Hypertension   . Microscopic hematuria 2010   negative workuo  . Pulmonary nodule     Social History   Social History  . Marital status: Widowed    Spouse name: Timmothy Sours  . Number of children: N/A  . Years of education: N/A   Occupational History  . Not on file.   Social History Main Topics  . Smoking status: Former Smoker    Packs/day: 0.50    Years: 48.00    Types: Cigarettes    Quit date: 04/29/2007  . Smokeless tobacco: Never Used  . Alcohol use 0.0 oz/week     Comment: once or a twice a year.  . Drug use: No  . Sexual activity: No   Other Topics Concern  . Not on file   Social History Narrative   Retired Tour manager          Allergies  Allergen Reactions  . Meperidine Other (See Comments)    Other Reaction: GI Upset  . Clindamycin/Lincomycin   . Codeine   . Demerol   . Penicillins   . Sulfa Antibiotics     Review of Systems  Constitutional: Negative.   HENT: Negative.   Eyes: Negative.   Respiratory: Negative.   Cardiovascular: Negative.   Gastrointestinal: Negative.   Genitourinary: Negative.   Musculoskeletal: Negative.   Skin: Negative.   Neurological:  Negative.   Endo/Heme/Allergies: Negative.   Psychiatric/Behavioral: Positive for memory loss.    Immunization History  Administered Date(s) Administered  . Influenza Split 03/10/2012, 03/16/2014  . Influenza, High Dose Seasonal PF 03/27/2016  . Influenza-Unspecified 03/24/2013, 02/23/2015  . Pneumococcal Conjugate-13 10/04/2013  . Pneumococcal Polysaccharide-23 03/06/2012  . Td 02/12/2016  . Tdap 03/07/2011  . Zoster 10/04/2008    Objective:  BP (!) 180/92 (BP Location: Left Arm, Patient Position: Sitting, Cuff Size: Normal)   Pulse 84   Temp 97.9 F (36.6 C) (Oral)   Resp 16   Wt 136 lb (61.7 kg)   BMI 23.53 kg/m   Physical Exam  Constitutional: She is oriented to person, place, and time and  well-developed, well-nourished, and in no distress.  HENT:  Head: Normocephalic and atraumatic.  Eyes: Conjunctivae and EOM are normal. Pupils are equal, round, and reactive to light.  Neck: Normal range of motion. Neck supple. No thyromegaly present.  Cardiovascular: Normal rate, regular rhythm, normal heart sounds and intact distal pulses.   Pulmonary/Chest: Effort normal and breath sounds normal.  Abdominal: Soft.  Musculoskeletal: Normal range of motion.  Neurological: She is alert and oriented to person, place, and time. She has normal reflexes. Gait normal. GCS score is 15.  Skin: Skin is warm and dry.  Psychiatric: Mood, memory, affect and judgment normal.    Lab Results  Component Value Date   WBC 7.4 04/05/2016   HGB 13.5 11/15/2014   HCT 34.6 04/05/2016   PLT 272 04/05/2016   GLUCOSE 109 (H) 04/05/2016   CHOL 221 (H) 04/05/2016   TRIG 183 (H) 04/05/2016   HDL 44 04/05/2016   LDLDIRECT 112.0 10/06/2014   LDLCALC 140 (H) 04/05/2016   TSH 2.510 04/05/2016    CMP     Component Value Date/Time   NA 139 04/05/2016 0841   K 3.7 04/05/2016 0841   CL 96 04/05/2016 0841   CO2 29 04/05/2016 0841   GLUCOSE 109 (H) 04/05/2016 0841   GLUCOSE 87 10/06/2014 0930   BUN 13 04/05/2016 0841   CREATININE 0.95 04/05/2016 0841   CALCIUM 9.8 04/05/2016 0841   PROT 6.8 04/05/2016 0841   ALBUMIN 4.2 04/05/2016 0841   AST 18 04/05/2016 0841   ALT 9 04/05/2016 0841   ALKPHOS 87 04/05/2016 0841   BILITOT 0.7 04/05/2016 0841   GFRNONAA 58 (L) 04/05/2016 0841   GFRAA 67 04/05/2016 0841    Assessment and Plan :  1. Memory impairment MMSE today 20/30. Worse. Daughters are trying to get pt placed in a memory unit. I will fill out appropriate paper work.  More than 30 minutes spent on today's visit. 2. Essential hypertension Elevated, but daughters do not think she is taking her medication at all. Daughters to take over dispensing medication at least until she can get placed in a  facility.   3. Anxiety and depression   4. Encounter for examination for admission to assisted living facility [facility] form filled out for [facility]. I actually think she needs a memory care unit that is locked off. I think she will be at risk for wandering. - Quantiferon tb gold assay   HPI, Exam, and A&P Transcribed under the direction and in the presence of Danene Montijo L. Cranford Mon, MD  Electronically Signed: Webb Laws, CMA I have done the exam and reviewed the above chart and it is accurate to the best of my knowledge. Development worker, community has been used in this note in any air is  in the dictation or transcription are unintentional.  [facility] 07/01/2016 11:14 AM

## 2016-07-03 LAB — QUANTIFERON IN TUBE
QFT TB AG MINUS NIL VALUE: 0 IU/mL
QUANTIFERON MITOGEN VALUE: >10 IU/mL
QUANTIFERON TB AG VALUE: 0.03 [IU]/mL
QUANTIFERON TB GOLD: NEGATIVE
Quantiferon Nil Value: 0.03 IU/mL

## 2016-07-03 LAB — QUANTIFERON TB GOLD ASSAY (BLOOD)

## 2016-07-08 ENCOUNTER — Other Ambulatory Visit: Payer: Self-pay | Admitting: Family Medicine

## 2016-07-09 ENCOUNTER — Telehealth: Payer: Self-pay | Admitting: Family Medicine

## 2016-07-09 NOTE — Telephone Encounter (Signed)
Will re fax form, they did not receive it-aa

## 2016-07-09 NOTE — Telephone Encounter (Signed)
Melissa at [facility] called saying they faxed a form to [place] yesterday that Dr. Rosanna Randy needs to sign  for her medications to be given.  Melissa said their fax number should be on the order.  They are just calling to make sure we have received the fax and that Dr. Rosanna Randy has signed and faxed it back.  Thanks [employer]

## 2016-07-29 ENCOUNTER — Telehealth: Payer: Self-pay

## 2016-07-29 NOTE — Telephone Encounter (Signed)
Dr Rosanna Randy. Let us know when form for patient is completed for life insurance I think. [place] gave you this last week on Thursday I think. Thank you-aa

## 2016-07-30 NOTE — Telephone Encounter (Signed)
forms complete. Up front ready for pick up.

## 2016-07-30 NOTE — Telephone Encounter (Signed)
I think it is upfront.

## 2016-07-30 NOTE — Telephone Encounter (Signed)
He is currently filling them out.

## 2016-10-10 ENCOUNTER — Ambulatory Visit: Payer: Medicare Other

## 2016-10-10 ENCOUNTER — Ambulatory Visit: Payer: Medicare Other | Admitting: Family Medicine

## 2016-11-12 ENCOUNTER — Telehealth: Payer: Self-pay

## 2016-11-12 NOTE — Telephone Encounter (Signed)
Please review,-aa

## 2016-11-12 NOTE — Telephone Encounter (Signed)
Nurse advised and she will let the doctor there know and her manager.-aa

## 2016-11-12 NOTE — Telephone Encounter (Signed)
I think it might be better for pt to have facility doctor take over her care,Lori--her daughter and I discussed this.

## 2016-11-12 NOTE — Telephone Encounter (Signed)
Dr Gilford Raid, patient;s daughter called and ask if patient can have an appointment for Thursday just in case patient does not get in with the doctor at [facility] in the program for them to take over. This is for her swelling. They may not need the appointment but wanted to make sure. Not tomorrow but for Thursday they said. Please review-aa. In the afternoon if possible

## 2016-11-12 NOTE — Telephone Encounter (Signed)
Nurse from [facility] called for patient Pam Keller, reports that pt is having swelling around feet and ankles x's 1 week. Patient has not complained of any shortness of breath, chest pain or head ache. Nurse reports that pt is taking her medications and no changes in diet. I did ask about any weight changes and reports that weight is checked weekly. Patient weighed 134 lb on 5/12 and 142 on 5/19. Nurse reports that she will call patients family to ask if they can bring her for an office visit. Please advise. sd

## 2016-11-14 ENCOUNTER — Ambulatory Visit (INDEPENDENT_AMBULATORY_CARE_PROVIDER_SITE_OTHER): Payer: Medicare Other | Admitting: Physician Assistant

## 2016-11-14 ENCOUNTER — Encounter: Payer: Self-pay | Admitting: Physician Assistant

## 2016-11-14 VITALS — BP 120/68 | HR 79 | Temp 98.9°F | Wt 139.8 lb

## 2016-11-14 DIAGNOSIS — I10 Essential (primary) hypertension: Secondary | ICD-10-CM | POA: Diagnosis not present

## 2016-11-14 DIAGNOSIS — R600 Localized edema: Secondary | ICD-10-CM | POA: Diagnosis not present

## 2016-11-14 NOTE — Progress Notes (Signed)
Patient: Pam Keller Female    DOB: 30-Apr-1941   76 y.o.   MRN: [id number] Visit Date: 11/15/2016  Today's Provider: Trinna Post, PA-C   Chief Complaint  Patient presents with  . Leg Swelling   Subjective:    HPI   Pam Keller is a pleasant 76 year old woman with a PMH significant for cognitive impairment, HTN on Norvasc and Hyzaar, and history of tobacco abuse presenting today to discuss fluctuating weight since January. She is here with her daughter. The daughter provides much of the history.   Pam Keller moved into [facility] assisted living in January. Prior to that, she was living independently. Daughter reports that she would typically eat dinners but throughout the day would be inconsistent with her eating. Since moving to assisted living, Pam Keller reports she very much enjoys the food and eats more than before.   She is weighed once weekly. I have reviewed the weight chart and scanned it into the computer. Most recently there was a six pound weight gain in on week. Prior to that, there had been a steady upward trend from about 125 lbs to 140 lbs today. There were prior six pound fluctuations down as well.   Today, Pam Keller reports she has no chest pain, SOB, palpitations, PND, leg pain or cramping, nocturia. She denies history of MI. She says she is on her feet all day and doesn't sit down until night. She is constantly walking around. She has tried compression stockings before and does not like them. At the end of the day, she has some swelling in her bilateral lower extremities. She does have HTN and is on Hyzaar and Norvasc. Of note, the daughter reports that since moving to assisted living she has been taking her BP medication consistently.     Previous Medications   AMLODIPINE (NORVASC) 5 MG TABLET    TAKE 1 TABLET (5 MG TOTAL) BY MOUTH DAILY.   ASPIRIN 325 MG TABLET    Take 325 mg by mouth daily.     DONEPEZIL (ARICEPT) 5 MG TABLET    TAKE 1 TABLET (5 MG  TOTAL) BY MOUTH AT BEDTIME.   FLUTICASONE (FLONASE) 50 MCG/ACT NASAL SPRAY    Place 2 sprays into both nostrils daily. PRN   LOSARTAN-HYDROCHLOROTHIAZIDE (HYZAAR) 100-25 MG TABLET    Take 1 tablet by mouth daily.   SERTRALINE (ZOLOFT) 50 MG TABLET    Take 50 mg by mouth daily.    Review of Systems  Constitutional: Positive for unexpected weight change.  Respiratory: Negative.   Cardiovascular: Positive for leg swelling.    Social History  Substance Use Topics  . Smoking status: Former Smoker    Packs/day: 0.50    Years: 48.00    Types: Cigarettes    Quit date: 04/29/2007  . Smokeless tobacco: Never Used  . Alcohol use 0.0 oz/week     Comment: once or a twice a year.   Objective:   BP 120/68 (BP Location: Right Arm, Patient Position: Sitting, Cuff Size: Normal)   Pulse 79   Temp 98.9 F (37.2 C) (Oral)   Wt 139 lb 12.8 oz (63.4 kg)   SpO2 98%   BMI 24.19 kg/m   Physical Exam  Constitutional: She is oriented to person, place, and time. She appears well-developed and well-nourished. No distress.  Cardiovascular: Normal rate, regular rhythm, normal heart sounds and intact distal pulses.   Pulmonary/Chest: Effort normal and breath sounds normal. She has no rales.  Abdominal: Soft. Bowel sounds are normal. She exhibits no distension, no fluid wave and no ascites. There is no tenderness. There is no rebound and no guarding.  Musculoskeletal: She exhibits edema.  Some 1+ pitting edema of bilateral lower extremities.   Neurological: She is alert and oriented to person, place, and time.  Skin: Skin is warm and dry.  Psychiatric: She has a normal mood and affect. Her behavior is normal.        Assessment & Plan:     1. Bilateral lower extremity edema  EKG in office today is normal, doesn't show signs of heart failure. Last CXR in 2012 from Pam Keller showed normal cardiac silhouette. Patient and daughter decide they would like to get BNP as a baseline as I don't see other BNP values  in the chart, but will defer the CXR today. I think this is reasonable. It appears the patient's weight change represents a general increasing weight gain after moving into assisted living. Additionally, she has mild dependent edema that is likely exacerbated by her amlodipine. Patient and daughter know that she can always be further evaluated and would like to continue watchful waiting.   - EKG 12-Lead - B Nat Peptide  2. Essential Hypertension  Well controlled today.  The entirety of the information documented in the History of Present Illness, Review of Systems and Physical Exam were personally obtained by me. Portions of this information were initially documented by Pam Keller, CMA and reviewed by me for thoroughness and accuracy.     Follow up: Return if symptoms worsen or fail to improve.

## 2016-11-14 NOTE — Patient Instructions (Signed)
Edema Edema is when you have too much fluid in your body or under your skin. Edema may make your legs, feet, and ankles swell up. Swelling is also common in looser tissues, like around your eyes. This is a common condition. It gets more common as you get older. There are many possible causes of edema. Eating too much salt (sodium) and being on your feet or sitting for a long time can cause edema in your legs, feet, and ankles. Hot weather may make edema worse. Edema is usually painless. Your skin may look swollen or shiny. Follow these instructions at home:  Keep the swollen body part raised (elevated) above the level of your heart when you are sitting or lying down.  Do not sit still or stand for a long time.  Do not wear tight clothes. Do not wear garters on your upper legs.  Exercise your legs. This can help the swelling go down.  Wear elastic bandages or support stockings as told by your doctor.  Eat a low-salt (low-sodium) diet to reduce fluid as told by your doctor.  Depending on the cause of your swelling, you may need to limit how much fluid you drink (fluid restriction).  Take over-the-counter and prescription medicines only as told by your doctor. Contact a doctor if:  Treatment is not working.  You have heart, liver, or kidney disease and have symptoms of edema.  You have sudden and unexplained weight gain. Get help right away if:  You have shortness of breath or chest pain.  You cannot breathe when you lie down.  You have pain, redness, or warmth in the swollen areas.  You have heart, liver, or kidney disease and get edema all of a sudden.  You have a fever and your symptoms get worse all of a sudden. Summary  Edema is when you have too much fluid in your body or under your skin.  Edema may make your legs, feet, and ankles swell up. Swelling is also common in looser tissues, like around your eyes.  Raise (elevate) the swollen body part above the level of your  heart when you are sitting or lying down.  Follow your doctor's instructions about diet and how much fluid you can drink (fluid restriction). This information is not intended to replace advice given to you by your health care provider. Make sure you discuss any questions you have with your health care provider. Document Released: 11/27/2007 Document Revised: 06/28/2016 Document Reviewed: 06/28/2016 Elsevier Interactive Patient Education  2017 [employer] Inc[employer]  

## 2016-11-15 LAB — BRAIN NATRIURETIC PEPTIDE: BNP: 22.9 pg/mL (ref 0.0–100.0)

## 2016-11-19 ENCOUNTER — Telehealth: Payer: Self-pay

## 2016-11-19 NOTE — Telephone Encounter (Signed)
-----   Message from Trinna Post, [place] sent at 11/19/2016  8:20 AM EDT ----- BNP was normal. Can continue to observe this and see if we need to follow up with anything else. Thank you!

## 2016-11-19 NOTE — Telephone Encounter (Signed)
Unable to reach Pam Keller at [phone]( phone is disconnected), attempted to contact home number at [phone] and voicemail box was full. Will try and contact patient at a later time. KW

## 2016-11-19 NOTE — Telephone Encounter (Signed)
Pam Keller returned call informed her of patients labs as below. KW

## 2016-11-22 DIAGNOSIS — F325 Major depressive disorder, single episode, in full remission: Secondary | ICD-10-CM | POA: Diagnosis not present

## 2016-11-22 DIAGNOSIS — F419 Anxiety disorder, unspecified: Secondary | ICD-10-CM | POA: Diagnosis not present

## 2016-11-22 DIAGNOSIS — G309 Alzheimer's disease, unspecified: Secondary | ICD-10-CM | POA: Diagnosis not present

## 2016-11-22 DIAGNOSIS — I10 Essential (primary) hypertension: Secondary | ICD-10-CM | POA: Diagnosis not present

## 2016-12-06 DIAGNOSIS — H1132 Conjunctival hemorrhage, left eye: Secondary | ICD-10-CM | POA: Diagnosis not present

## 2016-12-20 DIAGNOSIS — G309 Alzheimer's disease, unspecified: Secondary | ICD-10-CM | POA: Diagnosis not present

## 2016-12-20 DIAGNOSIS — J309 Allergic rhinitis, unspecified: Secondary | ICD-10-CM | POA: Diagnosis not present

## 2016-12-20 DIAGNOSIS — F325 Major depressive disorder, single episode, in full remission: Secondary | ICD-10-CM | POA: Diagnosis not present

## 2016-12-20 DIAGNOSIS — I10 Essential (primary) hypertension: Secondary | ICD-10-CM | POA: Diagnosis not present

## 2016-12-24 DIAGNOSIS — R011 Cardiac murmur, unspecified: Secondary | ICD-10-CM | POA: Diagnosis not present

## 2017-01-01 DIAGNOSIS — H35371 Puckering of macula, right eye: Secondary | ICD-10-CM | POA: Diagnosis not present

## 2017-01-01 DIAGNOSIS — H35349 Macular cyst, hole, or pseudohole, unspecified eye: Secondary | ICD-10-CM | POA: Diagnosis not present

## 2017-01-17 DIAGNOSIS — R609 Edema, unspecified: Secondary | ICD-10-CM | POA: Diagnosis not present

## 2017-01-17 DIAGNOSIS — I10 Essential (primary) hypertension: Secondary | ICD-10-CM | POA: Diagnosis not present

## 2017-01-17 DIAGNOSIS — G309 Alzheimer's disease, unspecified: Secondary | ICD-10-CM | POA: Diagnosis not present

## 2017-02-13 DIAGNOSIS — R0989 Other specified symptoms and signs involving the circulatory and respiratory systems: Secondary | ICD-10-CM | POA: Diagnosis not present

## 2017-02-13 DIAGNOSIS — I739 Peripheral vascular disease, unspecified: Secondary | ICD-10-CM | POA: Diagnosis not present

## 2017-02-14 DIAGNOSIS — R609 Edema, unspecified: Secondary | ICD-10-CM | POA: Diagnosis not present

## 2017-02-14 DIAGNOSIS — I10 Essential (primary) hypertension: Secondary | ICD-10-CM | POA: Diagnosis not present

## 2017-02-14 DIAGNOSIS — G309 Alzheimer's disease, unspecified: Secondary | ICD-10-CM | POA: Diagnosis not present

## 2017-02-14 DIAGNOSIS — F325 Major depressive disorder, single episode, in full remission: Secondary | ICD-10-CM | POA: Diagnosis not present

## 2017-02-15 DIAGNOSIS — M79609 Pain in unspecified limb: Secondary | ICD-10-CM | POA: Diagnosis not present

## 2017-02-15 DIAGNOSIS — R221 Localized swelling, mass and lump, neck: Secondary | ICD-10-CM | POA: Diagnosis not present

## 2017-03-13 DIAGNOSIS — E038 Other specified hypothyroidism: Secondary | ICD-10-CM | POA: Diagnosis not present

## 2017-03-13 DIAGNOSIS — E119 Type 2 diabetes mellitus without complications: Secondary | ICD-10-CM | POA: Diagnosis not present

## 2017-03-13 DIAGNOSIS — E559 Vitamin D deficiency, unspecified: Secondary | ICD-10-CM | POA: Diagnosis not present

## 2017-03-13 DIAGNOSIS — Z79899 Other long term (current) drug therapy: Secondary | ICD-10-CM | POA: Diagnosis not present

## 2017-03-13 DIAGNOSIS — D518 Other vitamin B12 deficiency anemias: Secondary | ICD-10-CM | POA: Diagnosis not present

## 2017-03-13 DIAGNOSIS — E782 Mixed hyperlipidemia: Secondary | ICD-10-CM | POA: Diagnosis not present

## 2017-03-21 DIAGNOSIS — F325 Major depressive disorder, single episode, in full remission: Secondary | ICD-10-CM | POA: Diagnosis not present

## 2017-03-21 DIAGNOSIS — I10 Essential (primary) hypertension: Secondary | ICD-10-CM | POA: Diagnosis not present

## 2017-03-21 DIAGNOSIS — F329 Major depressive disorder, single episode, unspecified: Secondary | ICD-10-CM | POA: Diagnosis not present

## 2017-03-21 DIAGNOSIS — G309 Alzheimer's disease, unspecified: Secondary | ICD-10-CM | POA: Diagnosis not present

## 2017-03-21 DIAGNOSIS — E876 Hypokalemia: Secondary | ICD-10-CM | POA: Diagnosis not present

## 2017-03-21 DIAGNOSIS — F0280 Dementia in other diseases classified elsewhere without behavioral disturbance: Secondary | ICD-10-CM | POA: Diagnosis not present

## 2017-03-25 DIAGNOSIS — I10 Essential (primary) hypertension: Secondary | ICD-10-CM | POA: Diagnosis not present

## 2017-03-25 DIAGNOSIS — G309 Alzheimer's disease, unspecified: Secondary | ICD-10-CM | POA: Diagnosis not present

## 2017-03-25 DIAGNOSIS — F0280 Dementia in other diseases classified elsewhere without behavioral disturbance: Secondary | ICD-10-CM | POA: Diagnosis not present

## 2017-03-25 DIAGNOSIS — F329 Major depressive disorder, single episode, unspecified: Secondary | ICD-10-CM | POA: Diagnosis not present

## 2017-04-03 DIAGNOSIS — G309 Alzheimer's disease, unspecified: Secondary | ICD-10-CM | POA: Diagnosis not present

## 2017-04-03 DIAGNOSIS — I10 Essential (primary) hypertension: Secondary | ICD-10-CM | POA: Diagnosis not present

## 2017-04-03 DIAGNOSIS — F329 Major depressive disorder, single episode, unspecified: Secondary | ICD-10-CM | POA: Diagnosis not present

## 2017-04-03 DIAGNOSIS — F0280 Dementia in other diseases classified elsewhere without behavioral disturbance: Secondary | ICD-10-CM | POA: Diagnosis not present

## 2017-04-04 DIAGNOSIS — F0280 Dementia in other diseases classified elsewhere without behavioral disturbance: Secondary | ICD-10-CM | POA: Diagnosis not present

## 2017-04-04 DIAGNOSIS — F329 Major depressive disorder, single episode, unspecified: Secondary | ICD-10-CM | POA: Diagnosis not present

## 2017-04-04 DIAGNOSIS — I10 Essential (primary) hypertension: Secondary | ICD-10-CM | POA: Diagnosis not present

## 2017-04-04 DIAGNOSIS — Z23 Encounter for immunization: Secondary | ICD-10-CM | POA: Diagnosis not present

## 2017-04-04 DIAGNOSIS — G309 Alzheimer's disease, unspecified: Secondary | ICD-10-CM | POA: Diagnosis not present

## 2017-04-08 DIAGNOSIS — I10 Essential (primary) hypertension: Secondary | ICD-10-CM | POA: Diagnosis not present

## 2017-04-08 DIAGNOSIS — F0280 Dementia in other diseases classified elsewhere without behavioral disturbance: Secondary | ICD-10-CM | POA: Diagnosis not present

## 2017-04-08 DIAGNOSIS — G309 Alzheimer's disease, unspecified: Secondary | ICD-10-CM | POA: Diagnosis not present

## 2017-04-08 DIAGNOSIS — F329 Major depressive disorder, single episode, unspecified: Secondary | ICD-10-CM | POA: Diagnosis not present

## 2017-04-09 DIAGNOSIS — F0280 Dementia in other diseases classified elsewhere without behavioral disturbance: Secondary | ICD-10-CM | POA: Diagnosis not present

## 2017-04-09 DIAGNOSIS — I10 Essential (primary) hypertension: Secondary | ICD-10-CM | POA: Diagnosis not present

## 2017-04-09 DIAGNOSIS — F329 Major depressive disorder, single episode, unspecified: Secondary | ICD-10-CM | POA: Diagnosis not present

## 2017-04-09 DIAGNOSIS — G309 Alzheimer's disease, unspecified: Secondary | ICD-10-CM | POA: Diagnosis not present

## 2017-04-11 DIAGNOSIS — F0280 Dementia in other diseases classified elsewhere without behavioral disturbance: Secondary | ICD-10-CM | POA: Diagnosis not present

## 2017-04-11 DIAGNOSIS — F329 Major depressive disorder, single episode, unspecified: Secondary | ICD-10-CM | POA: Diagnosis not present

## 2017-04-11 DIAGNOSIS — G309 Alzheimer's disease, unspecified: Secondary | ICD-10-CM | POA: Diagnosis not present

## 2017-04-11 DIAGNOSIS — I10 Essential (primary) hypertension: Secondary | ICD-10-CM | POA: Diagnosis not present

## 2017-04-15 DIAGNOSIS — I10 Essential (primary) hypertension: Secondary | ICD-10-CM | POA: Diagnosis not present

## 2017-04-15 DIAGNOSIS — F0280 Dementia in other diseases classified elsewhere without behavioral disturbance: Secondary | ICD-10-CM | POA: Diagnosis not present

## 2017-04-15 DIAGNOSIS — G309 Alzheimer's disease, unspecified: Secondary | ICD-10-CM | POA: Diagnosis not present

## 2017-04-15 DIAGNOSIS — F329 Major depressive disorder, single episode, unspecified: Secondary | ICD-10-CM | POA: Diagnosis not present

## 2017-04-16 DIAGNOSIS — C4441 Basal cell carcinoma of skin of scalp and neck: Secondary | ICD-10-CM | POA: Diagnosis not present

## 2017-04-16 DIAGNOSIS — C44519 Basal cell carcinoma of skin of other part of trunk: Secondary | ICD-10-CM | POA: Diagnosis not present

## 2017-04-16 DIAGNOSIS — L821 Other seborrheic keratosis: Secondary | ICD-10-CM | POA: Diagnosis not present

## 2017-04-16 DIAGNOSIS — D485 Neoplasm of uncertain behavior of skin: Secondary | ICD-10-CM | POA: Diagnosis not present

## 2017-04-16 DIAGNOSIS — D0439 Carcinoma in situ of skin of other parts of face: Secondary | ICD-10-CM | POA: Diagnosis not present

## 2017-04-17 DIAGNOSIS — F329 Major depressive disorder, single episode, unspecified: Secondary | ICD-10-CM | POA: Diagnosis not present

## 2017-04-17 DIAGNOSIS — I10 Essential (primary) hypertension: Secondary | ICD-10-CM | POA: Diagnosis not present

## 2017-04-17 DIAGNOSIS — G309 Alzheimer's disease, unspecified: Secondary | ICD-10-CM | POA: Diagnosis not present

## 2017-04-17 DIAGNOSIS — F0280 Dementia in other diseases classified elsewhere without behavioral disturbance: Secondary | ICD-10-CM | POA: Diagnosis not present

## 2017-04-18 DIAGNOSIS — E559 Vitamin D deficiency, unspecified: Secondary | ICD-10-CM | POA: Diagnosis not present

## 2017-04-18 DIAGNOSIS — G309 Alzheimer's disease, unspecified: Secondary | ICD-10-CM | POA: Diagnosis not present

## 2017-04-18 DIAGNOSIS — E785 Hyperlipidemia, unspecified: Secondary | ICD-10-CM | POA: Diagnosis not present

## 2017-04-18 DIAGNOSIS — I10 Essential (primary) hypertension: Secondary | ICD-10-CM | POA: Diagnosis not present

## 2017-05-16 DIAGNOSIS — E559 Vitamin D deficiency, unspecified: Secondary | ICD-10-CM | POA: Diagnosis not present

## 2017-05-16 DIAGNOSIS — I10 Essential (primary) hypertension: Secondary | ICD-10-CM | POA: Diagnosis not present

## 2017-05-16 DIAGNOSIS — G309 Alzheimer's disease, unspecified: Secondary | ICD-10-CM | POA: Diagnosis not present

## 2017-05-16 DIAGNOSIS — F0390 Unspecified dementia without behavioral disturbance: Secondary | ICD-10-CM | POA: Diagnosis not present

## 2017-05-20 DIAGNOSIS — Z0000 Encounter for general adult medical examination without abnormal findings: Secondary | ICD-10-CM | POA: Diagnosis not present

## 2017-05-27 DIAGNOSIS — D518 Other vitamin B12 deficiency anemias: Secondary | ICD-10-CM | POA: Diagnosis not present

## 2017-05-27 DIAGNOSIS — E038 Other specified hypothyroidism: Secondary | ICD-10-CM | POA: Diagnosis not present

## 2017-05-27 DIAGNOSIS — E119 Type 2 diabetes mellitus without complications: Secondary | ICD-10-CM | POA: Diagnosis not present

## 2017-05-27 DIAGNOSIS — Z79899 Other long term (current) drug therapy: Secondary | ICD-10-CM | POA: Diagnosis not present

## 2017-05-27 DIAGNOSIS — E559 Vitamin D deficiency, unspecified: Secondary | ICD-10-CM | POA: Diagnosis not present

## 2017-05-27 DIAGNOSIS — E7849 Other hyperlipidemia: Secondary | ICD-10-CM | POA: Diagnosis not present

## 2017-06-19 DIAGNOSIS — L905 Scar conditions and fibrosis of skin: Secondary | ICD-10-CM | POA: Diagnosis not present

## 2017-06-19 DIAGNOSIS — C4441 Basal cell carcinoma of skin of scalp and neck: Secondary | ICD-10-CM | POA: Diagnosis not present

## 2017-06-20 DIAGNOSIS — I10 Essential (primary) hypertension: Secondary | ICD-10-CM | POA: Diagnosis not present

## 2017-06-20 DIAGNOSIS — G309 Alzheimer's disease, unspecified: Secondary | ICD-10-CM | POA: Diagnosis not present

## 2017-06-20 DIAGNOSIS — E559 Vitamin D deficiency, unspecified: Secondary | ICD-10-CM | POA: Diagnosis not present

## 2017-06-20 DIAGNOSIS — F0390 Unspecified dementia without behavioral disturbance: Secondary | ICD-10-CM | POA: Diagnosis not present

## 2017-07-02 DIAGNOSIS — C44519 Basal cell carcinoma of skin of other part of trunk: Secondary | ICD-10-CM | POA: Diagnosis not present

## 2017-07-18 DIAGNOSIS — G309 Alzheimer's disease, unspecified: Secondary | ICD-10-CM | POA: Diagnosis not present

## 2017-07-18 DIAGNOSIS — F0390 Unspecified dementia without behavioral disturbance: Secondary | ICD-10-CM | POA: Diagnosis not present

## 2017-07-18 DIAGNOSIS — I10 Essential (primary) hypertension: Secondary | ICD-10-CM | POA: Diagnosis not present

## 2017-07-18 DIAGNOSIS — E785 Hyperlipidemia, unspecified: Secondary | ICD-10-CM | POA: Diagnosis not present

## 2017-07-21 ENCOUNTER — Telehealth: Payer: Self-pay | Admitting: Family Medicine

## 2017-08-15 DIAGNOSIS — F419 Anxiety disorder, unspecified: Secondary | ICD-10-CM | POA: Diagnosis not present

## 2017-08-15 DIAGNOSIS — E785 Hyperlipidemia, unspecified: Secondary | ICD-10-CM | POA: Diagnosis not present

## 2017-08-15 DIAGNOSIS — G309 Alzheimer's disease, unspecified: Secondary | ICD-10-CM | POA: Diagnosis not present

## 2017-08-15 DIAGNOSIS — I10 Essential (primary) hypertension: Secondary | ICD-10-CM | POA: Diagnosis not present

## 2017-08-25 DIAGNOSIS — E119 Type 2 diabetes mellitus without complications: Secondary | ICD-10-CM | POA: Diagnosis not present

## 2017-08-25 DIAGNOSIS — E559 Vitamin D deficiency, unspecified: Secondary | ICD-10-CM | POA: Diagnosis not present

## 2017-08-25 DIAGNOSIS — E038 Other specified hypothyroidism: Secondary | ICD-10-CM | POA: Diagnosis not present

## 2017-08-25 DIAGNOSIS — D518 Other vitamin B12 deficiency anemias: Secondary | ICD-10-CM | POA: Diagnosis not present

## 2017-08-25 DIAGNOSIS — E7849 Other hyperlipidemia: Secondary | ICD-10-CM | POA: Diagnosis not present

## 2017-08-25 DIAGNOSIS — Z79899 Other long term (current) drug therapy: Secondary | ICD-10-CM | POA: Diagnosis not present

## 2017-09-01 DIAGNOSIS — I10 Essential (primary) hypertension: Secondary | ICD-10-CM | POA: Diagnosis not present

## 2017-09-01 DIAGNOSIS — G309 Alzheimer's disease, unspecified: Secondary | ICD-10-CM | POA: Diagnosis not present

## 2017-09-01 DIAGNOSIS — F0281 Dementia in other diseases classified elsewhere with behavioral disturbance: Secondary | ICD-10-CM | POA: Diagnosis not present

## 2017-09-01 DIAGNOSIS — F330 Major depressive disorder, recurrent, mild: Secondary | ICD-10-CM | POA: Diagnosis not present

## 2017-09-12 DIAGNOSIS — E785 Hyperlipidemia, unspecified: Secondary | ICD-10-CM | POA: Diagnosis not present

## 2017-09-12 DIAGNOSIS — I10 Essential (primary) hypertension: Secondary | ICD-10-CM | POA: Diagnosis not present

## 2017-09-12 DIAGNOSIS — G309 Alzheimer's disease, unspecified: Secondary | ICD-10-CM | POA: Diagnosis not present

## 2017-09-12 DIAGNOSIS — E559 Vitamin D deficiency, unspecified: Secondary | ICD-10-CM | POA: Diagnosis not present

## 2017-09-18 DIAGNOSIS — F330 Major depressive disorder, recurrent, mild: Secondary | ICD-10-CM | POA: Diagnosis not present

## 2017-09-18 DIAGNOSIS — I10 Essential (primary) hypertension: Secondary | ICD-10-CM | POA: Diagnosis not present

## 2017-09-18 DIAGNOSIS — F0281 Dementia in other diseases classified elsewhere with behavioral disturbance: Secondary | ICD-10-CM | POA: Diagnosis not present

## 2017-09-18 DIAGNOSIS — G309 Alzheimer's disease, unspecified: Secondary | ICD-10-CM | POA: Diagnosis not present

## 2017-09-19 ENCOUNTER — Telehealth: Payer: Self-pay

## 2017-09-19 NOTE — Telephone Encounter (Signed)
Called pt to set up AWV and there was no answer and no VM set up. Will try again later.  -MM

## 2017-09-23 NOTE — Telephone Encounter (Signed)
Per Jiles Garter pt is no longer a pt here. Pt is receiving care at [facility]. Will remove from AWV call list.  -MM

## 2017-09-25 DIAGNOSIS — G309 Alzheimer's disease, unspecified: Secondary | ICD-10-CM | POA: Diagnosis not present

## 2017-09-25 DIAGNOSIS — F330 Major depressive disorder, recurrent, mild: Secondary | ICD-10-CM | POA: Diagnosis not present

## 2017-09-25 DIAGNOSIS — F0281 Dementia in other diseases classified elsewhere with behavioral disturbance: Secondary | ICD-10-CM | POA: Diagnosis not present

## 2017-09-25 DIAGNOSIS — I10 Essential (primary) hypertension: Secondary | ICD-10-CM | POA: Diagnosis not present

## 2017-09-25 NOTE — Telephone Encounter (Signed)
complete

## 2017-10-01 DIAGNOSIS — I10 Essential (primary) hypertension: Secondary | ICD-10-CM | POA: Diagnosis not present

## 2017-10-01 DIAGNOSIS — F330 Major depressive disorder, recurrent, mild: Secondary | ICD-10-CM | POA: Diagnosis not present

## 2017-10-01 DIAGNOSIS — F0281 Dementia in other diseases classified elsewhere with behavioral disturbance: Secondary | ICD-10-CM | POA: Diagnosis not present

## 2017-10-01 DIAGNOSIS — G309 Alzheimer's disease, unspecified: Secondary | ICD-10-CM | POA: Diagnosis not present

## 2017-10-02 DIAGNOSIS — G309 Alzheimer's disease, unspecified: Secondary | ICD-10-CM | POA: Diagnosis not present

## 2017-10-02 DIAGNOSIS — I10 Essential (primary) hypertension: Secondary | ICD-10-CM | POA: Diagnosis not present

## 2017-10-02 DIAGNOSIS — F0281 Dementia in other diseases classified elsewhere with behavioral disturbance: Secondary | ICD-10-CM | POA: Diagnosis not present

## 2017-10-02 DIAGNOSIS — F330 Major depressive disorder, recurrent, mild: Secondary | ICD-10-CM | POA: Diagnosis not present

## 2017-10-09 DIAGNOSIS — I10 Essential (primary) hypertension: Secondary | ICD-10-CM | POA: Diagnosis not present

## 2017-10-09 DIAGNOSIS — F330 Major depressive disorder, recurrent, mild: Secondary | ICD-10-CM | POA: Diagnosis not present

## 2017-10-09 DIAGNOSIS — F0281 Dementia in other diseases classified elsewhere with behavioral disturbance: Secondary | ICD-10-CM | POA: Diagnosis not present

## 2017-10-09 DIAGNOSIS — G309 Alzheimer's disease, unspecified: Secondary | ICD-10-CM | POA: Diagnosis not present

## 2017-10-10 DIAGNOSIS — G309 Alzheimer's disease, unspecified: Secondary | ICD-10-CM | POA: Diagnosis not present

## 2017-10-10 DIAGNOSIS — I10 Essential (primary) hypertension: Secondary | ICD-10-CM | POA: Diagnosis not present

## 2017-10-10 DIAGNOSIS — E785 Hyperlipidemia, unspecified: Secondary | ICD-10-CM | POA: Diagnosis not present

## 2017-10-10 DIAGNOSIS — E559 Vitamin D deficiency, unspecified: Secondary | ICD-10-CM | POA: Diagnosis not present

## 2017-10-16 DIAGNOSIS — D2271 Melanocytic nevi of right lower limb, including hip: Secondary | ICD-10-CM | POA: Diagnosis not present

## 2017-10-16 DIAGNOSIS — I10 Essential (primary) hypertension: Secondary | ICD-10-CM | POA: Diagnosis not present

## 2017-10-16 DIAGNOSIS — F0281 Dementia in other diseases classified elsewhere with behavioral disturbance: Secondary | ICD-10-CM | POA: Diagnosis not present

## 2017-10-16 DIAGNOSIS — D485 Neoplasm of uncertain behavior of skin: Secondary | ICD-10-CM | POA: Diagnosis not present

## 2017-10-16 DIAGNOSIS — C44612 Basal cell carcinoma of skin of right upper limb, including shoulder: Secondary | ICD-10-CM | POA: Diagnosis not present

## 2017-10-16 DIAGNOSIS — F330 Major depressive disorder, recurrent, mild: Secondary | ICD-10-CM | POA: Diagnosis not present

## 2017-10-16 DIAGNOSIS — G309 Alzheimer's disease, unspecified: Secondary | ICD-10-CM | POA: Diagnosis not present

## 2017-10-16 DIAGNOSIS — D225 Melanocytic nevi of trunk: Secondary | ICD-10-CM | POA: Diagnosis not present

## 2017-10-16 DIAGNOSIS — Z85828 Personal history of other malignant neoplasm of skin: Secondary | ICD-10-CM | POA: Diagnosis not present

## 2017-10-16 DIAGNOSIS — D2261 Melanocytic nevi of right upper limb, including shoulder: Secondary | ICD-10-CM | POA: Diagnosis not present

## 2017-10-16 DIAGNOSIS — D2262 Melanocytic nevi of left upper limb, including shoulder: Secondary | ICD-10-CM | POA: Diagnosis not present

## 2017-10-16 DIAGNOSIS — D2272 Melanocytic nevi of left lower limb, including hip: Secondary | ICD-10-CM | POA: Diagnosis not present

## 2017-10-16 DIAGNOSIS — L821 Other seborrheic keratosis: Secondary | ICD-10-CM | POA: Diagnosis not present

## 2017-10-16 DIAGNOSIS — Z08 Encounter for follow-up examination after completed treatment for malignant neoplasm: Secondary | ICD-10-CM | POA: Diagnosis not present

## 2017-10-30 DIAGNOSIS — C44612 Basal cell carcinoma of skin of right upper limb, including shoulder: Secondary | ICD-10-CM | POA: Diagnosis not present

## 2017-10-31 DIAGNOSIS — L905 Scar conditions and fibrosis of skin: Secondary | ICD-10-CM | POA: Diagnosis not present

## 2017-10-31 DIAGNOSIS — C44612 Basal cell carcinoma of skin of right upper limb, including shoulder: Secondary | ICD-10-CM | POA: Diagnosis not present

## 2017-11-04 ENCOUNTER — Telehealth: Payer: Self-pay | Admitting: Family Medicine

## 2017-11-04 NOTE — Telephone Encounter (Signed)
No answer, no vmail 

## 2017-11-05 ENCOUNTER — Telehealth: Payer: Self-pay | Admitting: Family Medicine

## 2017-11-05 NOTE — Telephone Encounter (Signed)
No answer, unable to leave a vmsg

## 2017-11-07 DIAGNOSIS — E559 Vitamin D deficiency, unspecified: Secondary | ICD-10-CM | POA: Diagnosis not present

## 2017-11-07 DIAGNOSIS — E785 Hyperlipidemia, unspecified: Secondary | ICD-10-CM | POA: Diagnosis not present

## 2017-11-07 DIAGNOSIS — G309 Alzheimer's disease, unspecified: Secondary | ICD-10-CM | POA: Diagnosis not present

## 2017-11-07 DIAGNOSIS — I10 Essential (primary) hypertension: Secondary | ICD-10-CM | POA: Diagnosis not present

## 2017-11-24 DIAGNOSIS — E038 Other specified hypothyroidism: Secondary | ICD-10-CM | POA: Diagnosis not present

## 2017-11-24 DIAGNOSIS — D518 Other vitamin B12 deficiency anemias: Secondary | ICD-10-CM | POA: Diagnosis not present

## 2017-11-24 DIAGNOSIS — Z79899 Other long term (current) drug therapy: Secondary | ICD-10-CM | POA: Diagnosis not present

## 2017-11-24 DIAGNOSIS — E119 Type 2 diabetes mellitus without complications: Secondary | ICD-10-CM | POA: Diagnosis not present

## 2017-11-24 DIAGNOSIS — E559 Vitamin D deficiency, unspecified: Secondary | ICD-10-CM | POA: Diagnosis not present

## 2017-11-24 DIAGNOSIS — E7849 Other hyperlipidemia: Secondary | ICD-10-CM | POA: Diagnosis not present

## 2017-11-29 DIAGNOSIS — E119 Type 2 diabetes mellitus without complications: Secondary | ICD-10-CM | POA: Diagnosis not present

## 2017-11-29 DIAGNOSIS — D518 Other vitamin B12 deficiency anemias: Secondary | ICD-10-CM | POA: Diagnosis not present

## 2017-11-29 DIAGNOSIS — E038 Other specified hypothyroidism: Secondary | ICD-10-CM | POA: Diagnosis not present

## 2017-12-05 DIAGNOSIS — E559 Vitamin D deficiency, unspecified: Secondary | ICD-10-CM | POA: Diagnosis not present

## 2017-12-05 DIAGNOSIS — G309 Alzheimer's disease, unspecified: Secondary | ICD-10-CM | POA: Diagnosis not present

## 2017-12-05 DIAGNOSIS — E785 Hyperlipidemia, unspecified: Secondary | ICD-10-CM | POA: Diagnosis not present

## 2017-12-05 DIAGNOSIS — I10 Essential (primary) hypertension: Secondary | ICD-10-CM | POA: Diagnosis not present

## 2017-12-26 DIAGNOSIS — R609 Edema, unspecified: Secondary | ICD-10-CM | POA: Diagnosis not present

## 2017-12-26 DIAGNOSIS — I10 Essential (primary) hypertension: Secondary | ICD-10-CM | POA: Diagnosis not present

## 2018-01-02 DIAGNOSIS — G309 Alzheimer's disease, unspecified: Secondary | ICD-10-CM | POA: Diagnosis not present

## 2018-01-02 DIAGNOSIS — E785 Hyperlipidemia, unspecified: Secondary | ICD-10-CM | POA: Diagnosis not present

## 2018-01-02 DIAGNOSIS — E559 Vitamin D deficiency, unspecified: Secondary | ICD-10-CM | POA: Diagnosis not present

## 2018-01-02 DIAGNOSIS — I10 Essential (primary) hypertension: Secondary | ICD-10-CM | POA: Diagnosis not present

## 2018-01-30 DIAGNOSIS — G309 Alzheimer's disease, unspecified: Secondary | ICD-10-CM | POA: Diagnosis not present

## 2018-01-30 DIAGNOSIS — E785 Hyperlipidemia, unspecified: Secondary | ICD-10-CM | POA: Diagnosis not present

## 2018-01-30 DIAGNOSIS — I10 Essential (primary) hypertension: Secondary | ICD-10-CM | POA: Diagnosis not present

## 2018-01-30 DIAGNOSIS — E559 Vitamin D deficiency, unspecified: Secondary | ICD-10-CM | POA: Diagnosis not present

## 2018-01-31 DIAGNOSIS — E119 Type 2 diabetes mellitus without complications: Secondary | ICD-10-CM | POA: Diagnosis not present

## 2018-01-31 DIAGNOSIS — E038 Other specified hypothyroidism: Secondary | ICD-10-CM | POA: Diagnosis not present

## 2018-01-31 DIAGNOSIS — D518 Other vitamin B12 deficiency anemias: Secondary | ICD-10-CM | POA: Diagnosis not present

## 2018-02-05 DIAGNOSIS — D2261 Melanocytic nevi of right upper limb, including shoulder: Secondary | ICD-10-CM | POA: Diagnosis not present

## 2018-02-05 DIAGNOSIS — L853 Xerosis cutis: Secondary | ICD-10-CM | POA: Diagnosis not present

## 2018-02-05 DIAGNOSIS — D2271 Melanocytic nevi of right lower limb, including hip: Secondary | ICD-10-CM | POA: Diagnosis not present

## 2018-02-05 DIAGNOSIS — Z08 Encounter for follow-up examination after completed treatment for malignant neoplasm: Secondary | ICD-10-CM | POA: Diagnosis not present

## 2018-02-05 DIAGNOSIS — L821 Other seborrheic keratosis: Secondary | ICD-10-CM | POA: Diagnosis not present

## 2018-02-05 DIAGNOSIS — D2272 Melanocytic nevi of left lower limb, including hip: Secondary | ICD-10-CM | POA: Diagnosis not present

## 2018-02-05 DIAGNOSIS — D225 Melanocytic nevi of trunk: Secondary | ICD-10-CM | POA: Diagnosis not present

## 2018-02-05 DIAGNOSIS — D2262 Melanocytic nevi of left upper limb, including shoulder: Secondary | ICD-10-CM | POA: Diagnosis not present

## 2018-02-05 DIAGNOSIS — Z85828 Personal history of other malignant neoplasm of skin: Secondary | ICD-10-CM | POA: Diagnosis not present

## 2018-03-04 DIAGNOSIS — E559 Vitamin D deficiency, unspecified: Secondary | ICD-10-CM | POA: Diagnosis not present

## 2018-03-04 DIAGNOSIS — E119 Type 2 diabetes mellitus without complications: Secondary | ICD-10-CM | POA: Diagnosis not present

## 2018-03-04 DIAGNOSIS — Z79899 Other long term (current) drug therapy: Secondary | ICD-10-CM | POA: Diagnosis not present

## 2018-03-04 DIAGNOSIS — E7849 Other hyperlipidemia: Secondary | ICD-10-CM | POA: Diagnosis not present

## 2018-03-04 DIAGNOSIS — D518 Other vitamin B12 deficiency anemias: Secondary | ICD-10-CM | POA: Diagnosis not present

## 2018-03-04 DIAGNOSIS — E038 Other specified hypothyroidism: Secondary | ICD-10-CM | POA: Diagnosis not present

## 2018-03-05 DIAGNOSIS — E038 Other specified hypothyroidism: Secondary | ICD-10-CM | POA: Diagnosis not present

## 2018-03-05 DIAGNOSIS — D518 Other vitamin B12 deficiency anemias: Secondary | ICD-10-CM | POA: Diagnosis not present

## 2018-03-05 DIAGNOSIS — E119 Type 2 diabetes mellitus without complications: Secondary | ICD-10-CM | POA: Diagnosis not present

## 2018-03-06 DIAGNOSIS — G309 Alzheimer's disease, unspecified: Secondary | ICD-10-CM | POA: Diagnosis not present

## 2018-03-06 DIAGNOSIS — E559 Vitamin D deficiency, unspecified: Secondary | ICD-10-CM | POA: Diagnosis not present

## 2018-03-06 DIAGNOSIS — E785 Hyperlipidemia, unspecified: Secondary | ICD-10-CM | POA: Diagnosis not present

## 2018-03-06 DIAGNOSIS — I10 Essential (primary) hypertension: Secondary | ICD-10-CM | POA: Diagnosis not present

## 2018-04-02 DIAGNOSIS — E559 Vitamin D deficiency, unspecified: Secondary | ICD-10-CM | POA: Diagnosis not present

## 2018-04-02 DIAGNOSIS — G309 Alzheimer's disease, unspecified: Secondary | ICD-10-CM | POA: Diagnosis not present

## 2018-04-02 DIAGNOSIS — I10 Essential (primary) hypertension: Secondary | ICD-10-CM | POA: Diagnosis not present

## 2018-04-02 DIAGNOSIS — E785 Hyperlipidemia, unspecified: Secondary | ICD-10-CM | POA: Diagnosis not present

## 2018-04-16 DIAGNOSIS — E038 Other specified hypothyroidism: Secondary | ICD-10-CM | POA: Diagnosis not present

## 2018-04-16 DIAGNOSIS — D518 Other vitamin B12 deficiency anemias: Secondary | ICD-10-CM | POA: Diagnosis not present

## 2018-04-16 DIAGNOSIS — E119 Type 2 diabetes mellitus without complications: Secondary | ICD-10-CM | POA: Diagnosis not present

## 2018-04-17 DIAGNOSIS — Z23 Encounter for immunization: Secondary | ICD-10-CM | POA: Diagnosis not present

## 2018-05-01 DIAGNOSIS — I10 Essential (primary) hypertension: Secondary | ICD-10-CM | POA: Diagnosis not present

## 2018-05-01 DIAGNOSIS — E785 Hyperlipidemia, unspecified: Secondary | ICD-10-CM | POA: Diagnosis not present

## 2018-05-01 DIAGNOSIS — R609 Edema, unspecified: Secondary | ICD-10-CM | POA: Diagnosis not present

## 2018-05-01 DIAGNOSIS — G309 Alzheimer's disease, unspecified: Secondary | ICD-10-CM | POA: Diagnosis not present

## 2018-05-05 DIAGNOSIS — D518 Other vitamin B12 deficiency anemias: Secondary | ICD-10-CM | POA: Diagnosis not present

## 2018-05-05 DIAGNOSIS — E038 Other specified hypothyroidism: Secondary | ICD-10-CM | POA: Diagnosis not present

## 2018-05-05 DIAGNOSIS — E119 Type 2 diabetes mellitus without complications: Secondary | ICD-10-CM | POA: Diagnosis not present

## 2018-05-29 DIAGNOSIS — I10 Essential (primary) hypertension: Secondary | ICD-10-CM | POA: Diagnosis not present

## 2018-05-29 DIAGNOSIS — R609 Edema, unspecified: Secondary | ICD-10-CM | POA: Diagnosis not present

## 2018-05-29 DIAGNOSIS — E785 Hyperlipidemia, unspecified: Secondary | ICD-10-CM | POA: Diagnosis not present

## 2018-05-29 DIAGNOSIS — G309 Alzheimer's disease, unspecified: Secondary | ICD-10-CM | POA: Diagnosis not present

## 2018-06-05 DIAGNOSIS — E119 Type 2 diabetes mellitus without complications: Secondary | ICD-10-CM | POA: Diagnosis not present

## 2018-06-05 DIAGNOSIS — E038 Other specified hypothyroidism: Secondary | ICD-10-CM | POA: Diagnosis not present

## 2018-06-05 DIAGNOSIS — D518 Other vitamin B12 deficiency anemias: Secondary | ICD-10-CM | POA: Diagnosis not present

## 2019-12-11 ENCOUNTER — Other Ambulatory Visit: Payer: Self-pay

## 2019-12-11 ENCOUNTER — Emergency Department
Admission: EM | Admit: 2019-12-11 | Discharge: 2019-12-11 | Disposition: A | Discharge status: Home / Self Care | Payer: Medicare Other | Attending: Emergency Medicine | Admitting: Emergency Medicine

## 2019-12-11 ENCOUNTER — Emergency Department: Payer: Medicare Other

## 2019-12-11 DIAGNOSIS — Y999 Unspecified external cause status: Secondary | ICD-10-CM | POA: Diagnosis not present

## 2019-12-11 DIAGNOSIS — S0282XA Fracture of other specified skull and facial bones, left side, initial encounter for closed fracture: Secondary | ICD-10-CM | POA: Diagnosis not present

## 2019-12-11 DIAGNOSIS — S0532XA Ocular laceration without prolapse or loss of intraocular tissue, left eye, initial encounter: Secondary | ICD-10-CM

## 2019-12-11 DIAGNOSIS — W06XXXA Fall from bed, initial encounter: Secondary | ICD-10-CM | POA: Insufficient documentation

## 2019-12-11 DIAGNOSIS — Y929 Unspecified place or not applicable: Secondary | ICD-10-CM | POA: Diagnosis not present

## 2019-12-11 DIAGNOSIS — I10 Essential (primary) hypertension: Secondary | ICD-10-CM | POA: Insufficient documentation

## 2019-12-11 DIAGNOSIS — S0990XA Unspecified injury of head, initial encounter: Secondary | ICD-10-CM | POA: Diagnosis present

## 2019-12-11 DIAGNOSIS — R739 Hyperglycemia, unspecified: Secondary | ICD-10-CM

## 2019-12-11 DIAGNOSIS — F0390 Unspecified dementia without behavioral disturbance: Secondary | ICD-10-CM | POA: Insufficient documentation

## 2019-12-11 DIAGNOSIS — S0592XA Unspecified injury of left eye and orbit, initial encounter: Secondary | ICD-10-CM | POA: Insufficient documentation

## 2019-12-11 DIAGNOSIS — Z79899 Other long term (current) drug therapy: Secondary | ICD-10-CM | POA: Insufficient documentation

## 2019-12-11 DIAGNOSIS — W19XXXA Unspecified fall, initial encounter: Secondary | ICD-10-CM

## 2019-12-11 DIAGNOSIS — H1132 Conjunctival hemorrhage, left eye: Secondary | ICD-10-CM | POA: Insufficient documentation

## 2019-12-11 DIAGNOSIS — S0285XA Fracture of orbit, unspecified, initial encounter for closed fracture: Secondary | ICD-10-CM

## 2019-12-11 DIAGNOSIS — Y9389 Activity, other specified: Secondary | ICD-10-CM | POA: Insufficient documentation

## 2019-12-11 LAB — BASIC METABOLIC PANEL
Anion gap: 9 (ref 5–15)
BUN: 23 mg/dL (ref 8–23)
CO2: 27 mmol/L (ref 22–32)
Calcium: 9.6 mg/dL (ref 8.9–10.3)
Chloride: 104 mmol/L (ref 98–111)
Creatinine, Ser: 1.11 mg/dL — ABNORMAL HIGH (ref 0.44–1.00)
GFR calc Af Amer: 54 mL/min — ABNORMAL LOW (ref >60)
GFR calc non Af Amer: 47 mL/min — ABNORMAL LOW (ref >60)
Glucose, Bld: 168 mg/dL — ABNORMAL HIGH (ref 70–99)
Potassium: 3.3 mmol/L — ABNORMAL LOW (ref 3.5–5.1)
Sodium: 140 mmol/L (ref 135–145)

## 2019-12-11 LAB — CBC WITH DIFFERENTIAL/PLATELET
Abs Immature Granulocytes: 0.02 10*3/uL (ref 0.00–0.07)
Basophils Absolute: 0.1 10*3/uL (ref 0.0–0.1)
Basophils Relative: 1 %
Eosinophils Absolute: 0.5 10*3/uL (ref 0.0–0.5)
Eosinophils Relative: 6 %
HCT: 35.1 % — ABNORMAL LOW (ref 36.0–46.0)
Hemoglobin: 11.9 g/dL — ABNORMAL LOW (ref 12.0–15.0)
Immature Granulocytes: 0 %
Lymphocytes Relative: 19 %
Lymphs Abs: 1.5 10*3/uL (ref 0.7–4.0)
MCH: 28.3 pg (ref 26.0–34.0)
MCHC: 33.9 g/dL (ref 30.0–36.0)
MCV: 83.4 fL (ref 80.0–100.0)
Monocytes Absolute: 0.9 10*3/uL (ref 0.1–1.0)
Monocytes Relative: 12 %
Neutro Abs: 4.6 10*3/uL (ref 1.7–7.7)
Neutrophils Relative %: 62 %
Platelets: 170 10*3/uL (ref 150–400)
RBC: 4.21 MIL/uL (ref 3.87–5.11)
RDW: 16.8 % — ABNORMAL HIGH (ref 11.5–15.5)
WBC: 7.6 10*3/uL (ref 4.0–10.5)
nRBC: 0 % (ref 0.0–0.2)

## 2019-12-11 MED ORDER — ACETAMINOPHEN 500 MG PO TABS
1000.0000 mg | ORAL_TABLET | Freq: Once | ORAL | Status: AC
  Administered 2019-12-11: 1000 mg via ORAL
  Filled 2019-12-11: qty 2

## 2019-12-11 MED ORDER — ONDANSETRON HCL 4 MG/2ML IJ SOLN
4.0000 mg | Freq: Once | INTRAMUSCULAR | Status: AC
  Administered 2019-12-11: 4 mg via INTRAVENOUS
  Filled 2019-12-11: qty 2

## 2019-12-11 MED ORDER — AZITHROMYCIN 250 MG PO TABS
ORAL_TABLET | ORAL | 0 refills | Status: AC
Start: 2019-12-11 — End: 2019-12-16

## 2019-12-11 MED ORDER — FENTANYL CITRATE (PF) 100 MCG/2ML IJ SOLN
25.0000 ug | Freq: Once | INTRAMUSCULAR | Status: AC
  Administered 2019-12-11: 25 ug via INTRAVENOUS
  Filled 2019-12-11: qty 2

## 2019-12-11 NOTE — ED Notes (Signed)
Opthalmology MD at patient bedside.

## 2019-12-11 NOTE — ED Provider Notes (Signed)
Patient seen by Dr. Edison Pace who recommends d/c with outpatient follow up and prophylactic abx   Lavonia Drafts, MD 12/11/19 [phone]

## 2019-12-11 NOTE — ED Triage Notes (Signed)
Pt to ED via EMS from [facility]. EMS reports pt fell OOB. P c/o pain in L shoulder, wrist and head. Pt's L eye swollen, pt has bruising on L arm. Pt has hx of [place]. EMS VS [facility].

## 2019-12-11 NOTE — Consult Note (Signed)
Reason for Consult: eye trauma Referring Physician: Dr. Alfred Levins. Chief complaint: patient: "everything is good.  I can see fine.  I'm not having any trouble with it"  HPI: Pam Keller is an 79 y.o. 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  She had a mechanical fall getting out of bed and sought care at the ED.  History is provided by her daughter.  Patient's history is unreliable.     Past Medical History:  Diagnosis Date  . Basal cell carcinoma   . Hyperlipidemia   . Hypertension   . Microscopic hematuria 2010   negative workuo  . Pulmonary nodule     ROS  Past Surgical History:  Procedure Laterality Date  . ABDOMINAL HYSTERECTOMY  1984   menorrhagia  . APPENDECTOMY  1955   for rupture appx  . BREAST BIOPSY     benign  . CARPAL TUNNEL RELEASE    . FOOT SURGERY    . macular surgery     [facility], repaired  . MOHS SURGERY  2012   for basal cell, forehead  . OOPHORECTOMY  1960  . TRIGGER FINGER RELEASE  2004   [facility]    Family History  Problem Relation Age of Onset  . Hypertension Father   . Cancer Neg Hx     Social History:  reports that she quit smoking about 12 years ago. Her smoking use included cigarettes. She has a 24.00 pack-year smoking history. She has never used smokeless tobacco. She reports current alcohol use. She reports that she does not use drugs.  Allergies:  Allergies  Allergen Reactions  . Meperidine Other (See Comments)    Other Reaction: GI Upset  . Clindamycin/Lincomycin   . Codeine   . Demerol   . Penicillins   . Sulfa Antibiotics     Prior to Admission medications   Medication Sig Start Date End Date Taking? Authorizing Provider  amLODipine (NORVASC) 5 MG tablet TAKE 1 TABLET (5 MG TOTAL) BY MOUTH DAILY. 08/14/15   Crecencio Mc, MD  aspirin 325 MG tablet Take 325 mg by mouth daily.      [provider]  donepezil (ARICEPT) 5 MG tablet  TAKE 1 TABLET (5 MG TOTAL) BY MOUTH AT BEDTIME. 05/27/16   Jerrol Banana., MD  fluticasone [facility]) 50 MCG/ACT nasal spray Place 2 sprays into both nostrils daily. PRN 03/27/16   Jerrol Banana., MD  losartan-hydrochlorothiazide (HYZAAR) 100-25 MG tablet Take 1 tablet by mouth daily. 01/23/16   Jerrol Banana., MD  sertraline (ZOLOFT) 50 MG tablet Take 50 mg by mouth daily.    [provider]    Results for orders placed or performed during the hospital encounter of 12/11/19 (from the past 48 hour(s))  CBC with Differential/Platelet     Status: Abnormal   Collection Time: 12/11/19  4:37 AM  Result Value Ref Range   WBC 7.6 4.0 - 10.5 K/uL   RBC 4.21 3.87 - 5.11 MIL/uL   Hemoglobin 11.9 (L) 12.0 - 15.0 g/dL   HCT 35.1 (L) 36 - 46 %   MCV 83.4 80.0 - 100.0 fL   MCH 28.3 26.0 - 34.0 pg   MCHC 33.9 30.0 - 36.0 g/dL   RDW 16.8 (H) 11.5 - 15.5 %   Platelets 170 150 - 400 K/uL   nRBC 0.0 0.0 - 0.2 %   Neutrophils Relative %  62 %   Neutro Abs 4.6 1.7 - 7.7 K/uL   Lymphocytes Relative 19 %   Lymphs Abs 1.5 0.7 - 4.0 K/uL   Monocytes Relative 12 %   Monocytes Absolute 0.9 0 - 1 K/uL   Eosinophils Relative 6 %   Eosinophils Absolute 0.5 0 - 0 K/uL   Basophils Relative 1 %   Basophils Absolute 0.1 0 - 0 K/uL   Immature Granulocytes 0 %   Abs Immature Granulocytes 0.02 0.00 - 0.07 K/uL    Comment: Performed at [facility] [address], [place], [place] [address]  Basic metabolic panel     Status: Abnormal   Collection Time: 12/11/19  4:37 AM  Result Value Ref Range   Sodium 140 135 - 145 mmol/L   Potassium 3.3 (L) 3.5 - 5.1 mmol/L   Chloride 104 98 - 111 mmol/L   CO2 27 22 - 32 mmol/L   Glucose, Bld 168 (H) 70 - 99 mg/dL    Comment: Glucose reference range applies only to samples taken after fasting for at least 8 hours.   BUN 23 8 - 23 mg/dL   Creatinine, Ser 1.11 (H) 0.44 - 1.00 mg/dL   Calcium 9.6 8.9 - 10.3 mg/dL   GFR calc non Af Amer 47  (L) >60 mL/min   GFR calc Af Amer 54 (L) >60 mL/min   Anion gap 9 5 - 15    Comment: Performed at [facility], [address], [place], [place] [address]    CT Head Wo Contrast  Result Date: 12/11/2019 CLINICAL DATA:  Fall.  Small LEFT eye. EXAM: CT HEAD WITHOUT CONTRAST CT MAXILLOFACIAL WITHOUT CONTRAST TECHNIQUE: Multidetector CT imaging of the head and maxillofacial structures were performed using the standard protocol without intravenous contrast. Multiplanar CT image reconstructions of the maxillofacial structures were also generated. COMPARISON:  None. FINDINGS: CT HEAD FINDINGS Brain: No acute intracranial hemorrhage. No focal mass lesion. No CT evidence of acute infarction. No midline shift or mass effect. No hydrocephalus. Basilar cisterns are patent. There are periventricular and subcortical white matter hypodensities. Generalized cortical atrophy. Vascular: No hyperdense vessel or unexpected calcification. Skull: Normal. Negative for fracture or focal lesion. Sinuses/Orbits: See facial section Other: None. CT MAXILLOFACIAL FINDINGS Osseous: Fracture of the floor of the LEFT orbit. There is herniated fat through the fracture defect. The inferior rectus muscle enters the fracture defect (image 275/8). No additional facial bone fracture Orbits: LEFT preseptal gas and gas within the LEFT orbit. Gas dissects along the extraocular muscles. The LEFT inferior rectus muscle dips into the orbital wall fracture defect. Fat from the LEFT orbit herniates through the inferior orbital wall fracture. There is high-density fluid within the LEFT maxillary sinus. Sinuses: Fluid within LEFT maxillary sinus is high-density. Soft tissues: As above IMPRESSION: 1. Fracture of the LEFT inferior orbital wall. 2. Intraconal fat herniates through the inferior orbital fracture. 3. The LEFT inferior rectus muscle dips into the fracture defect in the inferior wall. Recommend clinical correlation for entrapment. 4.  Emphysema within the LEFT orbit. 5. Smaller blood within the LEFT maxillary sinus. 6. No intracranial trauma. 7. Generalized atrophy. Electronically Signed   By: Suzy Bouchard M.D.   On: 12/11/2019 05:54   CT Maxillofacial Wo Contrast  Result Date: 12/11/2019 CLINICAL DATA:  Fall.  Small LEFT eye. EXAM: CT HEAD WITHOUT CONTRAST CT MAXILLOFACIAL WITHOUT CONTRAST TECHNIQUE: Multidetector CT imaging of the head and maxillofacial structures were performed using the standard protocol without intravenous contrast.  Multiplanar CT image reconstructions of the maxillofacial structures were also generated. COMPARISON:  None. FINDINGS: CT HEAD FINDINGS Brain: No acute intracranial hemorrhage. No focal mass lesion. No CT evidence of acute infarction. No midline shift or mass effect. No hydrocephalus. Basilar cisterns are patent. There are periventricular and subcortical white matter hypodensities. Generalized cortical atrophy. Vascular: No hyperdense vessel or unexpected calcification. Skull: Normal. Negative for fracture or focal lesion. Sinuses/Orbits: See facial section Other: None. CT MAXILLOFACIAL FINDINGS Osseous: Fracture of the floor of the LEFT orbit. There is herniated fat through the fracture defect. The inferior rectus muscle enters the fracture defect (image 275/8). No additional facial bone fracture Orbits: LEFT preseptal gas and gas within the LEFT orbit. Gas dissects along the extraocular muscles. The LEFT inferior rectus muscle dips into the orbital wall fracture defect. Fat from the LEFT orbit herniates through the inferior orbital wall fracture. There is high-density fluid within the LEFT maxillary sinus. Sinuses: Fluid within LEFT maxillary sinus is high-density. Soft tissues: As above IMPRESSION: 1. Fracture of the LEFT inferior orbital wall. 2. Intraconal fat herniates through the inferior orbital fracture. 3. The LEFT inferior rectus muscle dips into the fracture defect in the inferior wall.  Recommend clinical correlation for entrapment. 4. Emphysema within the LEFT orbit. 5. Smaller blood within the LEFT maxillary sinus. 6. No intracranial trauma. 7. Generalized atrophy. Electronically Signed   By: Suzy Bouchard M.D.   On: 12/11/2019 05:54    Blood pressure 119/67, pulse 62, temperature 98.7 F (37.1 C), temperature source Oral, resp. rate 14, height 5' 3.5" (1.613 m), weight 62 kg, SpO2 96 %.   Examined with slit lamp and 20D indirect in eye room at ED with daughter.  Mental status: Alert, disoriented, demented,   Visual Acuity:  20/50 OD  20/50 near [place]  Pupils:  Equally round/ reactive to light., miotic OU.  No Afferent defect.  Motility:  Full/ orthophoric.  Mild decrease in supraduction OS.    Visual Fields:  Poorly cooperative.  IOP:  Soft to palpation.  External/ Lids/ Lashes:  2+edema, erythema left periorbit.  Able to lift eyelids to examine eyes without difficulty, some limited cooperation.  Anterior Segment:  Conjunctiva:  Subconjunctival hemorrhage 7:00 - 11:00 OS.    Cornea:  Normal  OU  Anterior Chamber: Normal  OU  Lens:   PCIOL OU  Posterior Segment: Dilated OS only with 1% Tropicamide and 2.5% Phenylephrine  Discs:   Normal c/d ratio, no pallor, no edema OU  Macula:  flat  Vessels/ Periphery: Glimpses of periphery, limited view of due to poor tolerance by patient    Assessment/Plan:  1.  Trauma to left orbit related to fall, inferior floor blow out fracture with partial herniation of inferior rectus, but no significant restriction of extraocular movements or symptomatic diplopia.    2.  Subconjunctical hemorrhage left eye, but no significant injury to globe.  Observe, allow to heal.      Plan: 1 week course of oral antibiotics such as augmentin, or zpak for prophylaxis.  Reassurance, observation.  Ice packs 20 min on, 20 min off today.    Follow up in outpatient ophthalmology clinic within 2 weeks.  Benay Pillow 12/11/2019, 8:22 AM

## 2019-12-11 NOTE — Discharge Instructions (Addendum)
Follow-up with the ophthalmologist Dr. Edison Pace per his recommendation.  Follow-up with Dr. Tami Ribas for the fracture of your orbit in a week.  Follow-up with your doctor within a week for reevaluation of fasting glucose.  Return to the emergency room if you have changes in your vision, worsening eye pain, headache, or difficulty moving her eye.

## 2019-12-11 NOTE — ED Provider Notes (Signed)
[facility] Emergency Department Provider Note  ____________________________________________  Time seen: Approximately 4:46 AM  I have reviewed the triage vital signs and the nursing notes.   HISTORY  Chief Complaint Fall   HPI Pam Keller is a 79 y.o. female with history of dementia, HTN, HLD who presents for evaluation of fall. Patient rolled out bed this evening. She is complaining of L eye pain and HA.  She is not on blood thinners.  She is complaining of moderate sharp left eye pain and photophobia since the fall.  She is unclear if she hit her eye in any piece of furniture.  No LOC.  No neck pain or back pain, no chest pain or shortness of breath, no abdominal pain, no extremity pain.   Past Medical History:  Diagnosis Date  . Basal cell carcinoma   . Hyperlipidemia   . Hypertension   . Microscopic hematuria 2010   negative workuo  . Pulmonary nodule     Patient Active Problem List   Diagnosis Date Noted  . Memory impairment 12/17/2014  . Anxiety and depression 12/17/2014  . Long-term use of high-risk medication 10/06/2014  . Hypokalemia 11/19/2013  . Chronic insomnia 10/05/2013  . Carotid bruit present 10/04/2013  . Occipital headache 09/29/2012  . Neck stiffness 09/08/2012  . 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 Screening for colon cancer 09/29/2011  . Medicare annual wellness visit, subsequent 09/27/2011  . Hyperlipidemia   . Hypertension   . Pulmonary nodule   . Basal cell carcinoma   . Screening for breast cancer 04/29/2011  . Screening for cervical cancer 04/29/2011  . History of postmenopausal HRT 04/29/2011  . Microscopic hematuria   . 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 ABDOMINAL HYSTERECTOMY  1984   menorrhagia  . APPENDECTOMY  1955   for rupture appx  . BREAST BIOPSY     benign  . CARPAL TUNNEL RELEASE    . FOOT SURGERY    . macular surgery     [facility], repaired  . MOHS  SURGERY  2012   for basal cell, forehead  . OOPHORECTOMY  1960  . TRIGGER FINGER RELEASE  2004   [facility]    Prior to Admission medications   Medication Sig Start Date End Date Taking? Authorizing Provider  amLODipine (NORVASC) 5 MG tablet TAKE 1 TABLET (5 MG TOTAL) BY MOUTH DAILY. 08/14/15   Crecencio Mc, MD  aspirin 325 MG tablet Take 325 mg by mouth daily.      [provider]  donepezil (ARICEPT) 5 MG tablet TAKE 1 TABLET (5 MG TOTAL) BY MOUTH AT BEDTIME. 05/27/16   Jerrol Banana., MD  fluticasone [facility]) 50 MCG/ACT nasal spray Place 2 sprays into both nostrils daily. PRN 03/27/16   Jerrol Banana., MD  losartan-hydrochlorothiazide (HYZAAR) 100-25 MG tablet Take 1 tablet by mouth daily. 01/23/16   Jerrol Banana., MD  sertraline (ZOLOFT) 50 MG tablet Take 50 mg by mouth daily.    [provider]    Allergies Meperidine, Clindamycin/lincomycin, Codeine, Demerol, Penicillins, and Sulfa antibiotics  Family History  Problem Relation Age of Onset  . Hypertension Father   . Cancer Neg Hx     Social History Social History   Tobacco Use  . Smoking status: Former Smoker    Packs/day: 0.50    Years: 48.00    Pack years: 24.00    Types: Cigarettes    Quit  date: 04/29/2007    Years since quitting: 12.6  . Smokeless tobacco: Never Used  Substance Use Topics  . Alcohol use: Yes    Alcohol/week: 0.0 standard drinks    Comment: once or a twice a year.  . Drug use: No    Review of Systems  Constitutional: Negative for fever. Eyes: + L eye pain ENT: Negative for facial injury or neck injury Cardiovascular: Negative for chest injury. Respiratory: Negative for shortness of breath. Negative for chest wall injury. Gastrointestinal: Negative for abdominal pain or injury. Genitourinary: Negative for dysuria. Musculoskeletal: Negative for back injury, negative for arm or leg pain. Skin: Negative for laceration/abrasions. Neurological: + head  injury.   ____________________________________________   PHYSICAL EXAM:  VITAL SIGNS: ED Triage Vitals  Enc Vitals Group     BP 12/11/19 0435 127/72     Pulse Rate 12/11/19 0435 66     Resp 12/11/19 0435 18     Temp 12/11/19 0435 98.7 F (37.1 C)     Temp Source 12/11/19 0435 Oral     SpO2 12/11/19 0435 96 %     Weight 12/11/19 0436 136 lb 11 oz (62 kg)     Height 12/11/19 0436 5' 3.5" (1.613 m)     Head Circumference --      Peak Flow --      Pain Score --      Pain Loc --      Pain Edu? --      Excl. in [place]? --     Full spinal precautions maintained throughout the trauma exam. Constitutional: Alert and oriented. No acute distress. Does not appear intoxicated. HEENT Head: Normocephalic and atraumatic. Face: No facial bony tenderness. Stable midface Ears: No hemotympanum bilaterally. No Battle sign Eyes: L periorbital swelling with conjunctiva laceration medially, pupil reactive and round, EOMI Nose: Nontender. No epistaxis. No rhinorrhea Mouth/Throat: Mucous membranes are moist. No oropharyngeal blood. No dental injury. Airway patent without stridor. Normal voice. Neck: no C-collar. No midline c-spine tenderness.  Cardiovascular: Normal rate, regular rhythm. Normal and symmetric distal pulses are present in all extremities. Pulmonary/Chest: Chest wall is stable and nontender to palpation/compression. Normal respiratory effort. Breath sounds are normal. No crepitus.  Abdominal: Soft, nontender, non distended. Musculoskeletal: Nontender with normal full range of motion in all extremities. No deformities. No thoracic or lumbar midline spinal tenderness. Pelvis is stable. Skin: Skin is warm, dry and intact. No abrasions or contutions. Psychiatric: Speech and behavior are appropriate. Neurological: Normal speech and language. Moves all extremities to command. No gross focal neurologic deficits are appreciated.  Glascow Coma Score: 4 - Opens eyes on own 6 - Follows simple  motor commands 5 - Alert and oriented GCS: 15   ____________________________________________   LABS (all labs ordered are listed, but only abnormal results are displayed)  Labs Reviewed  CBC WITH DIFFERENTIAL/PLATELET - Abnormal; Notable for the following components:      Result Value   Hemoglobin 11.9 (*)    HCT 35.1 (*)    RDW 16.8 (*)    All other components within normal limits  BASIC METABOLIC PANEL - Abnormal; Notable for the following components:   Potassium 3.3 (*)    Glucose, Bld 168 (*)    Creatinine, Ser 1.11 (*)    GFR calc non Af Amer 47 (*)    GFR calc Af Amer 54 (*)    All other components within normal limits   ____________________________________________  EKG  ED ECG REPORT I, Rudene Re,  the attending physician, personally viewed and interpreted this ECG.  Normal sinus rhythm, rate of 67, normal intervals, normal axis, no ST elevations or depressions ____________________________________________  RADIOLOGY  I have personally reviewed the images performed during this visit and I agree with the Radiologist's read.   Interpretation by Radiologist:  CT Head Wo Contrast  Result Date: 12/11/2019 CLINICAL DATA:  Fall.  Small LEFT eye. EXAM: CT HEAD WITHOUT CONTRAST CT MAXILLOFACIAL WITHOUT CONTRAST TECHNIQUE: Multidetector CT imaging of the head and maxillofacial structures were performed using the standard protocol without intravenous contrast. Multiplanar CT image reconstructions of the maxillofacial structures were also generated. COMPARISON:  None. FINDINGS: CT HEAD FINDINGS Brain: No acute intracranial hemorrhage. No focal mass lesion. No CT evidence of acute infarction. No midline shift or mass effect. No hydrocephalus. Basilar cisterns are patent. There are periventricular and subcortical white matter hypodensities. Generalized cortical atrophy. Vascular: No hyperdense vessel or unexpected calcification. Skull: Normal. Negative for fracture or focal  lesion. Sinuses/Orbits: See facial section Other: None. CT MAXILLOFACIAL FINDINGS Osseous: Fracture of the floor of the LEFT orbit. There is herniated fat through the fracture defect. The inferior rectus muscle enters the fracture defect (image 275/8). No additional facial bone fracture Orbits: LEFT preseptal gas and gas within the LEFT orbit. Gas dissects along the extraocular muscles. The LEFT inferior rectus muscle dips into the orbital wall fracture defect. Fat from the LEFT orbit herniates through the inferior orbital wall fracture. There is high-density fluid within the LEFT maxillary sinus. Sinuses: Fluid within LEFT maxillary sinus is high-density. Soft tissues: As above IMPRESSION: 1. Fracture of the LEFT inferior orbital wall. 2. Intraconal fat herniates through the inferior orbital fracture. 3. The LEFT inferior rectus muscle dips into the fracture defect in the inferior wall. Recommend clinical correlation for entrapment. 4. Emphysema within the LEFT orbit. 5. Smaller blood within the LEFT maxillary sinus. 6. No intracranial trauma. 7. Generalized atrophy. Electronically Signed   By: Suzy Bouchard M.D.   On: 12/11/2019 05:54   CT Maxillofacial Wo Contrast  Result Date: 12/11/2019 CLINICAL DATA:  Fall.  Small LEFT eye. EXAM: CT HEAD WITHOUT CONTRAST CT MAXILLOFACIAL WITHOUT CONTRAST TECHNIQUE: Multidetector CT imaging of the head and maxillofacial structures were performed using the standard protocol without intravenous contrast. Multiplanar CT image reconstructions of the maxillofacial structures were also generated. COMPARISON:  None. FINDINGS: CT HEAD FINDINGS Brain: No acute intracranial hemorrhage. No focal mass lesion. No CT evidence of acute infarction. No midline shift or mass effect. No hydrocephalus. Basilar cisterns are patent. There are periventricular and subcortical white matter hypodensities. Generalized cortical atrophy. Vascular: No hyperdense vessel or unexpected calcification.  Skull: Normal. Negative for fracture or focal lesion. Sinuses/Orbits: See facial section Other: None. CT MAXILLOFACIAL FINDINGS Osseous: Fracture of the floor of the LEFT orbit. There is herniated fat through the fracture defect. The inferior rectus muscle enters the fracture defect (image 275/8). No additional facial bone fracture Orbits: LEFT preseptal gas and gas within the LEFT orbit. Gas dissects along the extraocular muscles. The LEFT inferior rectus muscle dips into the orbital wall fracture defect. Fat from the LEFT orbit herniates through the inferior orbital wall fracture. There is high-density fluid within the LEFT maxillary sinus. Sinuses: Fluid within LEFT maxillary sinus is high-density. Soft tissues: As above IMPRESSION: 1. Fracture of the LEFT inferior orbital wall. 2. Intraconal fat herniates through the inferior orbital fracture. 3. The LEFT inferior rectus muscle dips into the fracture defect in the inferior wall. Recommend clinical correlation for  entrapment. 4. Emphysema within the LEFT orbit. 5. Smaller blood within the LEFT maxillary sinus. 6. No intracranial trauma. 7. Generalized atrophy. Electronically Signed   By: Suzy Bouchard M.D.   On: 12/11/2019 05:54      ____________________________________________   PROCEDURES  Procedure(s) performed: yes .1-3 Lead EKG Interpretation Performed by: Rudene Re, MD Authorized by: Rudene Re, MD     Interpretation: non-specific     ECG rate assessment: normal     Rhythm: sinus rhythm     Ectopy: none     Conduction: normal     Critical Care performed:  None ____________________________________________   INITIAL IMPRESSION / ASSESSMENT AND PLAN / ED COURSE   79 y.o. female with history of dementia, HTN, HLD who presents for evaluation of fall.  Patient rolled out of bed and hit her eye she is not sure where.  She does have conjunctival laceration medially on the left eye, pupils round and reactive with intact  extraocular movements.  She also has mild periorbital swelling on the left.  No other traumatic injuries based on history or physical exam.  CT head and face pending.  Will give Tylenol for pain.  Will consult ophthalmology.  Since fall was unwitnessed and patient has a history of dementia we will check basic labs.  EKG showing no signs of ischemia or dysrhythmia.  Patient placed on telemetry for close monitoring.  Old medical records reviewed.  _________________________ 6:38 AM on 12/11/2019 -----------------------------------------  CT with no intracranial injury but did show inferior orbital wall fracture on the left, confirmed by radiology.  On exam patient has intact extraocular movements with no signs of entrapment.  Discussed with Dr. Edison Pace from ophthalmology who will come and evaluate her laceration.  Labs with mild bump on her creatinine however last lab value available for comparison is from 2017.  She does have slightly elevated glucose of 168 with no signs of DKA or no history of diabetes.  Discussed this finding with patient and her daughter who are at bedside and recommended follow-up with PCP for evaluation of possible diabetes.  We will hold initiating any medications at this time.    ____________________________________________  Please note:  Patient was evaluated in Emergency Department today for the symptoms described in the history of present illness. Patient was evaluated in the context of the global COVID-19 pandemic, which necessitated consideration that the patient might be at risk for infection with the SARS-CoV-2 virus that causes COVID-19. Institutional protocols and algorithms that pertain to the evaluation of patients at risk for COVID-19 are in a state of rapid change based on information released by regulatory bodies including the CDC and federal and state organizations. These policies and algorithms were followed during the patient's care in the ED.  Some ED evaluations and  interventions may be delayed as a result of limited staffing during the pandemic.   ____________________________________________   FINAL CLINICAL IMPRESSION(S) / ED DIAGNOSES   Final diagnoses:  Fall, initial encounter  Laceration of left conjunctiva, initial encounter  Hyperglycemia  Closed fracture of orbital wall, initial encounter ([place])      NEW MEDICATIONS STARTED DURING THIS VISIT:  ED Discharge Orders    None       Note:  This document was prepared using Dragon voice recognition software and may include unintentional dictation errors.    Alfred Levins, [place], MD 12/11/19 [phone]

## 2020-02-21 ENCOUNTER — Other Ambulatory Visit: Payer: Self-pay

## 2020-02-21 ENCOUNTER — Ambulatory Visit (INDEPENDENT_AMBULATORY_CARE_PROVIDER_SITE_OTHER): Payer: Medicare Other | Admitting: Cardiology

## 2020-02-21 ENCOUNTER — Encounter: Payer: Self-pay | Admitting: Cardiology

## 2020-02-21 VITALS — BP 162/82 | HR 53 | Ht 63.5 in | Wt 133.2 lb

## 2020-02-21 DIAGNOSIS — I5189 Other ill-defined heart diseases: Secondary | ICD-10-CM | POA: Diagnosis not present

## 2020-02-21 DIAGNOSIS — I272 Pulmonary hypertension, unspecified: Secondary | ICD-10-CM

## 2020-02-21 DIAGNOSIS — I10 Essential (primary) hypertension: Secondary | ICD-10-CM | POA: Diagnosis not present

## 2020-02-21 NOTE — Patient Instructions (Signed)

## 2020-02-21 NOTE — Progress Notes (Signed)
Cardiology Office Note:    Date:  02/21/2020   ID:  Pam Keller, DOB 07-25-1940, MRN [id number]  PCP:  Bonnita Nasuti, MD  [place] Cardiologist:  Kate Sable, MD  [place] Electrophysiologist:  None   Referring MD: Bonnita Nasuti, MD   Chief Complaint  Patient presents with  . New Patient (Initial Visit)    New patient to establish due to abnormal echo.Medications verbally reviewed with patient.     History of Present Illness:    History obtained from daughter in room due to condition of patient.  Pam Keller is a 79 y.o. female with a hx of hypertension, hyperlipidemia, dementia who presents due to an abnormal echocardiogram.  Patient lives in an assisted living facility.  Primary physician typically visits the facility.  Edema was noted on exam and echocardiogram performed.  Echo report on 02/11/2020 showed normal systolic function, EF 21%, grade 1 diastolic dysfunction, without elevated LA pressure, moderate pulmonary hypertension, RVSP 45.  Patient not able to answer questions due to condition.  No history of heart disease.  Past Medical History:  Diagnosis Date  . Basal cell carcinoma   . Hyperlipidemia   . Hypertension   . Microscopic hematuria 2010   negative workuo  . Pulmonary nodule     Past Surgical History:  Procedure Laterality Date  . ABDOMINAL HYSTERECTOMY  1984   menorrhagia  . APPENDECTOMY  1955   for rupture appx  . BREAST BIOPSY     benign  . CARPAL TUNNEL RELEASE    . FOOT SURGERY    . macular surgery     [facility], repaired  . MOHS SURGERY  2012   for basal cell, forehead  . OOPHORECTOMY  1960  . TRIGGER FINGER RELEASE  2004   [facility]    Current Medications: Current Meds  Medication Sig  . amLODipine (NORVASC) 10 MG tablet Take 10 mg by mouth daily.  Marland Kitchen amLODipine (NORVASC) 5 MG tablet TAKE 1 TABLET (5 MG TOTAL) BY MOUTH DAILY.  Marland Kitchen aspirin 325 MG tablet Take 325 mg by mouth daily.    Marland Kitchen atorvastatin (LIPITOR) 10 MG  tablet Take 10 mg by mouth daily.  . busPIRone (BUSPAR) 10 MG tablet Take 10 mg by mouth 2 (two) times daily.  . cholecalciferol (VITAMIN D) 25 MCG (1000 UNIT) tablet Take by mouth.  . donepezil (ARICEPT) 5 MG tablet TAKE 1 TABLET (5 MG TOTAL) BY MOUTH AT BEDTIME.  Marland Kitchen Ferrous Sulfate (IRON) 325 (65 Fe) MG TABS Take by mouth.  . furosemide (LASIX) 20 MG tablet Take 20 mg by mouth 3 (three) times daily.  . furosemide (LASIX) 40 MG tablet Take 40 mg by mouth daily.  . hydrocortisone (ANUSOL-HC) 2.5 % rectal cream Apply topically.  Marland Kitchen LORazepam (ATIVAN) 0.5 MG tablet Take by mouth.  . losartan (COZAAR) 100 MG tablet Take 100 mg by mouth daily.  Marland Kitchen losartan (COZAAR) 25 MG tablet Take 1 tablet by mouth daily.  . melatonin 3 MG TABS tablet Take by mouth.  . metoprolol succinate (TOPROL-XL) 50 MG 24 hr tablet Take 50 mg by mouth daily.  . Multiple Vitamin (MULTIVITAMIN ADULT PO) Take 1 tablet by mouth daily.  . potassium chloride (MICRO-K) 10 MEQ CR capsule Take by mouth 2 (two) times daily.  . sertraline (ZOLOFT) 50 MG tablet Take 50 mg by mouth daily.  . Skin Protectants, Misc. (MINERIN CREME) CREA      Allergies:   Meperidine, Clindamycin/lincomycin, Codeine, Demerol, Penicillins,  and Sulfa antibiotics   Social History   Socioeconomic History  . Marital status: Widowed    Spouse name: Timmothy Sours  . Number of children: Not on file  . Years of education: Not on file  . Highest education level: Not on file  Occupational History  . Not on file  Tobacco Use  . Smoking status: Former Smoker    Packs/day: 0.50    Years: 48.00    Pack years: 24.00    Types: Cigarettes    Quit date: 04/29/2007    Years since quitting: 12.8  . Smokeless tobacco: Never Used  Substance and Sexual Activity  . Alcohol use: Yes    Alcohol/week: 0.0 standard drinks    Comment: once or a twice a year.  . Drug use: No  . Sexual activity: Never  Other Topics Concern  . Not on file  Social History Narrative   Retired  Tour manager         Social Determinants of Radio broadcast assistant Strain:   . Difficulty of Paying Living Expenses: Not on file  Food Insecurity:   . Worried About Charity fundraiser in the Last Year: Not on file  . Ran Out of Food in the Last Year: Not on file  Transportation Needs:   . Lack of Transportation (Medical): Not on file  . Lack of Transportation (Non-Medical): Not on file  Physical Activity:   . Days of Exercise per Week: Not on file  . Minutes of Exercise per Session: Not on file  Stress:   . Feeling of Stress : Not on file  Social Connections:   . Frequency of Communication with Friends and Family: Not on file  . Frequency of Social Gatherings with Friends and Family: Not on file  . Attends Religious Services: Not on file  . Active Member of Clubs or Organizations: Not on file  . Attends Archivist Meetings: Not on file  . Marital Status: Not on file     Family History: The patient's family history includes Hypertension in her father. There is no history of Cancer.  ROS:   Please see the history of present illness.     All other systems reviewed and are negative.  EKGs/Labs/Other Studies Reviewed:    The following studies were reviewed today:   EKG:  EKG is  ordered today.  The ekg ordered today demonstrates sinus bradycardia, heart rate 53  Recent Labs: 12/11/2019: BUN 23; Creatinine, Ser 1.11; Hemoglobin 11.9; Platelets 170; Potassium 3.3; Sodium 140  Recent Lipid Panel    Component Value Date/Time   CHOL 221 (H) 04/05/2016 0841   TRIG 183 (H) 04/05/2016 0841   HDL 44 04/05/2016 0841   CHOLHDL 5 10/06/2014 0930   VLDL 50.2 (H) 10/06/2014 0930   LDLCALC 140 (H) 04/05/2016 0841   LDLDIRECT 112.0 10/06/2014 0930    Physical Exam:    VS:  BP (!) 162/82 (BP Location: Left Arm, Patient Position: Sitting, Cuff Size: Normal)   Pulse (!) 53   Ht 5' 3.5" (1.613 m)   Wt 133 lb 3.2 oz (60.4 kg)   SpO2 98%   BMI 23.23 kg/m     Wt  Readings from Last 3 Encounters:  02/21/20 133 lb 3.2 oz (60.4 kg)  12/11/19 136 lb 11 oz (62 kg)  11/14/16 139 lb 12.8 oz (63.4 kg)     GEN:  Well nourished, well developed mild distress HEENT: Normal NECK: No JVD; No carotid bruits LYMPHATICS:  No lymphadenopathy CARDIAC: RRR, no murmurs, rubs, gallops RESPIRATORY:  Clear to auscultation without rales, wheezing or rhonchi  ABDOMEN: Soft, non-tender, non-distended MUSCULOSKELETAL:  No edema; No deformity  SKIN: Warm and dry NEUROLOGIC: Alert, demented PSYCHIATRIC: Alert, demented  ASSESSMENT:    1. Diastolic dysfunction   2. Pulmonary HTN ([place])   3. Essential hypertension    PLAN:    In order of problems listed above:  1. Patient with history of edema, no edema noted on my exam.  Recent echocardiogram showed grade 1 diastolic dysfunction, normal LA pressures.  Diastolic dysfunction likely a combination of history of hypertension, hyperlipidemia, age.  LA pressures are normal and should not cause patient any symptoms.  No additional testing indicated. 2. History of moderate pulmonary hypertension, RVSP 45 mmHg on echocardiogram.  In this patient with dementia, recommend management of risk factors including hypertension.  Lasix can be given as needed for edema.  Patient appears euvolemic on my exam.  As such no additional testing, invasive testing such as right heart cath indicated at this point. 3. Hx of hypertension, continue BP meds.  Follows up with primary care physician at skilled nursing facility.  Follow-up as needed  Total encounter time 60 minutes  Greater than 50% was spent in counseling and coordination of care with the patient   This note was generated in part or whole with voice recognition software. Voice recognition is usually quite accurate but there are transcription errors that can and very often do occur. I apologize for any typographical errors that were not detected and corrected.  Medication  Adjustments/Labs and Tests Ordered: Current medicines are reviewed at length with the patient today.  Concerns regarding medicines are outlined above.  Orders Placed This Encounter  Procedures  . EKG 12-Lead   No orders of the defined types were placed in this encounter.   Patient Instructions  Medication Instructions:  Your physician recommends that you continue on your current medications as directed. Please refer to the Current Medication list given to you today.  *If you need a refill on your cardiac medications before your next appointment, please call your pharmacy*   Lab Work: None Ordered If you have labs (blood work) drawn today and your tests are completely normal, you will receive your results only by: Marland Kitchen MyChart Message (if you have MyChart) OR . A paper copy in the mail If you have any lab test that is abnormal or we need to change your treatment, we will call you to review the results.   Testing/Procedures: None Ordered   Follow-Up: At [facility], you and your health needs are our priority.  As part of our continuing mission to provide you with exceptional heart care, we have created designated Provider Care Teams.  These Care Teams include your primary Cardiologist (physician) and Advanced Practice Providers (APPs -  Physician Assistants and Nurse Practitioners) who all work together to provide you with the care you need, when you need it.  We recommend signing up for the patient portal called "MyChart".  Sign up information is provided on this After Visit Summary.  MyChart is used to connect with patients for Virtual Visits (Telemedicine).  Patients are able to view lab/test results, encounter notes, upcoming appointments, etc.  Non-urgent messages can be sent to your provider as well.   To learn more about what you can do with MyChart, go to [email or web address].    Your next appointment:   Follow up as needed   The format for your  next appointment:   In  Person  Provider:   Kate Sable, MD   Other Instructions      Signed, Kate Sable, MD  02/21/2020 12:33 PM    [place]

## 2021-04-24 ENCOUNTER — Encounter: Payer: Self-pay | Admitting: Emergency Medicine

## 2021-04-24 ENCOUNTER — Emergency Department
Admission: EM | Admit: 2021-04-24 | Discharge: 2021-04-24 | Disposition: A | Discharge status: Home / Self Care | Payer: Medicare Other | Attending: Emergency Medicine | Admitting: Emergency Medicine

## 2021-04-24 ENCOUNTER — Other Ambulatory Visit: Payer: Self-pay

## 2021-04-24 ENCOUNTER — Emergency Department: Payer: Medicare Other

## 2021-04-24 DIAGNOSIS — W19XXXA Unspecified fall, initial encounter: Secondary | ICD-10-CM | POA: Diagnosis not present

## 2021-04-24 DIAGNOSIS — Z5321 Procedure and treatment not carried out due to patient leaving prior to being seen by health care provider: Secondary | ICD-10-CM | POA: Diagnosis not present

## 2021-04-24 DIAGNOSIS — R410 Disorientation, unspecified: Secondary | ICD-10-CM | POA: Diagnosis not present

## 2021-04-24 LAB — COMPREHENSIVE METABOLIC PANEL
ALT: 12 U/L (ref 0–44)
AST: 20 U/L (ref 15–41)
Albumin: 4 g/dL (ref 3.5–5.0)
Alkaline Phosphatase: 72 U/L (ref 38–126)
Anion gap: 8 (ref 5–15)
BUN: 22 mg/dL (ref 8–23)
CO2: 30 mmol/L (ref 22–32)
Calcium: 9.3 mg/dL (ref 8.9–10.3)
Chloride: 101 mmol/L (ref 98–111)
Creatinine, Ser: 1.23 mg/dL — ABNORMAL HIGH (ref 0.44–1.00)
GFR, Estimated: 44 mL/min — ABNORMAL LOW (ref >60)
Glucose, Bld: 100 mg/dL — ABNORMAL HIGH (ref 70–99)
Potassium: 3 mmol/L — ABNORMAL LOW (ref 3.5–5.1)
Sodium: 139 mmol/L (ref 135–145)
Total Bilirubin: 0.7 mg/dL (ref 0.3–1.2)
Total Protein: 7 g/dL (ref 6.5–8.1)

## 2021-04-24 LAB — CBC WITH DIFFERENTIAL/PLATELET
Abs Immature Granulocytes: 0.03 10*3/uL (ref 0.00–0.07)
Basophils Absolute: 0.1 10*3/uL (ref 0.0–0.1)
Basophils Relative: 1 %
Eosinophils Absolute: 0.3 10*3/uL (ref 0.0–0.5)
Eosinophils Relative: 4 %
HCT: 34.9 % — ABNORMAL LOW (ref 36.0–46.0)
Hemoglobin: 11.5 g/dL — ABNORMAL LOW (ref 12.0–15.0)
Immature Granulocytes: 0 %
Lymphocytes Relative: 28 %
Lymphs Abs: 2.3 10*3/uL (ref 0.7–4.0)
MCH: 29.8 pg (ref 26.0–34.0)
MCHC: 33 g/dL (ref 30.0–36.0)
MCV: 90.4 fL (ref 80.0–100.0)
Monocytes Absolute: 0.8 10*3/uL (ref 0.1–1.0)
Monocytes Relative: 10 %
Neutro Abs: 4.7 10*3/uL (ref 1.7–7.7)
Neutrophils Relative %: 57 %
Platelets: 221 10*3/uL (ref 150–400)
RBC: 3.86 MIL/uL — ABNORMAL LOW (ref 3.87–5.11)
RDW: 15.1 % (ref 11.5–15.5)
WBC: 8.1 10*3/uL (ref 4.0–10.5)
nRBC: 0 % (ref 0.0–0.2)

## 2021-04-24 LAB — URINALYSIS, ROUTINE W REFLEX MICROSCOPIC
Bacteria, UA: NONE SEEN
Bilirubin Urine: NEGATIVE
Glucose, UA: NEGATIVE mg/dL
Ketones, ur: NEGATIVE mg/dL
Nitrite: NEGATIVE
Protein, ur: NEGATIVE mg/dL
Specific Gravity, Urine: 1.013 (ref 1.005–1.030)
Squamous Epithelial / HPF: NONE SEEN (ref 0–5)
pH: 5 (ref 5.0–8.0)

## 2021-04-24 LAB — TROPONIN I (HIGH SENSITIVITY)
Troponin I (High Sensitivity): 8 ng/L (ref <18)
Troponin I (High Sensitivity): 7 ng/L (ref <18)

## 2021-04-24 NOTE — ED Triage Notes (Addendum)
Pt arrived via [facility] from local SNF, pt is confused, states shes lives with her parents.  Pt here with a fall, unsure if it was witnessed or not, pt is poor historian.    Pt has small amount of blood to the back of her head.

## 2021-04-24 NOTE — ED Provider Notes (Signed)
Emergency Medicine Provider Triage Evaluation Note  Pam Keller , a 80 y.o. female  was evaluated in triage.  Pt complains of unwitnessed fall from nursing facility.  Takes full-strength aspirin per medication list.  Review of Systems  Positive: Posterior head laceration Negative: Vomiting  Physical Exam  BP (!) 184/91 (BP Location: Left Arm)   Pulse (!) 51   Temp 98.4 F (36.9 C) (Oral)   Resp 16   SpO2 100%  Gen:   Awake, no distress   Resp:  Normal effort  MSK:   Moves extremities without difficulty  Other:  Posterior head laceration  Medical Decision Making  Medically screening exam initiated at 2:51 AM.  Appropriate orders placed.  Pam Keller was informed that the remainder of the evaluation will be completed by another provider, this initial triage assessment does not replace that evaluation, and the importance of remaining in the ED until their evaluation is complete.  80 year old female brought to the ED status post unwitnessed fall with posterior head laceration.  Will obtain blood work, UA, CT imaging of head and cervical spine.  Patient will be evaluated by provider when she receives treatment room.   Pam Blanch, MD 04/24/21 [phone]

## 2022-05-18 ENCOUNTER — Encounter: Payer: Self-pay | Admitting: Emergency Medicine

## 2022-05-18 ENCOUNTER — Other Ambulatory Visit: Payer: Self-pay

## 2022-05-18 ENCOUNTER — Emergency Department
Admission: EM | Admit: 2022-05-18 | Discharge: 2022-05-18 | Disposition: A | Discharge status: Home / Self Care | Payer: Medicare Other | Attending: Emergency Medicine | Admitting: Emergency Medicine

## 2022-05-18 DIAGNOSIS — F0390 Unspecified dementia without behavioral disturbance: Secondary | ICD-10-CM | POA: Diagnosis not present

## 2022-05-18 DIAGNOSIS — I10 Essential (primary) hypertension: Secondary | ICD-10-CM | POA: Insufficient documentation

## 2022-05-18 DIAGNOSIS — T6591XA Toxic effect of unspecified substance, accidental (unintentional), initial encounter: Secondary | ICD-10-CM

## 2022-05-18 DIAGNOSIS — T578X1A Toxic effect of other specified inorganic substances, accidental (unintentional), initial encounter: Secondary | ICD-10-CM | POA: Insufficient documentation

## 2022-05-18 HISTORY — DX: Unspecified dementia, unspecified severity, without behavioral disturbance, psychotic disturbance, mood disturbance, and anxiety: F03.90

## 2022-05-18 LAB — COMPREHENSIVE METABOLIC PANEL
ALT: 10 U/L (ref 0–44)
AST: 21 U/L (ref 15–41)
Albumin: 3.9 g/dL (ref 3.5–5.0)
Alkaline Phosphatase: 69 U/L (ref 38–126)
Anion gap: 7 (ref 5–15)
BUN: 36 mg/dL — ABNORMAL HIGH (ref 8–23)
CO2: 30 mmol/L (ref 22–32)
Calcium: 9.8 mg/dL (ref 8.9–10.3)
Chloride: 105 mmol/L (ref 98–111)
Creatinine, Ser: 1.35 mg/dL — ABNORMAL HIGH (ref 0.44–1.00)
GFR, Estimated: 39 mL/min — ABNORMAL LOW (ref >60)
Glucose, Bld: 120 mg/dL — ABNORMAL HIGH (ref 70–99)
Potassium: 3.6 mmol/L (ref 3.5–5.1)
Sodium: 142 mmol/L (ref 135–145)
Total Bilirubin: 0.8 mg/dL (ref 0.3–1.2)
Total Protein: 6.8 g/dL (ref 6.5–8.1)

## 2022-05-18 LAB — CBC WITH DIFFERENTIAL/PLATELET
Abs Immature Granulocytes: 0.01 10*3/uL (ref 0.00–0.07)
Basophils Absolute: 0 10*3/uL (ref 0.0–0.1)
Basophils Relative: 1 %
Eosinophils Absolute: 0.2 10*3/uL (ref 0.0–0.5)
Eosinophils Relative: 4 %
HCT: 33.6 % — ABNORMAL LOW (ref 36.0–46.0)
Hemoglobin: 11 g/dL — ABNORMAL LOW (ref 12.0–15.0)
Immature Granulocytes: 0 %
Lymphocytes Relative: 19 %
Lymphs Abs: 1.2 10*3/uL (ref 0.7–4.0)
MCH: 28.6 pg (ref 26.0–34.0)
MCHC: 32.7 g/dL (ref 30.0–36.0)
MCV: 87.5 fL (ref 80.0–100.0)
Monocytes Absolute: 0.8 10*3/uL (ref 0.1–1.0)
Monocytes Relative: 13 %
Neutro Abs: 3.9 10*3/uL (ref 1.7–7.7)
Neutrophils Relative %: 63 %
Platelets: 190 10*3/uL (ref 150–400)
RBC: 3.84 MIL/uL — ABNORMAL LOW (ref 3.87–5.11)
RDW: 15.8 % — ABNORMAL HIGH (ref 11.5–15.5)
WBC: 6.1 10*3/uL (ref 4.0–10.5)
nRBC: 0 % (ref 0.0–0.2)

## 2022-05-18 NOTE — ED Triage Notes (Signed)
Patient in recliner in triage. Patient pleasantly confused. Not answering questions appropriately. Hx of dementia. Patient denies any pain.

## 2022-05-18 NOTE — ED Notes (Signed)
Pt presents via EMS c/o possible ingestion of lotion. Unknown if ingested. Dementia at baseline.

## 2022-05-18 NOTE — ED Provider Notes (Signed)
[facility] Provider Note  Patient Contact: 5:54 PM (approximate)   History   Ingestion   HPI  Pam Keller is a 81 y.o. female with a history of hypertension, hyperlipidemia, headaches, dementia, anxiety and depression, presents to the emergency department after patient was found with a bottle of [place] and staff for apprehensive that patient had consumed some of the [place].  Patient has had no nausea, vomiting or diarrhea.  No falls or other mechanisms of trauma.  Patient's daughter, Pam Keller is on the way to see patient.        Physical Exam   Triage Vital Signs: ED Triage Vitals  Enc Vitals Group     BP 05/18/22 1739 109/76     Pulse Rate 05/18/22 1739 74     Resp 05/18/22 1739 18     Temp 05/18/22 1739 98.2 F (36.8 C)     Temp Source 05/18/22 1739 Oral     SpO2 05/18/22 1739 97 %     Weight 05/18/22 1737 120 lb (54.4 kg)     Height 05/18/22 1737 '5\' 3"'$  (1.6 m)     Head Circumference --      Peak Flow --      Pain Score 05/18/22 1737 0     Pain Loc --      Pain Edu? --      Excl. in [place]? --     Most recent vital signs: Vitals:   05/18/22 1739 05/18/22 1812  BP: 109/76 (!) 141/76  Pulse: 74 69  Resp: 18 16  Temp: 98.2 F (36.8 C)   SpO2: 97% 98%     General: Alert and in no acute distress. Eyes:  PERRL. EOMI. Head: No acute traumatic findings ENT:      Nose: No congestion/rhinnorhea.      Mouth/Throat: Mucous membranes are moist. Neck: No stridor. No cervical spine tenderness to palpation. Cardiovascular:  Good peripheral perfusion Respiratory: Normal respiratory effort without tachypnea or retractions. Lungs CTAB. Good air entry to the bases with no decreased or absent breath sounds. Gastrointestinal: Bowel sounds 4 quadrants. Soft and nontender to palpation. No guarding or rigidity. No palpable masses. No distention. No CVA tenderness. Musculoskeletal: Full range of motion to all extremities.  Neurologic:  No  gross focal neurologic deficits are appreciated.  Skin:   No rash noted    ED Results / Procedures / Treatments   Labs (all labs ordered are listed, but only abnormal results are displayed) Labs Reviewed  CBC WITH DIFFERENTIAL/PLATELET - Abnormal; Notable for the following components:      Result Value   RBC 3.84 (*)    Hemoglobin 11.0 (*)    HCT 33.6 (*)    RDW 15.8 (*)    All other components within normal limits  COMPREHENSIVE METABOLIC PANEL - Abnormal; Notable for the following components:   Glucose, Bld 120 (*)    BUN 36 (*)    Creatinine, Ser 1.35 (*)    GFR, Estimated 39 (*)    All other components within normal limits  URINALYSIS, ROUTINE W REFLEX MICROSCOPIC       PROCEDURES:  Critical Care performed: No  Procedures   MEDICATIONS ORDERED IN ED: Medications - No data to display   IMPRESSION / MDM / [place] / ED COURSE  I reviewed the triage vital signs and the nursing notes.  Assessment and plan:  Accidental ingestion 81 year old female presents to the emergency department after accidentally ingesting lotion, [place] [address].   Vital signs are reassuring at triage.  On exam, patient was alert, active and nontoxic-appearing.  I reached out to poison control and spoke with [facility].  Zelphia Cairo states that patient might experience some diarrhea but is otherwise unconcerned and does not recommend a specific observation window for patient.  CBC and CMP consistent with patient's baseline.  Return precautions were given to return with new or worsening symptoms.     FINAL CLINICAL IMPRESSION(S) / ED DIAGNOSES   Final diagnoses:  Accidental ingestion of substance, initial encounter     Rx / DC Orders   ED Discharge Orders     None        Note:  This document was prepared using Dragon voice recognition software and may include unintentional dictation errors.   Vallarie Mare Grubbs, PA-C 05/18/22  1906    Carrie Mew, MD 05/18/22 1920

## 2022-07-25 IMAGING — CT CT HEAD W/O CM
3 series · 16 of 47 positions shown, 19 images · non-contrast
Comparison: 12/11/2019

CLINICAL DATA: Fall

EXAM:
CT HEAD WITHOUT CONTRAST
TECHNIQUE: Contiguous axial images were obtained from the base of the skull
through the vertex without intravenous contrast.

[Series 3: head wo · axial · 0.43mm/px · z∈[-155,-30]mm · 10 of 30 slices shown, 13 images]
[im 3/30  brain]
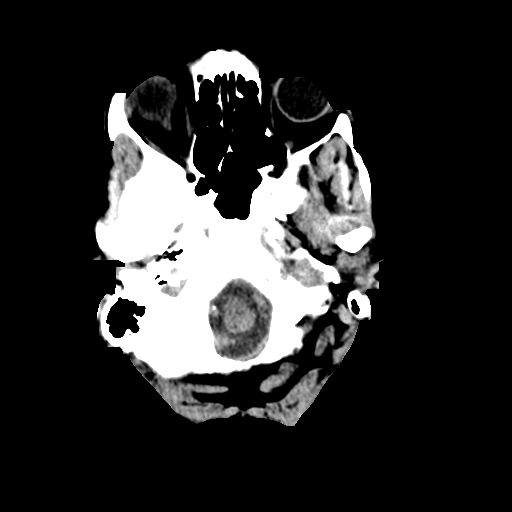
[im 3/30  bone]
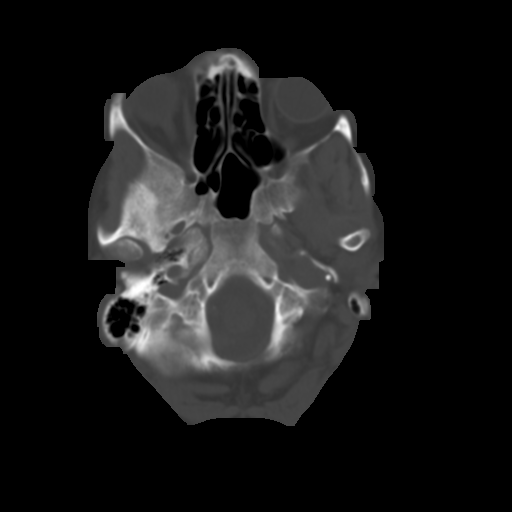
[im 6/30  brain]
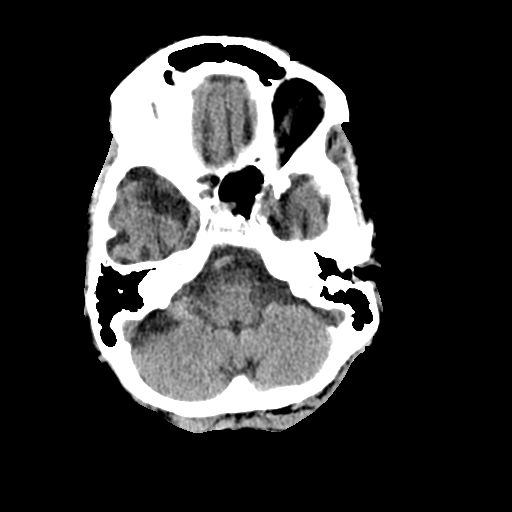
[im 9/30  brain]
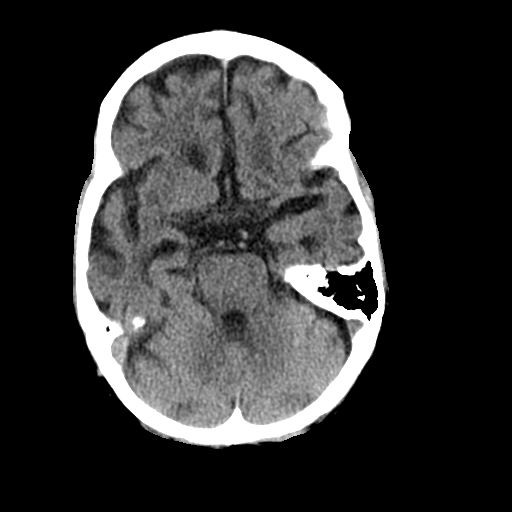
[im 11/30  brain]
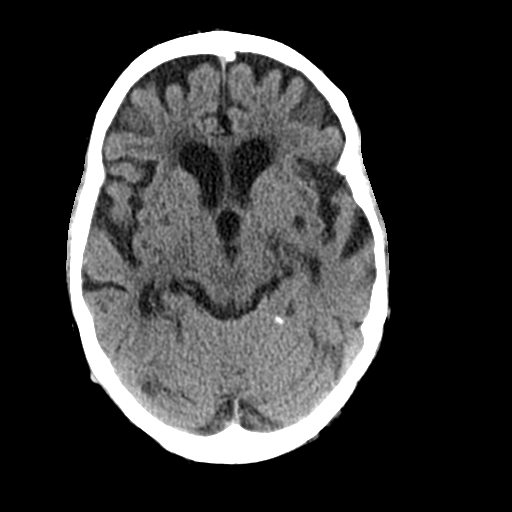
[im 14/30  brain]
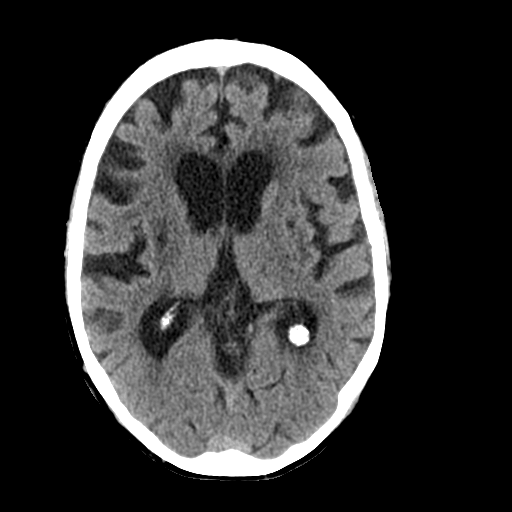
[im 14/30  bone]
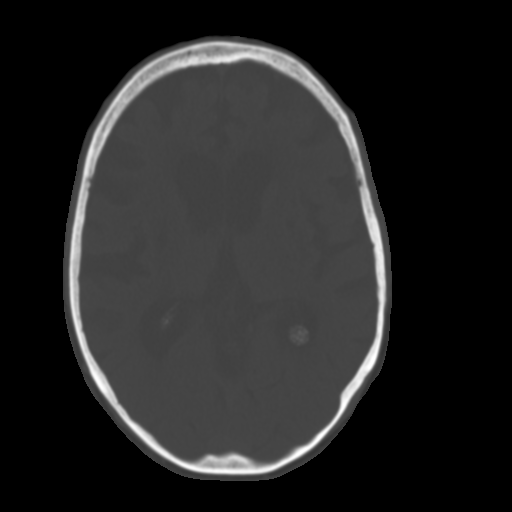
[im 17/30  brain]
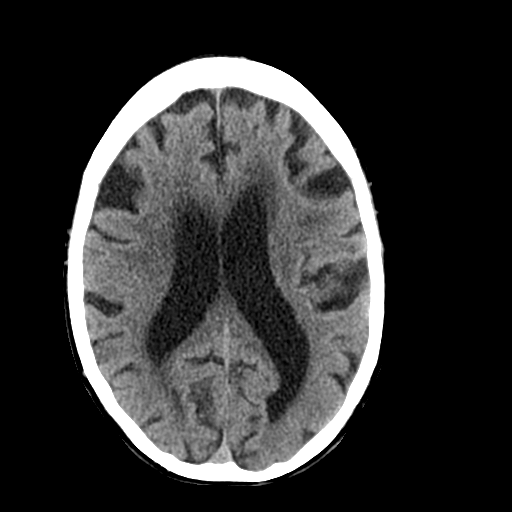
[im 20/30  brain]
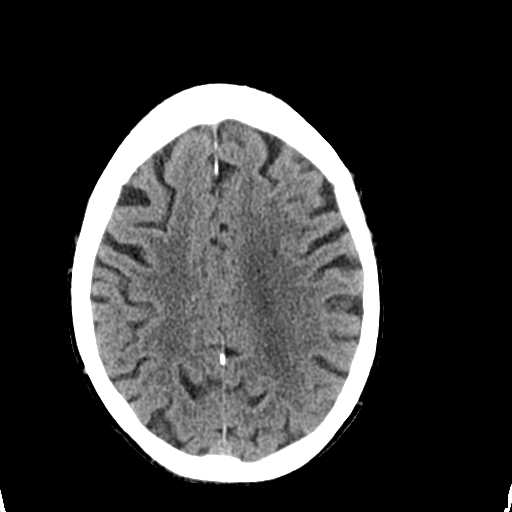
[im 23/30  brain]
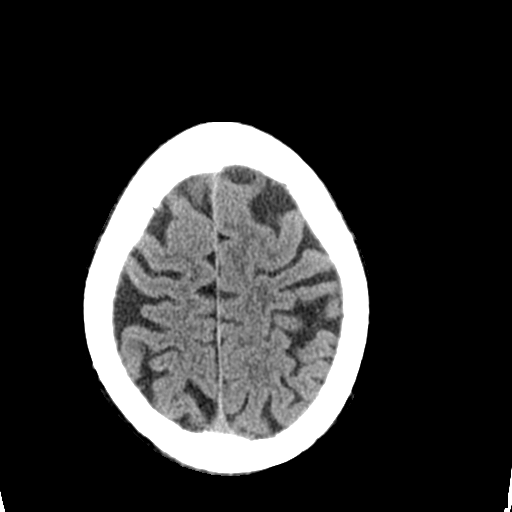
[im 25/30  brain]
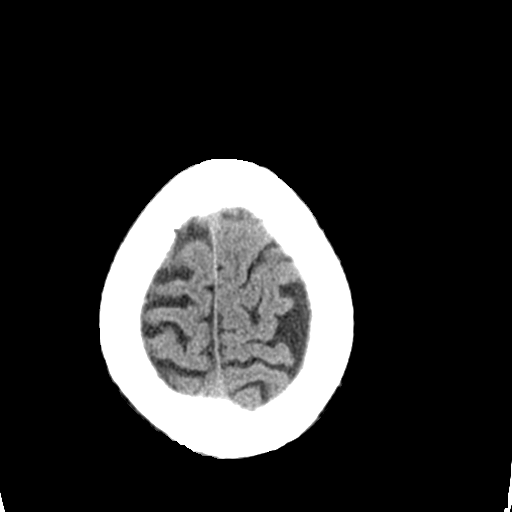
[im 25/30  bone]
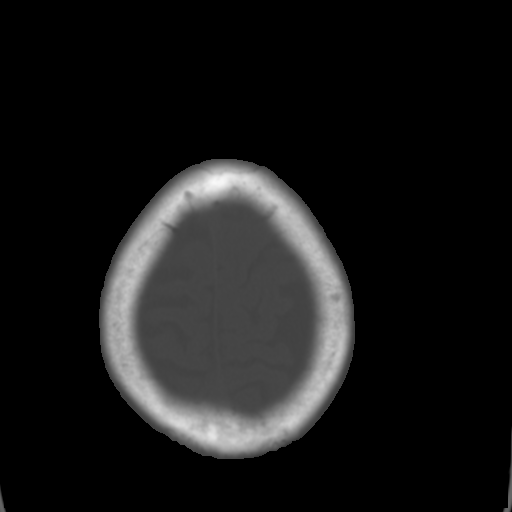
[im 28/30  brain]
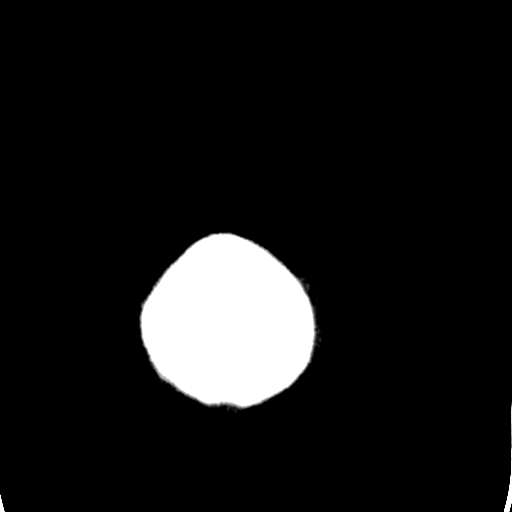

[Series 4: coronal soft tissue · coronal · 0.32mm/px · 3 of 67 slices shown]
[im 23/67  brain]
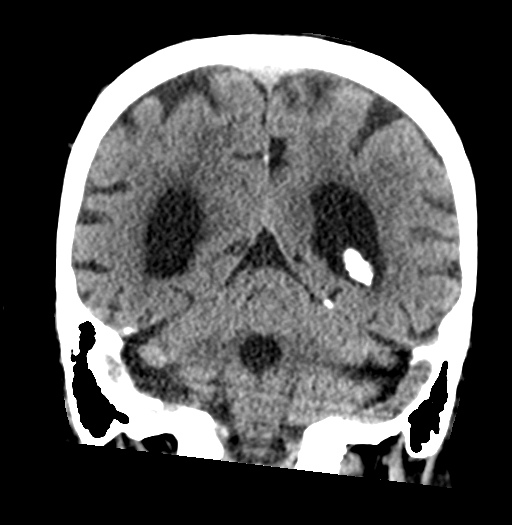
[im 30/67  brain]
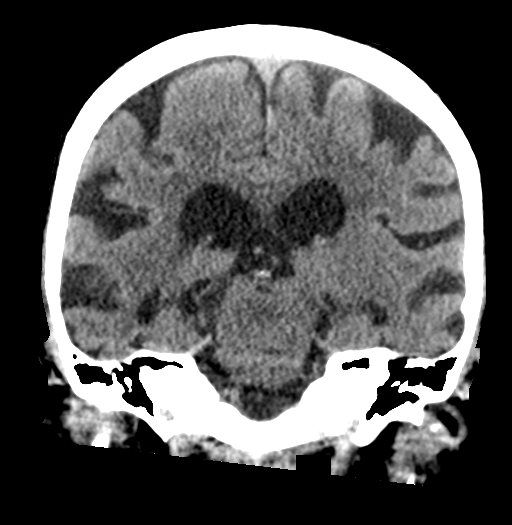
[im 37/67  brain]
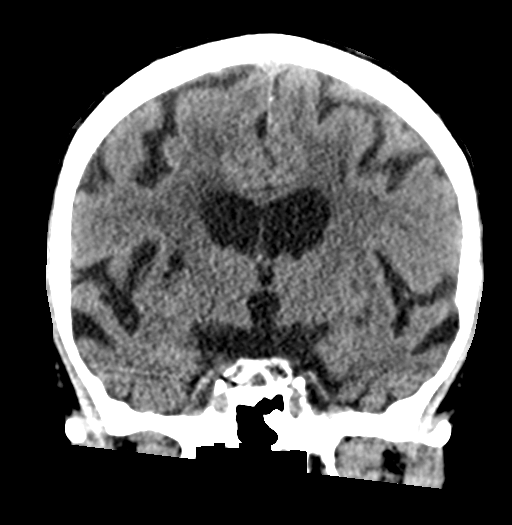

[Series 5: sagittal soft tissue · sagittal · 0.32mm/px · 3 of 54 slices shown]
[im 18/54  brain]
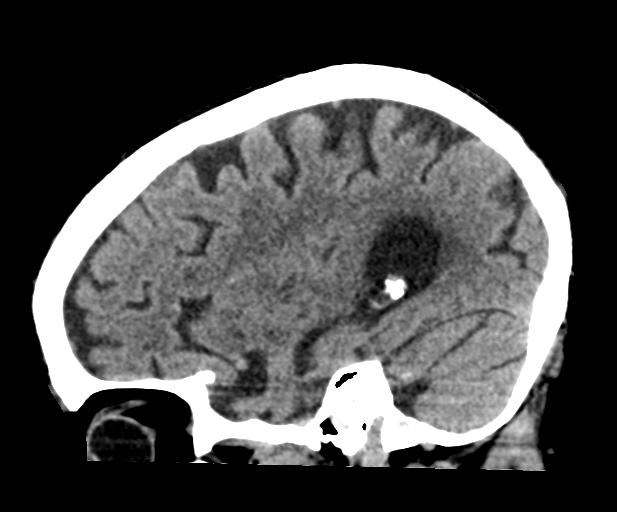
[im 27/54  brain]
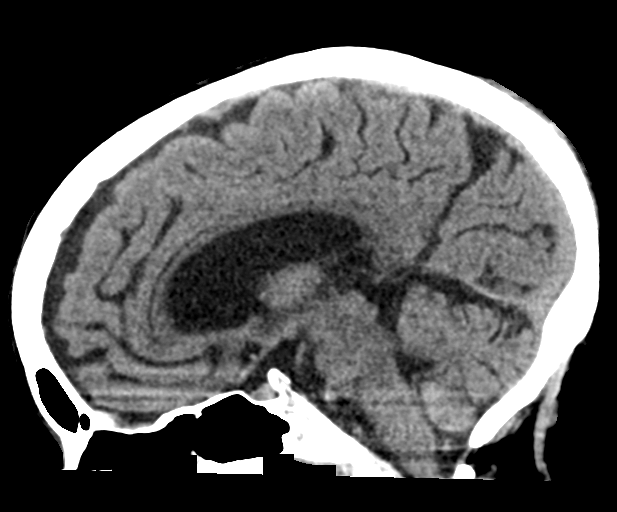
[im 36/54  brain]
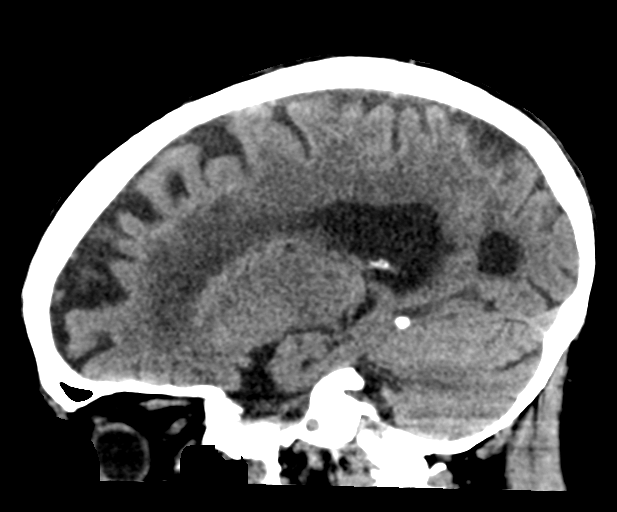

[16 of 47 positions shown; findings below may reference images not displayed]

FINDINGS: Brain: There is no mass, hemorrhage or extra-axial collection. There
is generalized atrophy without lobar predilection. Hypodensity of
the white matter is most commonly associated with chronic
microvascular disease. Old bilateral basal ganglia small vessel
infarcts.

Vascular: Atherosclerotic calcification of the internal carotid
arteries at the skull base. No abnormal hyperdensity of the major
intracranial arteries or dural venous sinuses.

Skull: The visualized skull base, calvarium and extracranial soft
tissues are normal.

Sinuses/Orbits: No fluid levels or advanced mucosal thickening of
the visualized paranasal sinuses. No mastoid or middle ear effusion.
The orbits are normal.
IMPRESSION: 1. No acute intracranial abnormality.
2. Chronic microvascular ischemia and generalized Cerebral Atrophy
([id number]).

## 2023-01-21 ENCOUNTER — Other Ambulatory Visit: Payer: Self-pay

## 2023-01-21 ENCOUNTER — Emergency Department: Payer: Medicare Other

## 2023-01-21 ENCOUNTER — Emergency Department
Admission: EM | Admit: 2023-01-21 | Discharge: 2023-01-21 | Disposition: A | Discharge status: Home / Self Care | Payer: Medicare Other | Attending: Emergency Medicine | Admitting: Emergency Medicine

## 2023-01-21 DIAGNOSIS — Y9289 Other specified places as the place of occurrence of the external cause: Secondary | ICD-10-CM | POA: Diagnosis not present

## 2023-01-21 DIAGNOSIS — W19XXXA Unspecified fall, initial encounter: Secondary | ICD-10-CM

## 2023-01-21 DIAGNOSIS — S0990XA Unspecified injury of head, initial encounter: Secondary | ICD-10-CM | POA: Diagnosis present

## 2023-01-21 DIAGNOSIS — W06XXXA Fall from bed, initial encounter: Secondary | ICD-10-CM | POA: Diagnosis not present

## 2023-01-21 NOTE — ED Triage Notes (Signed)
Pt from [facility]. Pt had mechanical fall onto floor from low bed with lac to occipital left head.  No blood thinners. Pt is at baseline A&OX1 self and pleasantly confused. Bp 186/90 SPO2 97% room air

## 2023-01-21 NOTE — ED Notes (Signed)
Bed alarm and non slip socks placed on pt

## 2023-01-21 NOTE — ED Notes (Signed)
EMS WAS CALLED FOR TRANSPORT FOR PT. TRANSPORT WILL ARRIVE AFTER 9 A.M.

## 2023-01-21 NOTE — ED Notes (Signed)
Daughter at bedside to pick pt up to bring back to facility.

## 2023-01-21 NOTE — ED Provider Notes (Signed)
[facility] Provider Note    Event Date/Time   First MD Initiated Contact with Patient 01/21/23 [phone]     (approximate)   History   Fall   HPI  Pam Keller is a 82 y.o. female with history of memory impairment presenting to the emergency department for evaluation after a fall.  Patient resides at [facility].  There, she had a fall from a bed low to the ground where she hit her head.  Noted to have some bleeding over the left side of her head.  Not on anticoagulation.  Baseline oriented only to self.  Patient denies pain in any location.  Unable to provide any history of the fall.  Denies chest pain or shortness of breath.     Physical Exam   Triage Vital Signs: ED Triage Vitals  Encounter Vitals Group     BP --      Systolic BP Percentile --      Diastolic BP Percentile --      Pulse --      Resp --      Temp 01/21/23 0528 98.7 F (37.1 C)     Temp Source 01/21/23 0528 Oral     SpO2 --      Weight 01/21/23 0527 120 lb 2.4 oz (54.5 kg)     Height 01/21/23 0527 5\' 3"  (1.6 m)     Head Circumference --      Peak Flow --      Pain Score --      Pain Loc --      Pain Education --      Exclude from Growth Chart --     Most recent vital signs: Vitals:   01/21/23 0528  Temp: 98.7 F (37.1 C)    Nursing notes and vital signs reviewed.  General: Adult female, laying in bed, sleeping but arouses to touch Head: There is an area of controlled bleeding over the left side of the head.  No significant underlying appreciable laceration Chest: Symmetric chest rise, no tenderness to palpation.  Cardiac: Regular rhythm and rate.  Respiratory: Lungs clear to auscultation Abdomen: Soft, nondistended. No tenderness to palpation.  Pelvis: Stable in AP and lateral compression. No tenderness to palpation. MSK: No deformity to bilateral upper and lower extremity.  Ranging bilateral upper and lower extremities freely without appreciable discomfort   Neuro: Alert, speaking in sentences and appropriate to context, appears to be at neurologic baseline. Skin: No evidence of burns   ED Results / Procedures / Treatments   Labs (all labs ordered are listed, but only abnormal results are displayed) Labs Reviewed - No data to display   EKG EKG independently reviewed interpreted by myself (ER attending) demonstrates:    RADIOLOGY Imaging independently reviewed and interpreted by myself demonstrates:    PROCEDURES:  Critical Care performed: No  Procedures   MEDICATIONS ORDERED IN ED: Medications - No data to display   IMPRESSION / MDM / ASSESSMENT AND PLAN / ED COURSE  I reviewed the triage vital signs and the nursing notes.  Differential diagnosis includes, but is not limited to, intracranial bleed, spine fracture, no evidence of thoracoabdominal trauma  Patient's presentation is most consistent with acute presentation with potential threat to life or bodily function.  82 year old female presenting after ground-level fall with evidence of head trauma.  CT head and C-spine fortunately without significant acute traumatic injuries.  Patient with documented tetanus vaccine in our system from 2022, no indication  for update currently.  On reevaluation, patient is awake, without acute complaints.  I did rinse out the area of bleeding over her scalp, but no appreciable laceration amenable to repair was noted.  To the patient is stable for discharge home.  Strict return precautions provided.     FINAL CLINICAL IMPRESSION(S) / ED DIAGNOSES   Final diagnoses:  Injury of head, initial encounter  Fall, initial encounter     Rx / DC Orders   ED Discharge Orders     None        Note:  This document was prepared using Dragon voice recognition software and may include unintentional dictation errors.   Trinna Post, MD 01/21/23 [phone]

## 2023-01-21 NOTE — ED Notes (Signed)
Bed is ready for pt. Reached out to nurse Barbee Cough to let her know.

## 2023-01-21 NOTE — Discharge Instructions (Addendum)
You were seen in the ER today for evaluation after fall.  Fortunately your CT scans did not show serious injuries.  Follow with your primary care doctor for further evaluation.  Return to the ER for new or worsening symptoms.

## 2023-03-03 ENCOUNTER — Telehealth: Payer: Self-pay

## 2023-03-03 NOTE — Telephone Encounter (Signed)
Copy mailed to patient  Copied from CRM [id number]. Topic: General - Other >> Feb 28, 2023 11:18 AM Dondra Prader A wrote: Reason for CRM: Pt daughter Tamaira Urben is calling to see if she can get the pt Vaccination record. Pt was a pt of Dr. Sullivan Lone and has been in memory care for the past 6 yrs. Cala Bradford is wanting to see if her Vaccination record could be mailed to her.  [address] [place], [place] [address]

## 2023-04-15 ENCOUNTER — Emergency Department
Admission: EM | Admit: 2023-04-15 | Discharge: 2023-04-15 | Disposition: A | Discharge status: Home / Self Care | Payer: Medicare Other | Attending: Emergency Medicine | Admitting: Emergency Medicine

## 2023-04-15 ENCOUNTER — Emergency Department: Payer: Medicare Other

## 2023-04-15 DIAGNOSIS — S0990XA Unspecified injury of head, initial encounter: Secondary | ICD-10-CM | POA: Diagnosis present

## 2023-04-15 DIAGNOSIS — Z7982 Long term (current) use of aspirin: Secondary | ICD-10-CM | POA: Diagnosis not present

## 2023-04-15 DIAGNOSIS — Z23 Encounter for immunization: Secondary | ICD-10-CM | POA: Diagnosis not present

## 2023-04-15 DIAGNOSIS — W19XXXA Unspecified fall, initial encounter: Secondary | ICD-10-CM | POA: Diagnosis not present

## 2023-04-15 DIAGNOSIS — R296 Repeated falls: Secondary | ICD-10-CM

## 2023-04-15 DIAGNOSIS — S0101XA Laceration without foreign body of scalp, initial encounter: Secondary | ICD-10-CM | POA: Diagnosis not present

## 2023-04-15 DIAGNOSIS — F0390 Unspecified dementia without behavioral disturbance: Secondary | ICD-10-CM | POA: Diagnosis not present

## 2023-04-15 DIAGNOSIS — I10 Essential (primary) hypertension: Secondary | ICD-10-CM | POA: Diagnosis not present

## 2023-04-15 DIAGNOSIS — Z85828 Personal history of other malignant neoplasm of skin: Secondary | ICD-10-CM | POA: Diagnosis not present

## 2023-04-15 DIAGNOSIS — Z79899 Other long term (current) drug therapy: Secondary | ICD-10-CM | POA: Diagnosis not present

## 2023-04-15 MED ORDER — TETANUS-DIPHTH-ACELL PERTUSSIS 5-2.5-18.5 LF-MCG/0.5 IM SUSY
0.5000 mL | PREFILLED_SYRINGE | Freq: Once | INTRAMUSCULAR | Status: AC
  Administered 2023-04-15: 0.5 mL via INTRAMUSCULAR
  Filled 2023-04-15: qty 0.5

## 2023-04-15 NOTE — ED Notes (Signed)
Report to [facility]

## 2023-04-15 NOTE — ED Triage Notes (Signed)
Pt arrives via [facility] from [facility] for CC of unwitnessed fall. Pt has small lac to back R of head. Per report pt was found in hall on floor, "severely" demented at baseline, and no thinners seen on med list. Staff gave pt ativan in attempt to calm pt down without success - pt not sleeping and calm.

## 2023-04-15 NOTE — ED Provider Notes (Signed)
[facility] Provider Note    Event Date/Time   First MD Initiated Contact with Patient 04/15/23 0119     (approximate)   History   Head Laceration   HPI  Pam Keller is a 82 y.o. female with history of dementia, hypertension, hyperlipidemia who had an unwitnessed fall from her nursing home.  Was agitated and received Ativan prior to arrival to our ED.  Patient is now calm and cooperative.  Last tetanus vaccine was in 2017.   History provided by EMS, patient's daughter.    Past Medical History:  Diagnosis Date   Basal cell carcinoma    Dementia (HCC)    Hyperlipidemia    Hypertension    Microscopic hematuria 06/24/2008   negative workuo   Pulmonary nodule     Past Surgical History:  Procedure Laterality Date   ABDOMINAL HYSTERECTOMY  1984   menorrhagia   APPENDECTOMY  1955   for rupture appx   BREAST BIOPSY     benign   CARPAL TUNNEL RELEASE     FOOT SURGERY     macular surgery     [facility], repaired   MOHS SURGERY  2012   for basal cell, forehead   OOPHORECTOMY  1960   TRIGGER FINGER RELEASE  2004   [facility]    MEDICATIONS:  Prior to Admission medications   Medication Sig Start Date End Date Taking? Authorizing Provider  amLODipine (NORVASC) 10 MG tablet Take 10 mg by mouth daily. 01/05/20   [provider]  amLODipine (NORVASC) 5 MG tablet TAKE 1 TABLET (5 MG TOTAL) BY MOUTH DAILY. 08/14/15   Sherlene Shams, MD  aspirin 325 MG tablet Take 325 mg by mouth daily.      [provider]  atorvastatin (LIPITOR) 10 MG tablet Take 10 mg by mouth daily. 02/04/20   [provider]  busPIRone (BUSPAR) 10 MG tablet Take 10 mg by mouth 2 (two) times daily. 02/04/20   [provider]  cholecalciferol (VITAMIN D) 25 MCG (1000 UNIT) tablet Take by mouth. 12/15/19   [provider]  donepezil (ARICEPT) 5 MG tablet TAKE 1 TABLET (5 MG TOTAL) BY MOUTH AT BEDTIME. 05/27/16   Bosie Clos, MD  Ferrous  Sulfate (IRON) 325 (65 Fe) MG TABS Take by mouth. 12/15/19   [provider]  furosemide (LASIX) 20 MG tablet Take 20 mg by mouth 3 (three) times daily. 02/05/20   [provider]  furosemide (LASIX) 40 MG tablet Take 40 mg by mouth daily. 01/25/20   [provider]  hydrocortisone (ANUSOL-HC) 2.5 % rectal cream Apply topically. 09/16/08   [provider]  LORazepam (ATIVAN) 0.5 MG tablet Take by mouth. 02/04/20   [provider]  losartan (COZAAR) 100 MG tablet Take 100 mg by mouth daily. 02/04/20   [provider]  losartan (COZAAR) 25 MG tablet Take 1 tablet by mouth daily. 11/09/10   [provider]  melatonin 3 MG TABS tablet Take by mouth. 12/15/19   [provider]  metoprolol succinate (TOPROL-XL) 50 MG 24 hr tablet Take 50 mg by mouth daily. 02/04/20   [provider]  Multiple Vitamin (MULTIVITAMIN ADULT PO) Take 1 tablet by mouth daily. 02/16/07   [provider]  potassium chloride (MICRO-K) 10 MEQ CR capsule Take by mouth 2 (two) times daily. 02/04/20   [provider]  sertraline (ZOLOFT) 50 MG tablet Take 50 mg by mouth daily.    [provider]  Skin Protectants, Misc. [facility] CREME) CREA  12/15/19   [provider]    Physical Exam   Triage Vital Signs: ED Triage Vitals  Encounter Vitals Group     BP 04/15/23 0126 133/81     Systolic BP Percentile --      Diastolic BP Percentile --      Pulse Rate 04/15/23 0126 (!) 54     Resp 04/15/23 0122 15     Temp 04/15/23 0126 97.6 F (36.4 C)     Temp Source 04/15/23 0126 Axillary     SpO2 04/15/23 0126 99 %     Weight --      Height --      Head Circumference --      Peak Flow --      Pain Score 04/15/23 0122 Asleep     Pain Loc --      Pain Education --      Exclude from Growth Chart --     Most recent vital signs: Vitals:   04/15/23 0122 04/15/23 0126  BP:  133/81  Pulse:  (!) 54  Resp: 15 17  Temp:  97.6 F  (36.4 C)  SpO2:  99%     CONSTITUTIONAL: Alert, elderly, will open eyes and move extremities but does not answer questions, drowsy HEAD: Normocephalic; 1 centimeter laceration to the posterior scalp EYES: Conjunctivae clear, PERRL, EOMI ENT: normal nose; no rhinorrhea; moist mucous membranes; pharynx without lesions noted; no dental injury; no septal hematoma, no epistaxis; no facial deformity or bony tenderness NECK: Supple, no midline spinal tenderness, step-off or deformity; trachea midline CARD: RRR; S1 and S2 appreciated; no murmurs, no clicks, no rubs, no gallops RESP: Normal chest excursion without splinting or tachypnea; breath sounds clear and equal bilaterally; no wheezes, no rhonchi, no rales; no hypoxia or respiratory distress CHEST:  chest wall stable, no crepitus or ecchymosis or deformity, nontender to palpation; no flail chest ABD/GI: Non-distended; soft, non-tender, no rebound, no guarding; no ecchymosis or other lesions noted PELVIS:  stable, nontender to palpation BACK:  The back appears normal; no midline spinal tenderness, step-off or deformity EXT: Normal ROM in all joints; no edema; normal capillary refill; no cyanosis, no bony tenderness or bony deformity of patient's extremities, no joint effusions, compartments are soft, extremities are warm and well-perfused, no ecchymosis SKIN: Normal color for age and race; warm NEURO: No facial asymmetry, moving all extremities equally  ED Results / Procedures / Treatments   LABS: (all labs ordered are listed, but only abnormal results are displayed) Labs Reviewed - No data to display   EKG:  EKG Interpretation Date/Time:    Ventricular Rate:    PR Interval:    QRS Duration:    QT Interval:    QTC Calculation:   R Axis:      Text Interpretation:            RADIOLOGY: My personal review and interpretation of imaging: CT head and cervical spine showed no acute abnormality.  I have personally reviewed all  radiology reports. CT Cervical Spine Wo Contrast  Result Date: 04/15/2023 CLINICAL DATA:  Neck trauma. Unwitnessed fall with laceration to the back of the head. EXAM: CT CERVICAL SPINE WITHOUT CONTRAST TECHNIQUE: Multidetector CT imaging of the cervical spine was performed without intravenous contrast. Multiplanar CT image reconstructions were also generated. RADIATION DOSE REDUCTION: This exam was performed according to the departmental dose-optimization program which includes automated exposure control, adjustment of the mA and/or  kV according to patient size and/or use of iterative reconstruction technique. COMPARISON:  01/21/2023 FINDINGS: Alignment: Straightening of the usual cervical lordosis with slight anterior subluxations at C3-4, and C4-5. Alignment appears unchanged since prior study, likely degenerative. Normal alignment of the posterior elements. Skull base and vertebrae: No acute fracture. No primary bone lesion or focal pathologic process. Soft tissues and spinal canal: No prevertebral fluid or swelling. No visible canal hematoma. Disc levels: Degenerative changes throughout the cervical spine with narrowed disc spaces and endplate osteophyte formation. Degenerative changes throughout the facet joints. Uncovertebral and facet joint spurring causes some bone encroachment upon the neural foramina bilaterally at multiple levels. Upper chest: Lung apices are clear. Other: None. IMPRESSION: 1. Unchanged alignment since prior study. 2. Degenerative changes throughout the cervical spine. 3. No acute displaced fractures identified. Electronically Signed   By: Burman Nieves M.D.   On: 04/15/2023 02:47   CT HEAD WO CONTRAST ( )  Result Date: 04/15/2023 CLINICAL DATA:  Minor head trauma. Unwitnessed fall with laceration to the back of the head. EXAM: CT HEAD WITHOUT CONTRAST TECHNIQUE: Contiguous axial images were obtained from the base of the skull through the vertex without intravenous contrast.  RADIATION DOSE REDUCTION: This exam was performed according to the departmental dose-optimization program which includes automated exposure control, adjustment of the mA and/or kV according to patient size and/or use of iterative reconstruction technique. COMPARISON:  01/21/2023 FINDINGS: Brain: Diffuse cerebral atrophy. Ventricular dilatation consistent with central atrophy. Low-attenuation changes in the deep white matter consistent with small vessel ischemia. No abnormal extra-axial fluid collections. No mass effect or midline shift. Gray-white matter junctions are distinct. Basal cisterns are not effaced. No acute intracranial hemorrhage. Vascular: No hyperdense vessel or unexpected calcification. Skull: Normal. Negative for fracture or focal lesion. Sinuses/Orbits: No acute finding. Other: None. IMPRESSION: No acute intracranial abnormalities. Chronic atrophy and small vessel ischemic changes. Electronically Signed   By: Burman Nieves M.D.   On: 04/15/2023 02:43     PROCEDURES:  Critical Care performed: No   LACERATION REPAIR Performed by: Baxter Hire Craven Crean Authorized by: Baxter Hire Lashandra Arauz Consent: Verbal consent obtained. Risks and benefits: risks, benefits and alternatives were discussed Consent given by: patient Patient identity confirmed: provided demographic data Prepped and Draped in normal sterile fashion Wound explored  Laceration Location: Posterior scalp  Laceration Length: 1cm  No Foreign Bodies seen or palpated  Anesthesia: none  Irrigation method: syringe Amount of cleaning: standard  Skin closure: 1 staple   Technique: Area irrigated with saline and 1 staple applied.  Wound approximation and hemostasis achieved.  Patient tolerance: Patient tolerated the procedure well with no immediate complications.    Procedures    IMPRESSION / MDM / ASSESSMENT AND PLAN / ED COURSE  I reviewed the triage vital signs and the nursing notes.  Patient here with dementia with  unwitnessed fall from her nursing home.  Has a Laceration.     DIFFERENTIAL DIAGNOSIS (includes but not limited to):   Scalp laceration, skull fracture, intracranial hemorrhage, cervical spine fracture  Patient's presentation is most consistent with acute presentation with potential threat to life or bodily function.  PLAN: Will obtain CT head and cervical spine.  Wound has been cleaned and repaired.  Will update tetanus vaccine.  No other sign of traumatic injury on exam.  Patient does not appear to be in distress.  Hemodynamically stable.   MEDICATIONS GIVEN IN ED: Medications  Tdap (BOOSTRIX) injection 0.5 mL (0.5 mLs Intramuscular Given 04/15/23 0321)  ED COURSE: CT scans reviewed and interpreted by myself and the radiologist and showed no acute abnormality.  Will discharge patient back to the nursing home.  Daughter comfortable with this plan.   At this time, I do not feel there is any life-threatening condition present. I reviewed all nursing notes, vitals, pertinent previous records.  All lab and urine results, EKGs, imaging ordered have been independently reviewed and interpreted by myself.  I reviewed all available radiology reports from any imaging ordered this visit.  Based on my assessment, I feel the patient is safe to be discharged home without further emergent workup and can continue workup as an outpatient as needed. Discussed all findings, treatment plan as well as usual and customary return precautions.  They verbalize understanding and are comfortable with this plan.  Outpatient follow-up has been provided as needed.  All questions have been answered.    CONSULTS:  none   OUTSIDE RECORDS REVIEWED: Reviewed last dermatology note at [facility] in November 2020.       FINAL CLINICAL IMPRESSION(S) / ED DIAGNOSES   Final diagnoses:  Unwitnessed fall  Scalp laceration, initial encounter  Injury of head, initial encounter     Rx / DC Orders   ED Discharge Orders      None        Note:  This document was prepared using Dragon voice recognition software and may include unintentional dictation errors.   Steen Bisig, Layla Maw, DO 04/15/23 [id number]

## 2023-12-18 ENCOUNTER — Other Ambulatory Visit: Payer: Self-pay

## 2023-12-18 ENCOUNTER — Emergency Department

## 2023-12-18 ENCOUNTER — Inpatient Hospital Stay
Admission: EM | Admit: 2023-12-18 | Discharge: 2023-12-21 | DRG: 690 | Disposition: A | Discharge status: Skilled Nursing Facility | Source: Skilled Nursing Facility | Attending: Family Medicine | Admitting: Family Medicine

## 2023-12-18 ENCOUNTER — Encounter: Payer: Self-pay | Admitting: Emergency Medicine

## 2023-12-18 DIAGNOSIS — S0990XA Unspecified injury of head, initial encounter: Secondary | ICD-10-CM

## 2023-12-18 DIAGNOSIS — Z85828 Personal history of other malignant neoplasm of skin: Secondary | ICD-10-CM

## 2023-12-18 DIAGNOSIS — Z880 Allergy status to penicillin: Secondary | ICD-10-CM | POA: Diagnosis not present

## 2023-12-18 DIAGNOSIS — E785 Hyperlipidemia, unspecified: Secondary | ICD-10-CM | POA: Diagnosis present

## 2023-12-18 DIAGNOSIS — N390 Urinary tract infection, site not specified: Secondary | ICD-10-CM | POA: Diagnosis present

## 2023-12-18 DIAGNOSIS — Z79899 Other long term (current) drug therapy: Secondary | ICD-10-CM | POA: Diagnosis not present

## 2023-12-18 DIAGNOSIS — F0390 Unspecified dementia without behavioral disturbance: Secondary | ICD-10-CM | POA: Diagnosis present

## 2023-12-18 DIAGNOSIS — Z881 Allergy status to other antibiotic agents status: Secondary | ICD-10-CM | POA: Diagnosis not present

## 2023-12-18 DIAGNOSIS — I129 Hypertensive chronic kidney disease with stage 1 through stage 4 chronic kidney disease, or unspecified chronic kidney disease: Secondary | ICD-10-CM | POA: Diagnosis present

## 2023-12-18 DIAGNOSIS — S42201A Unspecified fracture of upper end of right humerus, initial encounter for closed fracture: Secondary | ICD-10-CM | POA: Diagnosis present

## 2023-12-18 DIAGNOSIS — Z8249 Family history of ischemic heart disease and other diseases of the circulatory system: Secondary | ICD-10-CM

## 2023-12-18 DIAGNOSIS — Z87440 Personal history of urinary (tract) infections: Secondary | ICD-10-CM

## 2023-12-18 DIAGNOSIS — R410 Disorientation, unspecified: Secondary | ICD-10-CM

## 2023-12-18 DIAGNOSIS — W19XXXA Unspecified fall, initial encounter: Secondary | ICD-10-CM | POA: Diagnosis present

## 2023-12-18 DIAGNOSIS — Z882 Allergy status to sulfonamides status: Secondary | ICD-10-CM

## 2023-12-18 DIAGNOSIS — K5900 Constipation, unspecified: Secondary | ICD-10-CM | POA: Diagnosis present

## 2023-12-18 DIAGNOSIS — Z7982 Long term (current) use of aspirin: Secondary | ICD-10-CM | POA: Diagnosis not present

## 2023-12-18 DIAGNOSIS — Y92129 Unspecified place in nursing home as the place of occurrence of the external cause: Secondary | ICD-10-CM

## 2023-12-18 DIAGNOSIS — N1832 Chronic kidney disease, stage 3b: Secondary | ICD-10-CM | POA: Diagnosis present

## 2023-12-18 DIAGNOSIS — Z888 Allergy status to other drugs, medicaments and biological substances status: Secondary | ICD-10-CM

## 2023-12-18 DIAGNOSIS — N3000 Acute cystitis without hematuria: Secondary | ICD-10-CM | POA: Diagnosis not present

## 2023-12-18 DIAGNOSIS — T07XXXA Unspecified multiple injuries, initial encounter: Secondary | ICD-10-CM

## 2023-12-18 DIAGNOSIS — S61511A Laceration without foreign body of right wrist, initial encounter: Secondary | ICD-10-CM | POA: Diagnosis present

## 2023-12-18 DIAGNOSIS — Z885 Allergy status to narcotic agent status: Secondary | ICD-10-CM

## 2023-12-18 DIAGNOSIS — Z87891 Personal history of nicotine dependence: Secondary | ICD-10-CM | POA: Diagnosis not present

## 2023-12-18 DIAGNOSIS — S0101XA Laceration without foreign body of scalp, initial encounter: Secondary | ICD-10-CM | POA: Diagnosis present

## 2023-12-18 DIAGNOSIS — T148XXA Other injury of unspecified body region, initial encounter: Secondary | ICD-10-CM

## 2023-12-18 DIAGNOSIS — R531 Weakness: Principal | ICD-10-CM

## 2023-12-18 LAB — BRAIN NATRIURETIC PEPTIDE: B Natriuretic Peptide: 157.2 pg/mL — ABNORMAL HIGH (ref 0.0–100.0)

## 2023-12-18 LAB — CBC WITH DIFFERENTIAL/PLATELET
Abs Immature Granulocytes: 0.07 10*3/uL (ref 0.00–0.07)
Basophils Absolute: 0 10*3/uL (ref 0.0–0.1)
Basophils Relative: 0 %
Eosinophils Absolute: 0.1 10*3/uL (ref 0.0–0.5)
Eosinophils Relative: 1 %
HCT: 33.5 % — ABNORMAL LOW (ref 36.0–46.0)
Hemoglobin: 10.7 g/dL — ABNORMAL LOW (ref 12.0–15.0)
Immature Granulocytes: 1 %
Lymphocytes Relative: 9 %
Lymphs Abs: 0.9 10*3/uL (ref 0.7–4.0)
MCH: 29 pg (ref 26.0–34.0)
MCHC: 31.9 g/dL (ref 30.0–36.0)
MCV: 90.8 fL (ref 80.0–100.0)
Monocytes Absolute: 0.7 10*3/uL (ref 0.1–1.0)
Monocytes Relative: 7 %
Neutro Abs: 8.3 10*3/uL — ABNORMAL HIGH (ref 1.7–7.7)
Neutrophils Relative %: 82 %
Platelets: 237 10*3/uL (ref 150–400)
RBC: 3.69 MIL/uL — ABNORMAL LOW (ref 3.87–5.11)
RDW: 14.9 % (ref 11.5–15.5)
WBC: 10 10*3/uL (ref 4.0–10.5)
nRBC: 0 % (ref 0.0–0.2)

## 2023-12-18 LAB — TROPONIN I (HIGH SENSITIVITY)
Troponin I (High Sensitivity): 4 ng/L (ref <18)
Troponin I (High Sensitivity): 6 ng/L (ref <18)

## 2023-12-18 LAB — CK: Total CK: 94 U/L (ref 38–234)

## 2023-12-18 LAB — URINALYSIS, ROUTINE W REFLEX MICROSCOPIC
Bilirubin Urine: NEGATIVE
Glucose, UA: NEGATIVE mg/dL
Hgb urine dipstick: NEGATIVE
Ketones, ur: NEGATIVE mg/dL
Nitrite: NEGATIVE
Protein, ur: NEGATIVE mg/dL
Specific Gravity, Urine: 1.012 (ref 1.005–1.030)
WBC, UA: >50 WBC/hpf (ref 0–5)
pH: 7 (ref 5.0–8.0)

## 2023-12-18 LAB — COMPREHENSIVE METABOLIC PANEL WITH GFR
ALT: 13 U/L (ref 0–44)
AST: 21 U/L (ref 15–41)
Albumin: 3.8 g/dL (ref 3.5–5.0)
Alkaline Phosphatase: 63 U/L (ref 38–126)
Anion gap: 9 (ref 5–15)
BUN: 32 mg/dL — ABNORMAL HIGH (ref 8–23)
CO2: 27 mmol/L (ref 22–32)
Calcium: 9.7 mg/dL (ref 8.9–10.3)
Chloride: 105 mmol/L (ref 98–111)
Creatinine, Ser: 1.21 mg/dL — ABNORMAL HIGH (ref 0.44–1.00)
GFR, Estimated: 44 mL/min — ABNORMAL LOW (ref >60)
Glucose, Bld: 114 mg/dL — ABNORMAL HIGH (ref 70–99)
Potassium: 3.7 mmol/L (ref 3.5–5.1)
Sodium: 141 mmol/L (ref 135–145)
Total Bilirubin: 0.7 mg/dL (ref 0.0–1.2)
Total Protein: 6.9 g/dL (ref 6.5–8.1)

## 2023-12-18 LAB — LIPASE, BLOOD: Lipase: 38 U/L (ref 11–51)

## 2023-12-18 MED ORDER — ONDANSETRON HCL 4 MG PO TABS: 4.0000 mg | ORAL_TABLET | Freq: Four times a day (QID) | ORAL | Status: DC | PRN

## 2023-12-18 MED ORDER — ACETAMINOPHEN 325 MG PO TABS: 650.0000 mg | ORAL_TABLET | Freq: Four times a day (QID) | ORAL | Status: DC | PRN

## 2023-12-18 MED ORDER — ACETAMINOPHEN 650 MG RE SUPP: 650.0000 mg | Freq: Four times a day (QID) | RECTAL | Status: DC | PRN

## 2023-12-18 MED ORDER — TRAMADOL HCL 50 MG PO TABS
50.0000 mg | ORAL_TABLET | Freq: Four times a day (QID) | ORAL | Status: DC | PRN
  Administered 2023-12-18 – 2023-12-21 (×6): 50 mg via ORAL
  Filled 2023-12-18 (×6): qty 1

## 2023-12-18 MED ORDER — ENSURE PLUS HIGH PROTEIN PO LIQD
237.0000 mL | Freq: Two times a day (BID) | ORAL | Status: DC
  Administered 2023-12-18 – 2023-12-21 (×3): 237 mL via ORAL

## 2023-12-18 MED ORDER — ALBUTEROL SULFATE (2.5 MG/3ML) 0.083% IN NEBU
2.5000 mg | INHALATION_SOLUTION | RESPIRATORY_TRACT | Status: DC | PRN
Start: 2023-12-18 — End: 2023-12-21

## 2023-12-18 MED ORDER — HEPARIN SODIUM (PORCINE) 5000 UNIT/ML IJ SOLN
5000.0000 [IU] | Freq: Three times a day (TID) | INTRAMUSCULAR | Status: DC
  Administered 2023-12-18 – 2023-12-21 (×10): 5000 [IU] via SUBCUTANEOUS
  Filled 2023-12-18 (×10): qty 1

## 2023-12-18 MED ORDER — ONDANSETRON HCL 4 MG/2ML IJ SOLN: 4.0000 mg | Freq: Four times a day (QID) | INTRAMUSCULAR | Status: DC | PRN

## 2023-12-18 MED ORDER — SODIUM CHLORIDE 0.9 % IV SOLN
1.0000 g | INTRAVENOUS | Status: DC
  Administered 2023-12-18 – 2023-12-19 (×2): 1 g via INTRAVENOUS
  Filled 2023-12-18 (×2): qty 10

## 2023-12-18 NOTE — ED Notes (Signed)
 Daughters at bedside  States she has been unsteady on her feet

## 2023-12-18 NOTE — ED Provider Notes (Signed)
 [facility] Provider Note    Event Date/Time   First MD Initiated Contact with Patient 12/18/23 [phone]     (approximate)   History   Fall   HPI  Pam Keller is a 83 y.o. female with a past medical history of anxiety, depression, hypertension, hyperlipidemia, likely call in the memory care unit who presents today for evaluation after an unwitnessed fall.  Per EMS patient is ambulatory at baseline but has been holding her right shoulder.  Per EMS patient is nonverbal.  I spoke with Pam Keller from [facility] who reports that patient was found next to bed on ground during morning check this morning.  She is unsure how long she was on the ground given that nobody heard her fall, but she reports that they do rounds every 2 hours so she does not think that patient was on the ground for longer than 2 hours.  She thinks that the patient has been acting her normal self since the fall as well as before the fall.  Family at bedside, Pam Keller who is her daughter reports that she has been acting more sleepy and weak over the last few days.  Also notes that she has not had an appetite over the last couple of days.  Patient Active Problem List   Diagnosis Date Noted   UTI (urinary tract infection) 12/18/2023   Closed fracture of right proximal humerus 12/18/2023   Memory impairment 12/17/2014   Anxiety and depression 12/17/2014   Long-term use of high-risk medication 10/06/2014   Hypokalemia 11/19/2013   Chronic insomnia 10/05/2013   Carotid bruit present 10/04/2013   Occipital headache 09/29/2012   Neck stiffness 09/08/2012   Pseudophakia 04/23/2012   Screening for colon cancer 09/29/2011   Medicare annual wellness visit, subsequent 09/27/2011   Hyperlipidemia    Hypertension    Pulmonary nodule    Basal cell carcinoma    Screening for breast cancer 04/29/2011   Screening for cervical cancer 04/29/2011   History of postmenopausal HRT 04/29/2011   Microscopic  hematuria    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          Physical Exam   Triage Vital Signs: ED Triage Vitals  Encounter Vitals Group     BP 12/18/23 0739 (!) 144/71     Girls Systolic BP Percentile --      Girls Diastolic BP Percentile --      Boys Systolic BP Percentile --      Boys Diastolic BP Percentile --      Pulse Rate 12/18/23 0739 64     Resp 12/18/23 0739 16     Temp 12/18/23 0739 98.4 F (36.9 C)     Temp Source 12/18/23 0739 Oral     SpO2 12/18/23 0739 100 %     Weight 12/18/23 0747 121 lb 4.1 oz (55 kg)     Height 12/18/23 0747 5' 3 (1.6 m)     Head Circumference --      Peak Flow --      Pain Score --      Pain Loc --      Pain Education --      Exclude from Growth Chart --     Most recent vital signs: Vitals:   12/18/23 0739 12/18/23 1210  BP: (!) 144/71 (!) 140/68  Pulse: 64 60  Resp: 16 18  Temp: 98.4 F (36.9 C) 98.2 F (36.8 C)  SpO2: 100% 100%    Physical Exam Vitals and  nursing note reviewed.  Constitutional:      General: Somnolent but arousable. No acute distress.    Appearance: Normal appearance. The patient is normal weight.  HENT:     Head: Normocephalic.  Superficial abrasion noted to right side of head without hematoma, no active bleeding.  Mild crusting of blood to lips, no obvious intraoral injuries.  No epistaxis noted.  No Battle sign or raccoon eyes.    Mouth: Mucous membranes are moist.  Eyes:     General: PERRL. Normal EOMs        Right eye: No discharge.        Left eye: No discharge.     Conjunctiva/sclera: Conjunctivae normal.  Cardiovascular:     Rate and Rhythm: Normal rate and regular rhythm.     Pulses: Normal pulses.  Pulmonary:     Effort: Pulmonary effort is normal. No respiratory distress.     Breath sounds: Normal breath sounds.  No chest wall tenderness or ecchymosis Abdominal:     Abdomen is soft. There is no abdominal tenderness. No rebound or guarding. No distention.  No ecchymosis Musculoskeletal:         General: No swelling. Normal range of motion.     Cervical back: Normal range of motion and neck supple. No midline cervical spine tenderness.  Full range of motion of neck.  Negative Spurling test.  Negative Lhermitte sign.  Normal strength and sensation in bilateral upper extremities. Normal grip strength bilaterally.  Normal intrinsic muscle function of the hand bilaterally.  Normal radial pulses bilaterally. Right shoulder: Tenderness to palpation to right shoulder with pain with passive range of motion.  No open wounds. Pelvis stable.  Negative logroll bilaterally. Skin:    General: Skin is warm and dry.     Capillary Refill: Capillary refill takes less than 2 seconds.     Findings: No rash.  Neurological:     Mental Status: The patient is somnolent but awakens to loud voice.  Later during ER visit, patient became more alert, she is babbling without making sense which is baseline according to daughters at bedside.     ED Results / Procedures / Treatments   Labs (all labs ordered are listed, but only abnormal results are displayed) Labs Reviewed  URINALYSIS, ROUTINE W REFLEX MICROSCOPIC - Abnormal; Notable for the following components:      Result Value   Color, Urine YELLOW (*)    APPearance CLOUDY (*)    Leukocytes,Ua LARGE (*)    Bacteria, UA RARE (*)    All other components within normal limits  COMPREHENSIVE METABOLIC PANEL WITH GFR - Abnormal; Notable for the following components:   Glucose, Bld 114 (*)    BUN 32 (*)    Creatinine, Ser 1.21 (*)    GFR, Estimated 44 (*)    All other components within normal limits  CBC WITH DIFFERENTIAL/PLATELET - Abnormal; Notable for the following components:   RBC 3.69 (*)    Hemoglobin 10.7 (*)    HCT 33.5 (*)    Neutro Abs 8.3 (*)    All other components within normal limits  BRAIN NATRIURETIC PEPTIDE - Abnormal; Notable for the following components:   B Natriuretic Peptide 157.2 (*)    All other components within normal limits   LIPASE, BLOOD  CK  TROPONIN I (HIGH SENSITIVITY)  TROPONIN I (HIGH SENSITIVITY)     EKG     RADIOLOGY I independently reviewed and interpreted imaging and agree with radiologists findings.  PROCEDURES:  Critical Care performed:   Procedures   MEDICATIONS ORDERED IN ED: Medications  cefTRIAXone  (ROCEPHIN ) 1 g in sodium chloride  0.9 % 100 mL IVPB (0 g Intravenous Stopped 12/18/23 1211)  heparin  injection 5,000 Units (has no administration in time range)  acetaminophen  (TYLENOL ) tablet 650 mg (has no administration in time range)    Or  acetaminophen  (TYLENOL ) suppository 650 mg (has no administration in time range)  ondansetron  (ZOFRAN ) tablet 4 mg (has no administration in time range)    Or  ondansetron  (ZOFRAN ) injection 4 mg (has no administration in time range)  albuterol  (PROVENTIL ) (2.5 MG/3ML) 0.083% nebulizer solution 2.5 mg (has no administration in time range)  feeding supplement (ENSURE PLUS HIGH PROTEIN) liquid 237 mL (has no administration in time range)  traMADol  (ULTRAM ) tablet 50 mg (has no administration in time range)     IMPRESSION / MDM / ASSESSMENT AND PLAN / ED COURSE  I reviewed the triage vital signs and the nursing notes.   Differential diagnosis includes, but is not limited to, fracture, dislocation, intracranial hemorrhage, cervical spine injury, UTI, electrolyte disarray.  Patient is somnolent but arousable.  She awakens to loud voice.  CT head and neck obtained per [place] criteria and also given that this was an unwitnessed fall.  Labs also obtained including a CPK given that she was on the ground for an unknown period of time.  Urinalysis obtained for evaluation of UTI given her reported weakness and somnolence over the past couple of days and decrease in appetite.  X-rays obtained of locations of pain.  After initial imaging studies ordered, daughters requested x-rays of her elbows and face as well which were added on.  Labs reveal  BUN to creatinine ratio suggestive of prerenal volume depletion, otherwise unremarkable including a negative troponin.  Urinalysis is suggestive of urinary tract infection with greater than 50 WBCs.  She was given a dose of Rocephin .  CT imaging is unremarkable with the exception of her right shoulder x-ray which reveals a proximal humerus fracture.  Given her urinary tract infection and her recent increased confusion and weakness and decreased appetite, I recommended admission to the hospitalist service which daughters are in agreement with.  I consulted the hospitalist who has agreed to admit.  The hospitalist did request an orthopedic consult for the proximal humerus fracture, and subsequently I consulted orthopedics.  Dr. Abdomen came to evaluate the patient at bedside and agrees with plan for sling and no further management indicated at this time.  Patient was accepted by Dr. Debby.   Patient's presentation is most consistent with acute presentation with potential threat to life or bodily function.   Clinical Course as of 12/18/23 1408  Thu Dec 18, 2023  0938 Family requesting CT face and x-ray elbows as well [JP]    Clinical Course User Index [JP] Niyah Mamaril E, PA-C     FINAL CLINICAL IMPRESSION(S) / ED DIAGNOSES   Final diagnoses:  Minor head injury, initial encounter  Laceration of scalp, initial encounter  Abrasion  Multiple contusions  Closed fracture of proximal end of right humerus, unspecified fracture morphology, initial encounter  Acute cystitis without hematuria  Weakness  Confusion     Rx / DC Orders   ED Discharge Orders     None        Note:  This document was prepared using Dragon voice recognition software and may include unintentional dictation errors.   Cheyrl Buley E, PA-C 12/18/23 1409    Ray,  Neha, MD 12/19/23 8087

## 2023-12-18 NOTE — ED Notes (Signed)
 See triage note.Presents via EMS from [facility] .  Pt had unwitnessed fall Small laceration to forehead   Pt is grimacing with movement of both arms.

## 2023-12-18 NOTE — Consult Note (Signed)
 ORTHOPAEDIC CONSULTATION  REQUESTING PHYSICIAN: Debby Camila LABOR, MD  Chief Complaint:   Right proximal humerus fracture  History of Present Illness: Pam Keller is a 83 y.o. female with a past medical history of anxiety, depression, hypertension, hyperlipidemia, [facility] in the memory care unit who presents today for evaluation after an unwitnessed fall.  Patient endorsed pain in the right shoulder and was found to have right proximal humerus fracture.  Other workup for other injuries was negative.  Did sustain a skin tear over the volar right wrist which has already been dressed by the ED provider.  Daughter is at bedside to assist with history.  Reports she is normally ambulatory without any assistive devices but has been more shaky on her feet the last few days likely leading to the fall.  Denies any previous seen pain in her shoulders or hips.  Patient able to answer very simple questions and follow some commands.  Past Medical History:  Diagnosis Date   Basal cell carcinoma    Dementia (HCC)    Hyperlipidemia    Hypertension    Microscopic hematuria 06/24/2008   negative workuo   Pulmonary nodule    Past Surgical History:  Procedure Laterality Date   ABDOMINAL HYSTERECTOMY  1984   menorrhagia   APPENDECTOMY  1955   for rupture appx   BREAST BIOPSY     benign   CARPAL TUNNEL RELEASE     FOOT SURGERY     macular surgery     [facility], repaired   MOHS SURGERY  2012   for basal cell, forehead   OOPHORECTOMY  1960   TRIGGER FINGER RELEASE  2004   [facility]   Social History   Socioeconomic History   Marital status: Widowed    Spouse name: Todd   Number of children: Not on file   Years of education: Not on file   Highest education level: Not on file  Occupational History   Not on file  Tobacco Use   Smoking status: Former    Current packs/day: 0.00    Average packs/day: 0.5 packs/day for 48.0 years  (24.0 ttl pk-yrs)    Types: Cigarettes    Start date: 04/29/1959    Quit date: 04/29/2007    Years since quitting: 16.6   Smokeless tobacco: Never  Substance and Sexual Activity   Alcohol use: Yes    Alcohol/week: 0.0 standard drinks of alcohol    Comment: once or a twice a year.   Drug use: No   Sexual activity: Not Currently  Other Topics Concern   Not on file  Social History Narrative   Retired Paramedic         Social Drivers of Corporate investment banker Strain: Not on Ship broker Insecurity: Not on file  Transportation Needs: Not on file  Physical Activity: Not on file  Stress: Not on file  Social Connections: Not on file   Family History  Problem Relation Age of Onset   Hypertension Father    Cancer Neg Hx    Allergies  Allergen Reactions   Meperidine Other (See Comments)    Other Reaction: GI Upset Other reaction(s): Other (See Comments) Other reaction(s): Other (See Comments) Other Reaction: GI Upset Other Reaction: GI Upset   Clindamycin    Clindamycin/Lincomycin    Demerol    Sulfa Antibiotics    Codeine     Other reaction(s): Other (See Comments) Other Reaction: GI Upset   Penicillins  Other reaction(s): Other (See Comments) Other Reaction: GI Upset   Prior to Admission medications   Medication Sig Start Date End Date Taking? Authorizing Provider  amLODipine  (NORVASC ) 10 MG tablet Take 10 mg by mouth daily. 01/05/20   [provider]  amLODipine  (NORVASC ) 5 MG tablet TAKE 1 TABLET (5 MG TOTAL) BY MOUTH DAILY. 08/14/15   Marylynn Verneita CROME, MD  aspirin 325 MG tablet Take 325 mg by mouth daily.      [provider]  atorvastatin (LIPITOR) 10 MG tablet Take 10 mg by mouth daily. 02/04/20   [provider]  busPIRone (BUSPAR) 10 MG tablet Take 10 mg by mouth 2 (two) times daily. 02/04/20   [provider]  cholecalciferol (VITAMIN D ) 25 MCG (1000 UNIT) tablet Take by mouth. 12/15/19   [provider]   donepezil  (ARICEPT ) 5 MG tablet TAKE 1 TABLET (5 MG TOTAL) BY MOUTH AT BEDTIME. 05/27/16   Bertrum Charlie CROME, MD  Ferrous Sulfate (IRON) 325 (65 Fe) MG TABS Take by mouth. 12/15/19   [provider]  furosemide (LASIX) 20 MG tablet Take 20 mg by mouth 3 (three) times daily. 02/05/20   [provider]  furosemide (LASIX) 40 MG tablet Take 40 mg by mouth daily. 01/25/20   [provider]  hydrocortisone (ANUSOL-HC) 2.5 % rectal cream Apply topically. 09/16/08   [provider]  LORazepam (ATIVAN) 0.5 MG tablet Take by mouth. 02/04/20   [provider]  losartan  (COZAAR ) 100 MG tablet Take 100 mg by mouth daily. 02/04/20   [provider]  losartan  (COZAAR ) 25 MG tablet Take 1 tablet by mouth daily. 11/09/10   [provider]  melatonin 3 MG TABS tablet Take by mouth. 12/15/19   [provider]  metoprolol succinate (TOPROL-XL) 50 MG 24 hr tablet Take 50 mg by mouth daily. 02/04/20   [provider]  Multiple Vitamin (MULTIVITAMIN ADULT PO) Take 1 tablet by mouth daily. 02/16/07   [provider]  potassium chloride  (MICRO-K ) 10 MEQ CR capsule Take by mouth 2 (two) times daily. 02/04/20   [provider]  sertraline (ZOLOFT) 50 MG tablet Take 50 mg by mouth daily.    [provider]  Skin Protectants, Misc. (MINERIN CREME) CREA  12/15/19   [provider]   DG Hand Complete Right Result Date: 12/18/2023 CLINICAL DATA:  fall, skin tear to right hand EXAM: RIGHT HAND - COMPLETE 3+ VIEW COMPARISON:  None Available. FINDINGS: Diffuse osteopenia.No acute fracture or dislocation. Severe joint space loss at the base of the thumb. Mild joint space loss of the DIP of the first and second digits. No radiopaque foreign body. IMPRESSION: 1. Diffuse osteopenia.  No acute fracture or dislocation. 2. 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 Electronically Signed   By: Rogelia Myers M.D.   On: 12/18/2023  11:52   CT Thoracic Spine Wo Contrast Result Date: 12/18/2023 CLINICAL DATA:  Back trauma, no prior imaging (Age >= 16y) EXAM: CT THORACIC SPINE WITHOUT CONTRAST TECHNIQUE: Multidetector CT images of the thoracic were obtained using the standard protocol without intravenous contrast. RADIATION DOSE REDUCTION: This exam was performed according to the departmental dose-optimization program which includes automated exposure control, adjustment of the mA and/or kV according to patient size and/or use of iterative reconstruction technique. COMPARISON:  None Available. FINDINGS: Alignment: Mild dextroscoliosis. Slight degenerative anterolisthesis at C7-T1, T2-3 and T3-4. Vertebrae: Mild done written bone deformity of the superior endplate of T4, of uncertain  chronicity. There is mild sclerosis of the endplate favoring chronicity. No apparent acute fracture. No osseous lesions are present. Paraspinal and other soft tissues: No paraspinous or epidural hematoma and no soft tissue swelling. Mild fusiform aneurysmal dilatation of the descending thoracic aorta, which measures up to 4.4 x 4.0 cm in cross-sectional diameter. The ascending thoracic aorta is mildly ectatic measuring up to 3.4 cm in diameter. There is mild mosaic attenuation of the lungs. There are hazy and reticular opacities present dependently within the lower lobes bilaterally. There is also mild bronchiolectasis. Disc levels: There is disc space narrowing and endplate ridging present diffusely throughout the thoracic spine. There is no significant disc herniation. There is also no significant spinal canal or neural foraminal stenosis evident. IMPRESSION: 1. Mild downward bowing deformity of the superior endplate of T4, likely chronic. 2. 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 Electronically Signed   By: Evalene Coho M.D.   On: 12/18/2023 10:46   CT Maxillofacial WO CM Result Date: 12/18/2023 CLINICAL DATA:  Facial trauma, blunt EXAM: CT  MAXILLOFACIAL WITHOUT CONTRAST TECHNIQUE: Multidetector CT imaging of the maxillofacial structures was performed. Multiplanar CT image reconstructions were also generated. RADIATION DOSE REDUCTION: This exam was performed according to the departmental dose-optimization program which includes automated exposure control, adjustment of the mA and/or kV according to patient size and/or use of iterative reconstruction technique. COMPARISON:  Maxillofacial CT dated December 11, 2019. FINDINGS: Osseous: Chronic blowout fracture of the left orbital floor. No acute facial fracture. No osseous lesions. Numerous dental caries. Moderate bilateral cervical facet arthrosis. Orbits: Mild left enophthalmos secondary to orbital floor fracture. There is orbital fat and the inferior rectus muscle partially protruding through the defect. Status post bilateral lens replacement. Sinuses: Mild polypoid mucosal disease within the floor the left maxillary sinus. Soft tissues: No significant soft tissue swelling evident. Limited intracranial: Moderate cerebral atrophy. Chronic lacunar infarct within the right basal ganglia. IMPRESSION: 1. No evidence of acute traumatic injury. 2. Chronic blowout fracture of the left inferior orbital wall with associated enophthalmos. Electronically Signed   By: Evalene Coho M.D.   On: 12/18/2023 10:36   CT Lumbar Spine Wo Contrast Result Date: 12/18/2023 CLINICAL DATA:  Back trauma, no prior imaging (Age >= 16y) EXAM: CT LUMBAR SPINE WITHOUT CONTRAST TECHNIQUE: Multidetector CT imaging of the lumbar spine was performed without intravenous contrast administration. Multiplanar CT image reconstructions were also generated. RADIATION DOSE REDUCTION: This exam was performed according to the departmental dose-optimization program which includes automated exposure control, adjustment of the mA and/or kV according to patient size and/or use of iterative reconstruction technique. COMPARISON:  None Available.  FINDINGS: Segmentation: 5 lumbar type vertebrae. Alignment: Slight degenerative retrolisthesis at L2-3 and also slight degenerative anterolisthesis at L4-5. Vertebrae: Vertebral bodies maintain their height. No evidence of fracture or osseous lesion. Paraspinal and other soft tissues: No paraspinous hematoma or soft tissue injury evident. There are simple appearing exophytic cyst arising from the kidneys bilaterally. Moderate atheromatous disease within the abdominal aorta and mild ectasia. Disc levels: L1-2: Minimal disc bulging. No significant spinal canal or neural foraminal stenosis. L2-3: Mild degenerative retrolisthesis. Diffuse posterior disc bulging and moderate bilateral facet arthrosis, resulting in moderate central spinal canal stenosis and bilateral lateral recess stenosis, with questionable impingement of the L3 nerves in the lateral recesses. L3-4: Chronic degenerative disc disease and bilateral facet arthrosis, with moderate to severe central spinal canal stenosis and bilateral lateral recess stenosis. There is likely impingement of the L4 nerves in the  lateral recesses. L4-5: Degenerative anterolisthesis with mild diffuse disc bulging and moderate bilateral facet arthrosis. There is thickening and calcification of the ligamentum flavum and moderate to severe central spinal canal stenosis and bilateral lateral recess stenosis. There is likely impingement of the L5 nerves in the lateral recesses. L5-S1: Right posterolateral disc protrusion and diffuse endplate ridging. There is also moderate bilateral facet arthrosis. Moderate central spinal canal stenosis and moderate to severe bilateral neural foraminal stenosis. IMPRESSION: 1. No evidence of acute traumatic injury. 2. Multilevel degenerative disc disease and facet arthrosis with varying central spinal canal stenosis and lateral recess stenosis with likely impingement of the transiting nerves in the lateral recesses at L2-3, L3-4 and L4-5.  Electronically Signed   By: Evalene Coho M.D.   On: 12/18/2023 10:29   CT Head Wo Contrast Result Date: 12/18/2023 CLINICAL DATA:  Head trauma. Un witnessed fall. Small laceration to right side of the head. EXAM: CT HEAD WITHOUT CONTRAST CT CERVICAL SPINE WITHOUT CONTRAST TECHNIQUE: Multidetector CT imaging of the head and cervical spine was performed following the standard protocol without intravenous contrast. Multiplanar CT image reconstructions of the cervical spine were also generated. RADIATION DOSE REDUCTION: This exam was performed according to the departmental dose-optimization program which includes automated exposure control, adjustment of the mA and/or kV according to patient size and/or use of iterative reconstruction technique. COMPARISON:  04/15/2023 FINDINGS: CT HEAD FINDINGS Brain: No evidence of acute infarction, hemorrhage, hydrocephalus, extra-axial collection or mass lesion/mass effect. Bilateral remote basal ganglia lacunar infarcts. There is moderate diffuse low-attenuation within the subcortical and periventricular white matter compatible with chronic microvascular disease. Increased volume of the sulci and ventricles compatible with atrophy. Vascular: No hyperdense vessel or unexpected calcification. Skull: Negative for acute fracture or focal lesion. Remote floor of left orbit fracture. Sinuses/Orbits: No acute abnormality. Other: Mild contusion along the right frontoparietal scalp without underlying skull fracture, image 22/4. CT CERVICAL SPINE FINDINGS Alignment: No acute posttraumatic malalignment of the cervical spine. As noted on the previous exam there is 1-2 mm anterolisthesis of C3 on C4 and 3 mm anterolisthesis of C4 on C5. Skull base and vertebrae: No acute fracture or subluxation. Soft tissues and spinal canal: No prevertebral fluid or swelling. No visible canal hematoma. Disc levels: Multilevel disc space narrowing with ventral endplate spurring. This is most severe at  the C5-6 and C6-7 levels. Bilateral facet arthropathy noted. Upper chest: Negative. Other: Bilateral carotid artery calcifications. IMPRESSION: 1. No acute intracranial abnormalities. 2. Mild contusion along the right frontoparietal scalp without underlying skull fracture. 3. Chronic microvascular disease and atrophy. 4. No evidence for cervical spine fracture or subluxation. 5. 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 Electronically Signed   By: Waddell Calk M.D.   On: 12/18/2023 10:23   CT Cervical Spine Wo Contrast Result Date: 12/18/2023 CLINICAL DATA:  Head trauma. Un witnessed fall. Small laceration to right side of the head. EXAM: CT HEAD WITHOUT CONTRAST CT CERVICAL SPINE WITHOUT CONTRAST TECHNIQUE: Multidetector CT imaging of the head and cervical spine was performed following the standard protocol without intravenous contrast. Multiplanar CT image reconstructions of the cervical spine were also generated. RADIATION DOSE REDUCTION: This exam was performed according to the departmental dose-optimization program which includes automated exposure control, adjustment of the mA and/or kV according to patient size and/or use of iterative reconstruction technique. COMPARISON:  04/15/2023 FINDINGS: CT HEAD FINDINGS Brain: No evidence of acute infarction, hemorrhage, hydrocephalus, extra-axial collection or mass lesion/mass effect. Bilateral remote basal ganglia  lacunar infarcts. There is moderate diffuse low-attenuation within the subcortical and periventricular white matter compatible with chronic microvascular disease. Increased volume of the sulci and ventricles compatible with atrophy. Vascular: No hyperdense vessel or unexpected calcification. Skull: Negative for acute fracture or focal lesion. Remote floor of left orbit fracture. Sinuses/Orbits: No acute abnormality. Other: Mild contusion along the right frontoparietal scalp without underlying skull fracture, image 22/4. CT  CERVICAL SPINE FINDINGS Alignment: No acute posttraumatic malalignment of the cervical spine. As noted on the previous exam there is 1-2 mm anterolisthesis of C3 on C4 and 3 mm anterolisthesis of C4 on C5. Skull base and vertebrae: No acute fracture or subluxation. Soft tissues and spinal canal: No prevertebral fluid or swelling. No visible canal hematoma. Disc levels: Multilevel disc space narrowing with ventral endplate spurring. This is most severe at the C5-6 and C6-7 levels. Bilateral facet arthropathy noted. Upper chest: Negative. Other: Bilateral carotid artery calcifications. IMPRESSION: 1. No acute intracranial abnormalities. 2. Mild contusion along the right frontoparietal scalp without underlying skull fracture. 3. Chronic microvascular disease and atrophy. 4. No evidence for cervical spine fracture or subluxation. 5. 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 Electronically Signed   By: Waddell Calk M.D.   On: 12/18/2023 10:23   DG Shoulder Right Result Date: 12/18/2023 CLINICAL DATA:  Unwitnessed fall. EXAM: RIGHT SHOULDER - 2+ VIEW COMPARISON:  None Available. FINDINGS: Acute fracture deformity is seen involving the greater tubercle and surgical neck of the proximal right humerus. There is no evidence of dislocation. Mild degenerative changes are seen involving the right acromioclavicular joint and right glenohumeral articulation. Soft tissues are unremarkable. IMPRESSION: Acute fracture of the proximal right humerus. Electronically Signed   By: Suzen Dials M.D.   On: 12/18/2023 09:38   DG Shoulder Left Result Date: 12/18/2023 CLINICAL DATA:  Unwitnessed fall. EXAM: LEFT SHOULDER - 2+ VIEW COMPARISON:  None Available. FINDINGS: There is no evidence of fracture or dislocation. Mild degenerative changes seen involving the left acromioclavicular joint and left glenohumeral articulation. Soft tissues are unremarkable. IMPRESSION: Mild degenerative changes without  evidence of an acute osseous abnormality. Electronically Signed   By: Suzen Dials M.D.   On: 12/18/2023 09:37   DG Elbow Complete Right Result Date: 12/18/2023 CLINICAL DATA:  Status post fall. EXAM: RIGHT ELBOW - COMPLETE 3+ VIEW COMPARISON:  None Available. FINDINGS: There is no evidence of fracture, dislocation, or joint effusion. Mild chronic changes are seen involving the medial and lateral condyles of the distal right humerus. Soft tissues are unremarkable. IMPRESSION: 1. No acute osseous abnormality. Electronically Signed   By: Suzen Dials M.D.   On: 12/18/2023 09:35   DG Elbow Complete Left Result Date: 12/18/2023 CLINICAL DATA:  Unwitnessed fall. EXAM: LEFT ELBOW - COMPLETE 3+ VIEW COMPARISON:  None Available. FINDINGS: There is no evidence of fracture, dislocation, or joint effusion. There is no evidence of arthropathy or other focal bone abnormality. Soft tissues are unremarkable. IMPRESSION: Negative. Electronically Signed   By: Suzen Dials M.D.   On: 12/18/2023 09:34   DG Pelvis 1-2 Views Result Date: 12/18/2023 CLINICAL DATA:  Status post fall. EXAM: PELVIS - 1-2 VIEW COMPARISON:  None Available. FINDINGS: There is no evidence of pelvic fracture or diastasis. No pelvic bone lesions are seen. IMPRESSION: Negative. Electronically Signed   By: Suzen Dials M.D.   On: 12/18/2023 09:33    Positive ROS: All other systems have been reviewed and were otherwise negative with the exception of those mentioned in  the HPI and as above.  Physical Exam: General:  Alert, no acute distress Psychiatric:  Patient is alert and follows simple commands not competent for consent Cardiovascular:  No pedal edema Respiratory:  No wheezing, non-labored breathing GI:  Abdomen is soft and non-tender Skin:  No lesions in the area of chief complaint Neurologic:  Sensation intact distally Lymphatic:  No axillary or cervical lymphadenopathy  Orthopedic Exam:  Right upper extremity Skin  intact over the shoulder well-dressed skin tear over the right wrist no active bleeding noted Tender to palpation of the proximal humerus no tenderness over the elbow distal radius finger hands Pain with any motion of the arm localized to the shoulder Able to form a composite grasp and squeeze my fingers no focal neurodeficits in the distal right upper extremity Risk capillary refill and intact radial pulse  Secondary survey No tenderness to palpation over other bony prominences in the lower extremities or left upper extremities No pain with logroll or simulated axial loading of the bilateral lower extremity All compartments soft No tenderness to palpation over the cervical or thoracic spine, no bony step-off Motor grossly intact throughout, no focal deficits Sensation grossly intact throughout, no focal deficits Good distal pulses and capillary refill on all extremities  X-rays:  X-rays reviewed full reports above show degenerative changes and diffuse osteopenia across both shoulders both elbows and the right hand.  There is an acute appearing impacted humeral neck fracture on the right with a greater tuberosity fracture with minimal displacement.  No other fractures noted.  Assessment: Right proximal humerus fracture  Plan: I reviewed the clinical and radiograph findings with the patient and her daughters and discussed treatment options.  She has an effectively nondisplaced impacted right proximal humerus fracture.  She normally ambulates without any assistive devices so we discussed potential for conservative treatment.  Patient is already placed in a sling and advised continued use of the sling and nonweightbearing of right upper extremity to begin gentle exercises with physical therapy over the next few weeks.  We discussed the risk of nonunion, malunion, potential for fracture displacement or potential future surgical intervention should it become necessary.  At this time the patient and  family agree with the plan to move forward with conservative treatment for the right proximal humerus fracture.  She can follow-up in the office in 10 to 14 days for repeat x-rays and maintain nonweightbearing the right upper extremity with a sling for comfort.  All questions answered.   Arthea Sheer MD  Beeper #:  [phone]  12/18/2023 1:29 PM

## 2023-12-18 NOTE — ED Triage Notes (Signed)
 Pt in from [facility] care, found on the floor with a small lac to head and RT finger. Pt also has right shoulder pain. Pt non-verbal.    BP 146/70 99% RA 56 HR

## 2023-12-18 NOTE — ED Triage Notes (Signed)
 Pt arrived via [facility] from [facility] after an unwitnessed fall this morning with a c/o a small head lac on the right side of their head, their right pinky finger. Pt is also holding their right shoulder. Pt non-verbal and per EMS pt walks with assistance at the [facility].

## 2023-12-18 NOTE — Plan of Care (Signed)

## 2023-12-18 NOTE — H&P (Signed)
 History and Physical    Pam Keller FMW:[id number] DOB: [birth year] DOA: 12/18/2023  PCP: Pia Kerney SQUIBB, MD  Patient coming from:  SNF  I have personally briefly reviewed patient's old medical records in [facility]  Chief Complaint:  confusion weakness, unwitnessed fall  HPI: Pam Keller is a 83 y.o. female with medical history significant of  HLD, HTN , Dementia, basal cell carcinoma, who presents for SNF after unwitnessed fall where she sustained head injury as well as right arm injury.  Per  chart patient had not been herself over the last few days more confused from baseline and also noted to be weak. Per daughter at baseline patient is very active but only oriented to self.  ED Course:  IN ED patient was found to have UTI and right humerus fracture.  Patient is admitted for treatment of UTI.   Vitals: Afeb  bp 144/71, hr 64, rr 16, sat 100%  UA +wbc >50, +bacteria  ZXH:ennm quality  CK 94, lipase 38  CE 4,7 CTH/Cervical spine IMPRESSION: 1. No acute intracranial abnormalities. 2. Mild contusion along the right frontoparietal scalp without underlying skull fracture. 3. Chronic microvascular disease and atrophy. 4. No evidence for cervical spine fracture or subluxation. 5. 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  Ctthoracic spine MPRESSION: 1. Mild downward bowing deformity of the superior endplate of T4, likely chronic. 2. 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   Right arm xray IMPRESSION: Acute fracture of the proximal right humerus.    Review of Systems: As per HPI otherwise 10 point review of systems negative.   Past Medical History:  Diagnosis Date   Basal cell carcinoma    Dementia (HCC)    Hyperlipidemia    Hypertension    Microscopic hematuria 06/24/2008   negative workuo   Pulmonary nodule     Past Surgical History:  Procedure Laterality Date   ABDOMINAL HYSTERECTOMY  1984    menorrhagia   APPENDECTOMY  1955   for rupture appx   BREAST BIOPSY     benign   CARPAL TUNNEL RELEASE     FOOT SURGERY     macular surgery     [facility], repaired   MOHS SURGERY  2012   for basal cell, forehead   OOPHORECTOMY  1960   TRIGGER FINGER RELEASE  2004   [facility]     reports that she quit smoking about 16 years ago. Her smoking use included cigarettes. She started smoking about 64 years ago. She has a 24 pack-year smoking history. She has never used smokeless tobacco. She reports current alcohol use. She reports that she does not use drugs.  Allergies  Allergen Reactions   Meperidine Other (See Comments)    Other Reaction: GI Upset Other reaction(s): Other (See Comments) Other reaction(s): Other (See Comments) Other Reaction: GI Upset Other Reaction: GI Upset   Clindamycin    Clindamycin/Lincomycin    Demerol    Sulfa Antibiotics    Codeine     Other reaction(s): Other (See Comments) Other Reaction: GI Upset   Penicillins     Other reaction(s): Other (See Comments) Other Reaction: GI Upset    Family History  Problem Relation Age of Onset   Hypertension Father    Cancer Neg Hx     Prior to Admission medications   Medication Sig Start Date End Date Taking? Authorizing Provider  amLODipine  (NORVASC ) 10 MG tablet Take 10 mg by mouth daily. 01/05/20   [provider]  amLODipine  (NORVASC ) 5 MG tablet TAKE 1 TABLET (5 MG TOTAL) BY MOUTH DAILY. 08/14/15   Marylynn Verneita CROME, MD  aspirin 325 MG tablet Take 325 mg by mouth daily.      [provider]  atorvastatin (LIPITOR) 10 MG tablet Take 10 mg by mouth daily. 02/04/20   [provider]  busPIRone (BUSPAR) 10 MG tablet Take 10 mg by mouth 2 (two) times daily. 02/04/20   [provider]  cholecalciferol (VITAMIN D ) 25 MCG (1000 UNIT) tablet Take by mouth. 12/15/19   [provider]  donepezil  (ARICEPT ) 5 MG tablet TAKE 1 TABLET (5 MG TOTAL) BY MOUTH AT BEDTIME. 05/27/16   Bertrum Charlie CROME, MD  Ferrous Sulfate (IRON) 325 (65 Fe) MG TABS Take by mouth. 12/15/19   [provider]  furosemide (LASIX) 20 MG tablet Take 20 mg by mouth 3 (three) times daily. 02/05/20   [provider]  furosemide (LASIX) 40 MG tablet Take 40 mg by mouth daily. 01/25/20   [provider]  hydrocortisone (ANUSOL-HC) 2.5 % rectal cream Apply topically. 09/16/08   [provider]  LORazepam (ATIVAN) 0.5 MG tablet Take by mouth. 02/04/20   [provider]  losartan  (COZAAR ) 100 MG tablet Take 100 mg by mouth daily. 02/04/20   [provider]  losartan  (COZAAR ) 25 MG tablet Take 1 tablet by mouth daily. 11/09/10   [provider]  melatonin 3 MG TABS tablet Take by mouth. 12/15/19   [provider]  metoprolol succinate (TOPROL-XL) 50 MG 24 hr tablet Take 50 mg by mouth daily. 02/04/20   [provider]  Multiple Vitamin (MULTIVITAMIN ADULT PO) Take 1 tablet by mouth daily. 02/16/07   [provider]  potassium chloride  (MICRO-K ) 10 MEQ CR capsule Take by mouth 2 (two) times daily. 02/04/20   [provider]  sertraline (ZOLOFT) 50 MG tablet Take 50 mg by mouth daily.    [provider]  Skin Protectants, Misc. (MINERIN CREME) CREA  12/15/19   [provider]    Physical Exam: Vitals:   12/18/23 0739 12/18/23 0747 12/18/23 1210  BP: (!) 144/71  (!) 140/68  Pulse: 64  60  Resp: 16  18  Temp: 98.4 F (36.9 C)  98.2 F (36.8 C)  TempSrc: Oral  Oral  SpO2: 100%  100%  Weight:  55 kg   Height:  5' 3 (1.6 m)     Constitutional: NAD, calm, sleeping Vitals:   12/18/23 0739 12/18/23 0747 12/18/23 1210  BP: (!) 144/71  (!) 140/68  Pulse: 64  60  Resp: 16  18  Temp: 98.4 F (36.9 C)  98.2 F (36.8 C)  TempSrc: Oral  Oral  SpO2: 100%  100%  Weight:  55 kg   Height:  5' 3 (1.6 m)    Eyes: lids and conjunctivae normal ENMT: Mucous membranes are dry  Neck: normal, supple, no masses,  no thyromegaly Respiratory: clear to auscultation bilaterally, no wheezing, no crackles. Normal respiratory effort. No accessory muscle use.  Cardiovascular: Regular rate and rhythm, no murmurs / rubs / gallops. No extremity edema. 2+ pedal pulses.  Abdomen: no tenderness, no masses palpated. No hepatosplenomegaly. Bowel sounds positive.  Musculoskeletal: no clubbing / cyanosis. Upper right extremity in slign. Good ROM, no contractures. Normal muscle tone.  Skin: no rashes, lesions, ulcers. No induration Neurologic: CN grossly intact. Sensation intact, MAE x 4 Psychiatric: Normal judgment and insight. Alert and oriented x 3. Normal  mood.    Labs on Admission: I have personally reviewed following labs and imaging studies  CBC: Recent Labs  Lab 12/18/23 0931  WBC 10.0  NEUTROABS 8.3*  HGB 10.7*  HCT 33.5*  MCV 90.8  PLT 237   Basic Metabolic Panel: Recent Labs  Lab 12/18/23 0931  NA 141  K 3.7  CL 105  CO2 27  GLUCOSE 114*  BUN 32*  CREATININE 1.21*  CALCIUM 9.7   GFR: Estimated Creatinine Clearance: 28.6 mL/min (A) (by C-G formula based on SCr of 1.21 mg/dL (H)). Liver Function Tests: Recent Labs  Lab 12/18/23 0931  AST 21  ALT 13  ALKPHOS 63  BILITOT 0.7  PROT 6.9  ALBUMIN 3.8   Recent Labs  Lab 12/18/23 0931  LIPASE 38   No results for input(s): AMMONIA in the last 168 hours. Coagulation Profile: No results for input(s): INR, PROTIME in the last 168 hours. Cardiac Enzymes: Recent Labs  Lab 12/18/23 0931  CKTOTAL 94   BNP (last 3 results) No results for input(s): PROBNP in the last 8760 hours. HbA1C: No results for input(s): HGBA1C in the last 72 hours. CBG: No results for input(s): GLUCAP in the last 168 hours. Lipid Profile: No results for input(s): CHOL, HDL, LDLCALC, TRIG, CHOLHDL, LDLDIRECT in the last 72 hours. Thyroid  Function Tests: No results for input(s): TSH, T4TOTAL, FREET4, T3FREE, THYROIDAB in the  last 72 hours. Anemia Panel: No results for input(s): VITAMINB12, FOLATE, FERRITIN, TIBC, IRON, RETICCTPCT in the last 72 hours. Urine analysis:    Component Value Date/Time   COLORURINE YELLOW (A) 12/18/2023 0810   APPEARANCEUR CLOUDY (A) 12/18/2023 0810   LABSPEC 1.012 12/18/2023 0810   PHURINE 7.0 12/18/2023 0810   GLUCOSEU NEGATIVE 12/18/2023 0810   HGBUR NEGATIVE 12/18/2023 0810   BILIRUBINUR NEGATIVE 12/18/2023 0810   BILIRUBINUR neg 03/02/2014 1700   KETONESUR NEGATIVE 12/18/2023 0810   PROTEINUR NEGATIVE 12/18/2023 0810   UROBILINOGEN negative 03/02/2014 1700   NITRITE NEGATIVE 12/18/2023 0810   LEUKOCYTESUR LARGE (A) 12/18/2023 0810    Radiological Exams on Admission: DG Hand Complete Right Result Date: 12/18/2023 CLINICAL DATA:  fall, skin tear to right hand EXAM: RIGHT HAND - COMPLETE 3+ VIEW COMPARISON:  None Available. FINDINGS: Diffuse osteopenia.No acute fracture or dislocation. Severe joint space loss at the base of the thumb. Mild joint space loss of the DIP of the first and second digits. No radiopaque foreign body. IMPRESSION: 1. Diffuse osteopenia.  No acute fracture or dislocation. 2. 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 Electronically Signed   By: Rogelia Myers M.D.   On: 12/18/2023 11:52   CT Thoracic Spine Wo Contrast Result Date: 12/18/2023 CLINICAL DATA:  Back trauma, no prior imaging (Age >= 16y) EXAM: CT THORACIC SPINE WITHOUT CONTRAST TECHNIQUE: Multidetector CT images of the thoracic were obtained using the standard protocol without intravenous contrast. RADIATION DOSE REDUCTION: This exam was performed according to the departmental dose-optimization program which includes automated exposure control, adjustment of the mA and/or kV according to patient size and/or use of iterative reconstruction technique. COMPARISON:  None Available. FINDINGS: Alignment: Mild dextroscoliosis. Slight degenerative anterolisthesis at C7-T1, T2-3 and  T3-4. Vertebrae: Mild done written bone deformity of the superior endplate of T4, of uncertain chronicity. There is mild sclerosis of the endplate favoring chronicity. No apparent acute fracture. No osseous lesions are present. Paraspinal and other soft tissues: No paraspinous or epidural hematoma and no soft tissue swelling. Mild fusiform aneurysmal dilatation of the descending thoracic  aorta, which measures up to 4.4 x 4.0 cm in cross-sectional diameter. The ascending thoracic aorta is mildly ectatic measuring up to 3.4 cm in diameter. There is mild mosaic attenuation of the lungs. There are hazy and reticular opacities present dependently within the lower lobes bilaterally. There is also mild bronchiolectasis. Disc levels: There is disc space narrowing and endplate ridging present diffusely throughout the thoracic spine. There is no significant disc herniation. There is also no significant spinal canal or neural foraminal stenosis evident. IMPRESSION: 1. Mild downward bowing deformity of the superior endplate of T4, likely chronic. 2. 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 Electronically Signed   By: Evalene Coho M.D.   On: 12/18/2023 10:46   CT Maxillofacial WO CM Result Date: 12/18/2023 CLINICAL DATA:  Facial trauma, blunt EXAM: CT MAXILLOFACIAL WITHOUT CONTRAST TECHNIQUE: Multidetector CT imaging of the maxillofacial structures was performed. Multiplanar CT image reconstructions were also generated. RADIATION DOSE REDUCTION: This exam was performed according to the departmental dose-optimization program which includes automated exposure control, adjustment of the mA and/or kV according to patient size and/or use of iterative reconstruction technique. COMPARISON:  Maxillofacial CT dated December 11, 2019. FINDINGS: Osseous: Chronic blowout fracture of the left orbital floor. No acute facial fracture. No osseous lesions. Numerous dental caries. Moderate bilateral cervical facet arthrosis.  Orbits: Mild left enophthalmos secondary to orbital floor fracture. There is orbital fat and the inferior rectus muscle partially protruding through the defect. Status post bilateral lens replacement. Sinuses: Mild polypoid mucosal disease within the floor the left maxillary sinus. Soft tissues: No significant soft tissue swelling evident. Limited intracranial: Moderate cerebral atrophy. Chronic lacunar infarct within the right basal ganglia. IMPRESSION: 1. No evidence of acute traumatic injury. 2. Chronic blowout fracture of the left inferior orbital wall with associated enophthalmos. Electronically Signed   By: Evalene Coho M.D.   On: 12/18/2023 10:36   CT Lumbar Spine Wo Contrast Result Date: 12/18/2023 CLINICAL DATA:  Back trauma, no prior imaging (Age >= 16y) EXAM: CT LUMBAR SPINE WITHOUT CONTRAST TECHNIQUE: Multidetector CT imaging of the lumbar spine was performed without intravenous contrast administration. Multiplanar CT image reconstructions were also generated. RADIATION DOSE REDUCTION: This exam was performed according to the departmental dose-optimization program which includes automated exposure control, adjustment of the mA and/or kV according to patient size and/or use of iterative reconstruction technique. COMPARISON:  None Available. FINDINGS: Segmentation: 5 lumbar type vertebrae. Alignment: Slight degenerative retrolisthesis at L2-3 and also slight degenerative anterolisthesis at L4-5. Vertebrae: Vertebral bodies maintain their height. No evidence of fracture or osseous lesion. Paraspinal and other soft tissues: No paraspinous hematoma or soft tissue injury evident. There are simple appearing exophytic cyst arising from the kidneys bilaterally. Moderate atheromatous disease within the abdominal aorta and mild ectasia. Disc levels: L1-2: Minimal disc bulging. No significant spinal canal or neural foraminal stenosis. L2-3: Mild degenerative retrolisthesis. Diffuse posterior disc bulging and  moderate bilateral facet arthrosis, resulting in moderate central spinal canal stenosis and bilateral lateral recess stenosis, with questionable impingement of the L3 nerves in the lateral recesses. L3-4: Chronic degenerative disc disease and bilateral facet arthrosis, with moderate to severe central spinal canal stenosis and bilateral lateral recess stenosis. There is likely impingement of the L4 nerves in the lateral recesses. L4-5: Degenerative anterolisthesis with mild diffuse disc bulging and moderate bilateral facet arthrosis. There is thickening and calcification of the ligamentum flavum and moderate to severe central spinal canal stenosis and bilateral lateral recess stenosis. There is likely impingement of  the L5 nerves in the lateral recesses. L5-S1: Right posterolateral disc protrusion and diffuse endplate ridging. There is also moderate bilateral facet arthrosis. Moderate central spinal canal stenosis and moderate to severe bilateral neural foraminal stenosis. IMPRESSION: 1. No evidence of acute traumatic injury. 2. Multilevel degenerative disc disease and facet arthrosis with varying central spinal canal stenosis and lateral recess stenosis with likely impingement of the transiting nerves in the lateral recesses at L2-3, L3-4 and L4-5. Electronically Signed   By: Evalene Coho M.D.   On: 12/18/2023 10:29   CT Head Wo Contrast Result Date: 12/18/2023 CLINICAL DATA:  Head trauma. Un witnessed fall. Small laceration to right side of the head. EXAM: CT HEAD WITHOUT CONTRAST CT CERVICAL SPINE WITHOUT CONTRAST TECHNIQUE: Multidetector CT imaging of the head and cervical spine was performed following the standard protocol without intravenous contrast. Multiplanar CT image reconstructions of the cervical spine were also generated. RADIATION DOSE REDUCTION: This exam was performed according to the departmental dose-optimization program which includes automated exposure control, adjustment of the mA and/or  kV according to patient size and/or use of iterative reconstruction technique. COMPARISON:  04/15/2023 FINDINGS: CT HEAD FINDINGS Brain: No evidence of acute infarction, hemorrhage, hydrocephalus, extra-axial collection or mass lesion/mass effect. Bilateral remote basal ganglia lacunar infarcts. There is moderate diffuse low-attenuation within the subcortical and periventricular white matter compatible with chronic microvascular disease. Increased volume of the sulci and ventricles compatible with atrophy. Vascular: No hyperdense vessel or unexpected calcification. Skull: Negative for acute fracture or focal lesion. Remote floor of left orbit fracture. Sinuses/Orbits: No acute abnormality. Other: Mild contusion along the right frontoparietal scalp without underlying skull fracture, image 22/4. CT CERVICAL SPINE FINDINGS Alignment: No acute posttraumatic malalignment of the cervical spine. As noted on the previous exam there is 1-2 mm anterolisthesis of C3 on C4 and 3 mm anterolisthesis of C4 on C5. Skull base and vertebrae: No acute fracture or subluxation. Soft tissues and spinal canal: No prevertebral fluid or swelling. No visible canal hematoma. Disc levels: Multilevel disc space narrowing with ventral endplate spurring. This is most severe at the C5-6 and C6-7 levels. Bilateral facet arthropathy noted. Upper chest: Negative. Other: Bilateral carotid artery calcifications. IMPRESSION: 1. No acute intracranial abnormalities. 2. Mild contusion along the right frontoparietal scalp without underlying skull fracture. 3. Chronic microvascular disease and atrophy. 4. No evidence for cervical spine fracture or subluxation. 5. 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 Electronically Signed   By: Waddell Calk M.D.   On: 12/18/2023 10:23   CT Cervical Spine Wo Contrast Result Date: 12/18/2023 CLINICAL DATA:  Head trauma. Un witnessed fall. Small laceration to right side of the head. EXAM: CT  HEAD WITHOUT CONTRAST CT CERVICAL SPINE WITHOUT CONTRAST TECHNIQUE: Multidetector CT imaging of the head and cervical spine was performed following the standard protocol without intravenous contrast. Multiplanar CT image reconstructions of the cervical spine were also generated. RADIATION DOSE REDUCTION: This exam was performed according to the departmental dose-optimization program which includes automated exposure control, adjustment of the mA and/or kV according to patient size and/or use of iterative reconstruction technique. COMPARISON:  04/15/2023 FINDINGS: CT HEAD FINDINGS Brain: No evidence of acute infarction, hemorrhage, hydrocephalus, extra-axial collection or mass lesion/mass effect. Bilateral remote basal ganglia lacunar infarcts. There is moderate diffuse low-attenuation within the subcortical and periventricular white matter compatible with chronic microvascular disease. Increased volume of the sulci and ventricles compatible with atrophy. Vascular: No hyperdense vessel or unexpected calcification. Skull: Negative for acute fracture or  focal lesion. Remote floor of left orbit fracture. Sinuses/Orbits: No acute abnormality. Other: Mild contusion along the right frontoparietal scalp without underlying skull fracture, image 22/4. CT CERVICAL SPINE FINDINGS Alignment: No acute posttraumatic malalignment of the cervical spine. As noted on the previous exam there is 1-2 mm anterolisthesis of C3 on C4 and 3 mm anterolisthesis of C4 on C5. Skull base and vertebrae: No acute fracture or subluxation. Soft tissues and spinal canal: No prevertebral fluid or swelling. No visible canal hematoma. Disc levels: Multilevel disc space narrowing with ventral endplate spurring. This is most severe at the C5-6 and C6-7 levels. Bilateral facet arthropathy noted. Upper chest: Negative. Other: Bilateral carotid artery calcifications. IMPRESSION: 1. No acute intracranial abnormalities. 2. Mild contusion along the right  frontoparietal scalp without underlying skull fracture. 3. Chronic microvascular disease and atrophy. 4. No evidence for cervical spine fracture or subluxation. 5. 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 Electronically Signed   By: Waddell Calk M.D.   On: 12/18/2023 10:23   DG Shoulder Right Result Date: 12/18/2023 CLINICAL DATA:  Unwitnessed fall. EXAM: RIGHT SHOULDER - 2+ VIEW COMPARISON:  None Available. FINDINGS: Acute fracture deformity is seen involving the greater tubercle and surgical neck of the proximal right humerus. There is no evidence of dislocation. Mild degenerative changes are seen involving the right acromioclavicular joint and right glenohumeral articulation. Soft tissues are unremarkable. IMPRESSION: Acute fracture of the proximal right humerus. Electronically Signed   By: Suzen Dials M.D.   On: 12/18/2023 09:38   DG Shoulder Left Result Date: 12/18/2023 CLINICAL DATA:  Unwitnessed fall. EXAM: LEFT SHOULDER - 2+ VIEW COMPARISON:  None Available. FINDINGS: There is no evidence of fracture or dislocation. Mild degenerative changes seen involving the left acromioclavicular joint and left glenohumeral articulation. Soft tissues are unremarkable. IMPRESSION: Mild degenerative changes without evidence of an acute osseous abnormality. Electronically Signed   By: Suzen Dials M.D.   On: 12/18/2023 09:37   DG Elbow Complete Right Result Date: 12/18/2023 CLINICAL DATA:  Status post fall. EXAM: RIGHT ELBOW - COMPLETE 3+ VIEW COMPARISON:  None Available. FINDINGS: There is no evidence of fracture, dislocation, or joint effusion. Mild chronic changes are seen involving the medial and lateral condyles of the distal right humerus. Soft tissues are unremarkable. IMPRESSION: 1. No acute osseous abnormality. Electronically Signed   By: Suzen Dials M.D.   On: 12/18/2023 09:35   DG Elbow Complete Left Result Date: 12/18/2023 CLINICAL DATA:  Unwitnessed  fall. EXAM: LEFT ELBOW - COMPLETE 3+ VIEW COMPARISON:  None Available. FINDINGS: There is no evidence of fracture, dislocation, or joint effusion. There is no evidence of arthropathy or other focal bone abnormality. Soft tissues are unremarkable. IMPRESSION: Negative. Electronically Signed   By: Suzen Dials M.D.   On: 12/18/2023 09:34   DG Pelvis 1-2 Views Result Date: 12/18/2023 CLINICAL DATA:  Status post fall. EXAM: PELVIS - 1-2 VIEW COMPARISON:  None Available. FINDINGS: There is no evidence of pelvic fracture or diastasis. No pelvic bone lesions are seen. IMPRESSION: Negative. Electronically Signed   By: Suzen Dials M.D.   On: 12/18/2023 09:33    EKG: Independently reviewed. Poor study repeat pending   Assessment/Plan   UTI -admit to med tele  -continue with CTX  -f/u on urine culture   Encephalopathy -due to UTI -treat underlying cause   Right Humerus fracture s/p fall  -immobilize extremity - supportive care with pain medication  - ortho consulted rec conservative care and f/u outpatient  HLD -continue on atorvastatin    HTN  -resume metoprolol as bp tolerates    Dementia -no active issue     DVT prophylaxis: heparin  Code Status: full/ as discussed with  patient daughters in event of cardiac arrest  Family Communication: continued use of the sling and nonweightbearing of right upper extremity to begin gentle exercises with physical therapy over the next few weeks.  Disposition Plan: patient  expected to be admitted greater than 2 midnights  Consults called:  ortho [place] Admission status: SDU   Camila DELENA Ned MD [facility]   If 7PM-7AM, please contact night-coverage [email or web address] Password [email or web address]  12/18/2023, 12:20 PM

## 2023-12-18 NOTE — ED Notes (Signed)
 Family at bedside.

## 2023-12-19 DIAGNOSIS — S42201A Unspecified fracture of upper end of right humerus, initial encounter for closed fracture: Secondary | ICD-10-CM | POA: Diagnosis not present

## 2023-12-19 DIAGNOSIS — N3000 Acute cystitis without hematuria: Secondary | ICD-10-CM | POA: Diagnosis not present

## 2023-12-19 LAB — COMPREHENSIVE METABOLIC PANEL WITH GFR
ALT: 12 U/L (ref 0–44)
AST: 22 U/L (ref 15–41)
Albumin: 3.7 g/dL (ref 3.5–5.0)
Alkaline Phosphatase: 60 U/L (ref 38–126)
Anion gap: 11 (ref 5–15)
BUN: 27 mg/dL — ABNORMAL HIGH (ref 8–23)
CO2: 24 mmol/L (ref 22–32)
Calcium: 9.4 mg/dL (ref 8.9–10.3)
Chloride: 108 mmol/L (ref 98–111)
Creatinine, Ser: 1.06 mg/dL — ABNORMAL HIGH (ref 0.44–1.00)
GFR, Estimated: 52 mL/min — ABNORMAL LOW (ref >60)
Glucose, Bld: 97 mg/dL (ref 70–99)
Potassium: 3.7 mmol/L (ref 3.5–5.1)
Sodium: 143 mmol/L (ref 135–145)
Total Bilirubin: 1 mg/dL (ref 0.0–1.2)
Total Protein: 6.8 g/dL (ref 6.5–8.1)

## 2023-12-19 LAB — CBC
HCT: 33 % — ABNORMAL LOW (ref 36.0–46.0)
Hemoglobin: 10.5 g/dL — ABNORMAL LOW (ref 12.0–15.0)
MCH: 28.5 pg (ref 26.0–34.0)
MCHC: 31.8 g/dL (ref 30.0–36.0)
MCV: 89.4 fL (ref 80.0–100.0)
Platelets: 200 10*3/uL (ref 150–400)
RBC: 3.69 MIL/uL — ABNORMAL LOW (ref 3.87–5.11)
RDW: 14.7 % (ref 11.5–15.5)
WBC: 6.9 10*3/uL (ref 4.0–10.5)
nRBC: 0 % (ref 0.0–0.2)

## 2023-12-19 MED ORDER — LACTULOSE 10 GM/15ML PO SOLN
10.0000 g | Freq: Three times a day (TID) | ORAL | Status: DC
  Administered 2023-12-21 (×2): 10 g via ORAL
  Filled 2023-12-19 (×4): qty 30

## 2023-12-19 MED ORDER — DONEPEZIL HCL 5 MG PO TABS
5.0000 mg | ORAL_TABLET | Freq: Every day | ORAL | Status: DC
  Administered 2023-12-19 – 2023-12-20 (×2): 5 mg via ORAL
  Filled 2023-12-19 (×2): qty 1

## 2023-12-19 MED ORDER — TRAZODONE HCL 50 MG PO TABS
25.0000 mg | ORAL_TABLET | Freq: Every day | ORAL | Status: DC
  Administered 2023-12-19 – 2023-12-20 (×2): 25 mg via ORAL
  Filled 2023-12-19 (×2): qty 1

## 2023-12-19 MED ORDER — SODIUM CHLORIDE 0.9 % IV SOLN
1.0000 g | INTRAVENOUS | Status: DC
  Administered 2023-12-20 – 2023-12-21 (×2): 1 g via INTRAVENOUS
  Filled 2023-12-19 (×2): qty 10

## 2023-12-19 MED ORDER — ASPIRIN 81 MG PO CHEW
325.0000 mg | CHEWABLE_TABLET | Freq: Every day | ORAL | Status: DC
  Administered 2023-12-19: 324 mg via ORAL
  Administered 2023-12-20 – 2023-12-21 (×2): 325 mg via ORAL
  Filled 2023-12-19 (×3): qty 5

## 2023-12-19 MED ORDER — SERTRALINE HCL 50 MG PO TABS
50.0000 mg | ORAL_TABLET | Freq: Every day | ORAL | Status: DC
  Administered 2023-12-19 – 2023-12-21 (×3): 50 mg via ORAL
  Filled 2023-12-19 (×3): qty 1

## 2023-12-19 MED ORDER — BUSPIRONE HCL 10 MG PO TABS
10.0000 mg | ORAL_TABLET | Freq: Two times a day (BID) | ORAL | Status: DC
  Administered 2023-12-19 – 2023-12-21 (×4): 10 mg via ORAL
  Filled 2023-12-19 (×4): qty 1

## 2023-12-19 MED ORDER — LOSARTAN POTASSIUM 50 MG PO TABS
100.0000 mg | ORAL_TABLET | Freq: Every day | ORAL | Status: DC
  Administered 2023-12-19 – 2023-12-21 (×3): 100 mg via ORAL
  Filled 2023-12-19 (×3): qty 2

## 2023-12-19 MED ORDER — ATORVASTATIN CALCIUM 20 MG PO TABS
40.0000 mg | ORAL_TABLET | Freq: Every day | ORAL | Status: DC
  Administered 2023-12-19 – 2023-12-21 (×3): 40 mg via ORAL
  Filled 2023-12-19 (×3): qty 2

## 2023-12-19 MED ORDER — METOPROLOL SUCCINATE ER 50 MG PO TB24
50.0000 mg | ORAL_TABLET | Freq: Every day | ORAL | Status: DC
  Administered 2023-12-19 – 2023-12-21 (×2): 50 mg via ORAL
  Filled 2023-12-19 (×2): qty 1

## 2023-12-19 MED ORDER — HYDRALAZINE HCL 50 MG PO TABS
25.0000 mg | ORAL_TABLET | Freq: Two times a day (BID) | ORAL | Status: DC
  Administered 2023-12-19 – 2023-12-21 (×4): 25 mg via ORAL
  Filled 2023-12-19 (×4): qty 1

## 2023-12-19 NOTE — Progress Notes (Signed)
 Per Dr Marquette Sites, dc tele monitoring

## 2023-12-19 NOTE — Hospital Course (Addendum)
 Patient received trauma scans on arrival.  She had 11 separate scans.  Scans revealed: Bilateral remote basal ganglia lacunar infarcts.  Remote floor of left orbit fracture.  Multilevel disc space narrowing most severe at C5-C6 and C6-7.  Contusion of right frontal parietal scalp without underlying skull fracture.  Simple appearing exophytic cyst arising from the kidneys bilaterally, moderate atherosclerotic disease in the abdominal aorta and mild ectasia.  Questionable impingement of the L3 nerves in the lateral recesses. Chronic blowout fracture of the left inferior orbital wall with associated enophthalmos.  Cerebral atrophy

## 2023-12-19 NOTE — Progress Notes (Signed)
 PROGRESS NOTE    Pam Keller  FMW:[id number] DOB: 10-Mar-1941 DOA: 12/18/2023 PCP: Pia Kerney SQUIBB, MD  Chief Complaint  Patient presents with   [facility] Course:  Pam Keller is an 83 year old female with advanced dementia, hyperlipidemia, hypertension, history of basal cell carcinoma, who presents from her memory care center after an unwitnessed fall.  Patient was pan scanned in the ED which revealed multiple nonacute findings and also revealed right humeral fracture.  She was seen by orthopedic surgery who recommended for sling placement.  Patient was admitted for workup and treatment of UTI.  Subjective: On evaluation today her daughters are at bedside.  They report that mom's mental status waxes and wanes.  They do endorse concerns with constipation.  Patient has been severely constipated in the past.  They believe prior to this fall their mom was increasingly weak and confused.  This is consistent with prior UTIs for her.   Objective: Vitals:   12/18/23 1210 12/18/23 1438 12/18/23 1544 12/19/23 0818  BP: (!) 140/68 (!) 154/74 (!) 140/96 (!) 169/127  Pulse: 60 67 71 69  Resp: 18 19 16 17   Temp: 98.2 F (36.8 C) 98.3 F (36.8 C)  99.3 F (37.4 C)  TempSrc: Oral     SpO2: 100% 98% 99% 100%  Weight:      Height:        Intake/Output Summary (Last 24 hours) at 12/19/2023 1553 Last data filed at 12/19/2023 1000 Gross per 24 hour  Intake 344 ml  Output --  Net 344 ml   Filed Weights   12/18/23 0747  Weight: 55 kg    Examination: General exam: Appears calm and comfortable, NAD  Respiratory system: No work of breathing, symmetric chest wall expansion Cardiovascular system: S1 & S2 heard, RRR.  Gastrointestinal system: Abdomen is nondistended, soft and nontender.  Neuro: Opens eyes, laughs inappropriately, does not answer questions or follow commands Extremities: Right arm in sling Skin: Thin skin, some skin tears appreciated  Assessment & Plan:   Active Problems:   UTI (urinary tract infection)   Closed fracture of right proximal humerus    Right humerus fracture status post fall - Immobilized with sling - Orthopedic surgery consulted recommends conservative care for now - Follow-up in the clinic  Constipation - Chronic problem.  Dissipate patient will not be able to drink enough fluid for MiraLAX - Start lactulose.  Titrate to 1-2 bowel movements daily  UTI - UA unimpressive, did not reflex to culture - Family concerned that she is exhibiting signs and symptoms of UTI - Will complete 3-day course of ceftriaxone   Advanced dementia - At baseline she resides in memory care facility, is ambulatory, feeds herself, memory waxes and wanes.  More recently has not been able to recognize family members consistently - Family endorses decreased p.o. 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  No acute concerns  DVT prophylaxis: Heparin    Code Status:  Full Code Disposition: Medically ready for discharge.  Have consulted TOC.  Awaiting Confirmation that she can return to her memory care center.  Consultants:  Treatment Team:  Consulting Physician: Lorelle Hussar, MD  Procedures:    Antimicrobials:  Anti-infectives (From admission, onward)    Start     Dose/Rate Route Frequency  Ordered Stop   12/18/23 0915  cefTRIAXone  (ROCEPHIN ) 1 g in sodium chloride  0.9 % 100 mL IVPB        1 g 200 mL/hr over 30 Minutes Intravenous Every 24 hours 12/18/23 0902         Data Reviewed: I have personally reviewed following labs and imaging studies CBC: Recent Labs  Lab 12/18/23 0931 12/19/23 0748  WBC 10.0 6.9  NEUTROABS 8.3*  --   HGB 10.7* 10.5*  HCT 33.5* 33.0*  MCV 90.8 89.4  PLT 237 200   Basic Metabolic Panel: Recent Labs  Lab 12/18/23 0931 12/19/23 0748  NA 141 143  K 3.7 3.7  CL 105 108  CO2 27 24  GLUCOSE 114* 97  BUN 32* 27*  CREATININE 1.21* 1.06*  CALCIUM 9.7 9.4   GFR: Estimated Creatinine Clearance: 32.7 mL/min (A) (by C-G formula based on SCr of 1.06 mg/dL (H)). Liver Function Tests: Recent Labs  Lab 12/18/23 0931 12/19/23 0748  AST 21 22  ALT 13 12  ALKPHOS 63 60  BILITOT 0.7 1.0  PROT 6.9 6.8  ALBUMIN 3.8 3.7   CBG: No results for input(s): GLUCAP in the last 168 hours.  No results found for this or any previous visit (from the past 240 hours).   Radiology Studies: DG Hand Complete Right Result Date: 12/18/2023 CLINICAL DATA:  fall, skin tear to right hand EXAM: RIGHT HAND - COMPLETE 3+ VIEW COMPARISON:  None Available. FINDINGS: Diffuse osteopenia.No acute fracture or dislocation. Severe joint space loss at the base of the thumb. Mild joint space loss of the DIP of the first and second digits. No radiopaque foreign body. IMPRESSION: 1. Diffuse osteopenia.  No acute fracture or dislocation. 2. 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 Electronically Signed   By: Rogelia Myers M.D.   On: 12/18/2023 11:52   CT Thoracic Spine Wo Contrast Result Date: 12/18/2023 CLINICAL DATA:  Back trauma, no prior imaging (Age >= 16y) EXAM: CT THORACIC SPINE WITHOUT CONTRAST TECHNIQUE: Multidetector CT images of the thoracic were obtained using the standard protocol without intravenous contrast. RADIATION DOSE REDUCTION: This exam was  performed according to the departmental dose-optimization program which includes automated exposure control, adjustment of the mA and/or kV according to patient size and/or use of iterative reconstruction technique. COMPARISON:  None Available. FINDINGS: Alignment: Mild dextroscoliosis. Slight degenerative anterolisthesis at C7-T1, T2-3 and T3-4. Vertebrae: Mild done written bone deformity of the superior endplate of T4, of uncertain chronicity. There is mild sclerosis of the endplate favoring chronicity. No apparent acute fracture. No osseous lesions are present. Paraspinal and other soft tissues: No paraspinous or epidural hematoma and no soft tissue swelling. Mild fusiform aneurysmal dilatation of the descending thoracic aorta, which measures up to 4.4 x 4.0 cm in cross-sectional diameter. The ascending thoracic aorta is mildly ectatic measuring up to 3.4 cm in diameter. There is mild mosaic attenuation of the lungs. There are hazy and reticular opacities present dependently within the lower lobes bilaterally. There is also mild bronchiolectasis. Disc levels: There is disc space narrowing and endplate ridging present diffusely throughout the thoracic spine. There is no significant disc herniation. There  is also no significant spinal canal or neural foraminal stenosis evident. IMPRESSION: 1. Mild downward bowing deformity of the superior endplate of T4, likely chronic. 2. 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 Electronically Signed   By: Evalene Coho M.D.   On: 12/18/2023 10:46   CT Maxillofacial WO CM Result Date: 12/18/2023 CLINICAL DATA:  Facial trauma, blunt EXAM: CT MAXILLOFACIAL WITHOUT CONTRAST TECHNIQUE: Multidetector CT imaging of the maxillofacial structures was performed. Multiplanar CT image reconstructions were also generated. RADIATION DOSE REDUCTION: This exam was performed according to the departmental dose-optimization program which includes automated exposure control,  adjustment of the mA and/or kV according to patient size and/or use of iterative reconstruction technique. COMPARISON:  Maxillofacial CT dated December 11, 2019. FINDINGS: Osseous: Chronic blowout fracture of the left orbital floor. No acute facial fracture. No osseous lesions. Numerous dental caries. Moderate bilateral cervical facet arthrosis. Orbits: Mild left enophthalmos secondary to orbital floor fracture. There is orbital fat and the inferior rectus muscle partially protruding through the defect. Status post bilateral lens replacement. Sinuses: Mild polypoid mucosal disease within the floor the left maxillary sinus. Soft tissues: No significant soft tissue swelling evident. Limited intracranial: Moderate cerebral atrophy. Chronic lacunar infarct within the right basal ganglia. IMPRESSION: 1. No evidence of acute traumatic injury. 2. Chronic blowout fracture of the left inferior orbital wall with associated enophthalmos. Electronically Signed   By: Evalene Coho M.D.   On: 12/18/2023 10:36   CT Lumbar Spine Wo Contrast Result Date: 12/18/2023 CLINICAL DATA:  Back trauma, no prior imaging (Age >= 16y) EXAM: CT LUMBAR SPINE WITHOUT CONTRAST TECHNIQUE: Multidetector CT imaging of the lumbar spine was performed without intravenous contrast administration. Multiplanar CT image reconstructions were also generated. RADIATION DOSE REDUCTION: This exam was performed according to the departmental dose-optimization program which includes automated exposure control, adjustment of the mA and/or kV according to patient size and/or use of iterative reconstruction technique. COMPARISON:  None Available. FINDINGS: Segmentation: 5 lumbar type vertebrae. Alignment: Slight degenerative retrolisthesis at L2-3 and also slight degenerative anterolisthesis at L4-5. Vertebrae: Vertebral bodies maintain their height. No evidence of fracture or osseous lesion. Paraspinal and other soft tissues: No paraspinous hematoma or soft tissue  injury evident. There are simple appearing exophytic cyst arising from the kidneys bilaterally. Moderate atheromatous disease within the abdominal aorta and mild ectasia. Disc levels: L1-2: Minimal disc bulging. No significant spinal canal or neural foraminal stenosis. L2-3: Mild degenerative retrolisthesis. Diffuse posterior disc bulging and moderate bilateral facet arthrosis, resulting in moderate central spinal canal stenosis and bilateral lateral recess stenosis, with questionable impingement of the L3 nerves in the lateral recesses. L3-4: Chronic degenerative disc disease and bilateral facet arthrosis, with moderate to severe central spinal canal stenosis and bilateral lateral recess stenosis. There is likely impingement of the L4 nerves in the lateral recesses. L4-5: Degenerative anterolisthesis with mild diffuse disc bulging and moderate bilateral facet arthrosis. There is thickening and calcification of the ligamentum flavum and moderate to severe central spinal canal stenosis and bilateral lateral recess stenosis. There is likely impingement of the L5 nerves in the lateral recesses. L5-S1: Right posterolateral disc protrusion and diffuse endplate ridging. There is also moderate bilateral facet arthrosis. Moderate central spinal canal stenosis and moderate to severe bilateral neural foraminal stenosis. IMPRESSION: 1. No evidence of acute traumatic injury. 2. Multilevel degenerative disc disease and facet arthrosis with varying central spinal canal stenosis and lateral recess stenosis with likely impingement of the transiting nerves in the lateral recesses at L2-3,  L3-4 and L4-5. Electronically Signed   By: Evalene Coho M.D.   On: 12/18/2023 10:29   CT Head Wo Contrast Result Date: 12/18/2023 CLINICAL DATA:  Head trauma. Un witnessed fall. Small laceration to right side of the head. EXAM: CT HEAD WITHOUT CONTRAST CT CERVICAL SPINE WITHOUT CONTRAST TECHNIQUE: Multidetector CT imaging of the head and  cervical spine was performed following the standard protocol without intravenous contrast. Multiplanar CT image reconstructions of the cervical spine were also generated. RADIATION DOSE REDUCTION: This exam was performed according to the departmental dose-optimization program which includes automated exposure control, adjustment of the mA and/or kV according to patient size and/or use of iterative reconstruction technique. COMPARISON:  04/15/2023 FINDINGS: CT HEAD FINDINGS Brain: No evidence of acute infarction, hemorrhage, hydrocephalus, extra-axial collection or mass lesion/mass effect. Bilateral remote basal ganglia lacunar infarcts. There is moderate diffuse low-attenuation within the subcortical and periventricular white matter compatible with chronic microvascular disease. Increased volume of the sulci and ventricles compatible with atrophy. Vascular: No hyperdense vessel or unexpected calcification. Skull: Negative for acute fracture or focal lesion. Remote floor of left orbit fracture. Sinuses/Orbits: No acute abnormality. Other: Mild contusion along the right frontoparietal scalp without underlying skull fracture, image 22/4. CT CERVICAL SPINE FINDINGS Alignment: No acute posttraumatic malalignment of the cervical spine. As noted on the previous exam there is 1-2 mm anterolisthesis of C3 on C4 and 3 mm anterolisthesis of C4 on C5. Skull base and vertebrae: No acute fracture or subluxation. Soft tissues and spinal canal: No prevertebral fluid or swelling. No visible canal hematoma. Disc levels: Multilevel disc space narrowing with ventral endplate spurring. This is most severe at the C5-6 and C6-7 levels. Bilateral facet arthropathy noted. Upper chest: Negative. Other: Bilateral carotid artery calcifications. IMPRESSION: 1. No acute intracranial abnormalities. 2. Mild contusion along the right frontoparietal scalp without underlying skull fracture. 3. Chronic microvascular disease and atrophy. 4. No evidence  for cervical spine fracture or subluxation. 5. 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 Electronically Signed   By: Waddell Calk M.D.   On: 12/18/2023 10:23   CT Cervical Spine Wo Contrast Result Date: 12/18/2023 CLINICAL DATA:  Head trauma. Un witnessed fall. Small laceration to right side of the head. EXAM: CT HEAD WITHOUT CONTRAST CT CERVICAL SPINE WITHOUT CONTRAST TECHNIQUE: Multidetector CT imaging of the head and cervical spine was performed following the standard protocol without intravenous contrast. Multiplanar CT image reconstructions of the cervical spine were also generated. RADIATION DOSE REDUCTION: This exam was performed according to the departmental dose-optimization program which includes automated exposure control, adjustment of the mA and/or kV according to patient size and/or use of iterative reconstruction technique. COMPARISON:  04/15/2023 FINDINGS: CT HEAD FINDINGS Brain: No evidence of acute infarction, hemorrhage, hydrocephalus, extra-axial collection or mass lesion/mass effect. Bilateral remote basal ganglia lacunar infarcts. There is moderate diffuse low-attenuation within the subcortical and periventricular white matter compatible with chronic microvascular disease. Increased volume of the sulci and ventricles compatible with atrophy. Vascular: No hyperdense vessel or unexpected calcification. Skull: Negative for acute fracture or focal lesion. Remote floor of left orbit fracture. Sinuses/Orbits: No acute abnormality. Other: Mild contusion along the right frontoparietal scalp without underlying skull fracture, image 22/4. CT CERVICAL SPINE FINDINGS Alignment: No acute posttraumatic malalignment of the cervical spine. As noted on the previous exam there is 1-2 mm anterolisthesis of C3 on C4 and 3 mm anterolisthesis of C4 on C5. Skull base and vertebrae: No acute fracture or subluxation. Soft tissues and spinal  canal: No prevertebral fluid or swelling. No  visible canal hematoma. Disc levels: Multilevel disc space narrowing with ventral endplate spurring. This is most severe at the C5-6 and C6-7 levels. Bilateral facet arthropathy noted. Upper chest: Negative. Other: Bilateral carotid artery calcifications. IMPRESSION: 1. No acute intracranial abnormalities. 2. Mild contusion along the right frontoparietal scalp without underlying skull fracture. 3. Chronic microvascular disease and atrophy. 4. No evidence for cervical spine fracture or subluxation. 5. 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 Electronically Signed   By: Waddell Calk M.D.   On: 12/18/2023 10:23   DG Shoulder Right Result Date: 12/18/2023 CLINICAL DATA:  Unwitnessed fall. EXAM: RIGHT SHOULDER - 2+ VIEW COMPARISON:  None Available. FINDINGS: Acute fracture deformity is seen involving the greater tubercle and surgical neck of the proximal right humerus. There is no evidence of dislocation. Mild degenerative changes are seen involving the right acromioclavicular joint and right glenohumeral articulation. Soft tissues are unremarkable. IMPRESSION: Acute fracture of the proximal right humerus. Electronically Signed   By: Suzen Dials M.D.   On: 12/18/2023 09:38   DG Shoulder Left Result Date: 12/18/2023 CLINICAL DATA:  Unwitnessed fall. EXAM: LEFT SHOULDER - 2+ VIEW COMPARISON:  None Available. FINDINGS: There is no evidence of fracture or dislocation. Mild degenerative changes seen involving the left acromioclavicular joint and left glenohumeral articulation. Soft tissues are unremarkable. IMPRESSION: Mild degenerative changes without evidence of an acute osseous abnormality. Electronically Signed   By: Suzen Dials M.D.   On: 12/18/2023 09:37   DG Elbow Complete Right Result Date: 12/18/2023 CLINICAL DATA:  Status post fall. EXAM: RIGHT ELBOW - COMPLETE 3+ VIEW COMPARISON:  None Available. FINDINGS: There is no evidence of fracture, dislocation, or joint  effusion. Mild chronic changes are seen involving the medial and lateral condyles of the distal right humerus. Soft tissues are unremarkable. IMPRESSION: 1. No acute osseous abnormality. Electronically Signed   By: Suzen Dials M.D.   On: 12/18/2023 09:35   DG Elbow Complete Left Result Date: 12/18/2023 CLINICAL DATA:  Unwitnessed fall. EXAM: LEFT ELBOW - COMPLETE 3+ VIEW COMPARISON:  None Available. FINDINGS: There is no evidence of fracture, dislocation, or joint effusion. There is no evidence of arthropathy or other focal bone abnormality. Soft tissues are unremarkable. IMPRESSION: Negative. Electronically Signed   By: Suzen Dials M.D.   On: 12/18/2023 09:34   DG Pelvis 1-2 Views Result Date: 12/18/2023 CLINICAL DATA:  Status post fall. EXAM: PELVIS - 1-2 VIEW COMPARISON:  None Available. FINDINGS: There is no evidence of pelvic fracture or diastasis. No pelvic bone lesions are seen. IMPRESSION: Negative. Electronically Signed   By: Suzen Dials M.D.   On: 12/18/2023 09:33    Scheduled Meds:  feeding supplement  237 mL Oral BID BM   heparin   5,000 Units Subcutaneous Q8H   Continuous Infusions:  cefTRIAXone  (ROCEPHIN )  IV 1 g (12/19/23 0903)     LOS: 1 day  MDM: Patient is high risk for one or more organ failure.  They necessitate ongoing hospitalization for continued IV therapies and subsequent lab monitoring. Total time spent interpreting labs and vitals, reviewing the medical record, coordinating care amongst consultants and care team members, directly assessing and discussing care with the patient and/or family: 55 min  Pam Elwell, DO [facility]  To contact the attending physician between 7A-7P please use Epic Chat. To contact the covering physician during after hours 7P-7A, please review Amion.  12/19/2023, 3:53 PM   *This document has been created  with the assistance of dictation software. Please excuse typographical errors. *

## 2023-12-19 NOTE — Evaluation (Signed)
 Physical Therapy Evaluation Patient Details Name: Pam Keller MRN: [id number] DOB: [birth year] Today's Date: 12/19/2023  History of Present Illness  Pt is a 83 y.o. female who presents to the ED on 12/18/23 from [facility] after an unwitnessed fall with sustained head injuries and a RUE injury. Admitted for UTI and encephalopathy. 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  Per chart review, pt has had increased confusion from baseline and noted weakness. Per daughter reports, pt is very active, often helping staff out and only oriented to self.   PMH:HLD,HTN,anxiety, depression, basal cell carcinoma, dementia  Clinical Impression  Prior to recent fall, pt was living in [facility] memory care unit, oriented to name, required assistance with ADLs, and was able to ambulate small distances without an AD (in the presence of staff).  Due to current NWB precautions on RUE, pt wore a sling throughout session. Currently pt is performing bed mobility (supine to sit) with Min a/CGA (to square hips towards the EOB), performed 2 trials of STS with CGA/min a and was able to ambulate 190 ft with handhold assist. Gait is slow paced, with limited fwd progression for long distances. Pt is highly distractible and requires constant vc's for redirection and to refocus on the task at hand. PT had inconsistent success with vc's, often just smiling and providing comfort throughout ambulation bout. Once seated, pt is reluctant to get up again despite a plethora of vc's/engaging stimuli. Pt required 2x mod assist to reposition in bed to finish treatment. Pt would currently benefit from skilled PT to address noted impairments and functional limitations (see below for any additional details).  Upon hospital discharge, pt would benefit from ongoing therapy.       If  plan is discharge home, recommend the following: A little help with walking and/or transfers;A lot of help with bathing/dressing/bathroom;Assistance with cooking/housework;Assist for transportation;Direct supervision/assist for medications management;Supervision due to cognitive status;Help with stairs or ramp for entrance;Assistance with feeding (Pt right handed and currently with RUE NWB precuations)   Can travel by private vehicle        Equipment Recommendations Other (comment) (TBD in subsequent sessions)  Recommendations for Other Services       Functional Status Assessment Patient has had a recent decline in their functional status and/or demonstrates limited ability to make significant improvements in function in a reasonable and predictable amount of time     Precautions / Restrictions Precautions Precautions: Fall Recall of Precautions/Restrictions: Impaired Precaution/Restrictions Comments: Cognition Restrictions Weight Bearing Restrictions Per Provider Order: Yes RUE Weight Bearing Per Provider Order: Non weight bearing, (sling RUE)      Mobility  Bed Mobility Overal bed mobility: Needs Assistance Bed Mobility: Supine to Sit, Sit to Supine     Supine to sit: Min assist, Contact guard (to square hips) Sit to supine: Mod assist, +2 for safety/equipment (to reposition in bed following treatment)   General bed mobility comments: PT attempted a plethora of multimodal cueing techniques with intermittent success.    Transfers Overall transfer level: Needs assistance Equipment used: 1 person hand held assist Transfers: Sit to/from Stand Sit to Stand: Contact guard assist, Min assist (for LE management and to assist patient with precaution adherance. Pt was able to complete 2x trials of STS)           General transfer comment: PT attempted a plethora of multimodal  cueing techniques with intermittent success.    Ambulation/Gait Ambulation/Gait assistance: Min  assist (hand held assist) Gait Distance (Feet): 190 Feet Assistive device: 1 person hand held assist Gait Pattern/deviations: Step-to pattern, Decreased step length - right, Decreased step length - left, Narrow base of support (decreased arm swing, leaning left)       General Gait Details: Slow paced ambulation with limited fwd progression for long distances. Pt is highly distractible and requires constant vc's for redirection and to refocus on the task at hand. PT had inconsistent success with vc's, often just smiling and providing comfort throughout ambulation bout. Once seated, pt is reluctant to get up again, PT attempted a plethora of vc's and gave targets towards engaging stimuli without success.  Stairs            Wheelchair Mobility     Tilt Bed    Modified Rankin (Stroke Patients Only)       Balance Overall balance assessment: Needs assistance Sitting-balance support: Feet unsupported, Single extremity supported Sitting balance-Leahy Scale: Good Sitting balance - Comments: Steady static seated balance within BOS   Standing balance support: Single extremity supported, During functional activity Standing balance-Leahy Scale: Good Standing balance comment: Steady dynamic standing balance, able to fully extend RLE to 70 degrees of hip flexion without an overt LOB.                             Pertinent Vitals/Pain Pain Assessment Pain Assessment: PAINAD Breathing: normal Negative Vocalization: none Facial Expression: sad, frightened, frown, briefly at beginning and end of session, easily consolable.  Body Language: relaxed Consolability: distracted or reassured by voice/touch PAINAD Score: 2 Pain Intervention(s): Limited activity within patient's tolerance, Premedicated before session, Repositioned, Monitored during session    Home Living Family/patient expects to be discharged to:: Assisted living                 Home Equipment: Grab bars -  tub/shower;Shower seat;Grab bars - toilet Additional Comments: Assisted living has additional AD/equiptment if needed    Prior Function Prior Level of Function : Needs assist  Cognitive Assist : Mobility (cognitive) Mobility (Cognitive): Step by step cues         Mobility Comments: Able to walk without an AD around the unit. First reported fall, with no recent history of falling. ADLs Comments: Required additional support for bathing and encouragement to perform personal hygiene, bathing, and dressing. Per daughter report, she states the patient has had a smaller appetite these last few days and has not been eating/drinking much.     Extremity/Trunk Assessment   Upper Extremity Assessment Upper Extremity Assessment: Generalized weakness;Right hand dominant RUE Deficits / Details: RUE proximal humerus fx, patient is NWB to RUE and is in immobilizing sling RUE: Unable to fully assess due to pain;Unable to fully assess due to immobilization (NWB to RUE, in immobilizing sling)    Lower Extremity Assessment Lower Extremity Assessment: Overall WFL for tasks assessed;LLE deficits/detail;RLE deficits/detail RLE Deficits / Details: Grossly 4/5 LLE Deficits / Details: Grossly 4/5    Cervical / Trunk Assessment Cervical / Trunk Assessment: Normal  Communication   Communication Communication: Impaired Factors Affecting Communication: Difficulty expressing self;Reduced clarity of speech    Cognition Arousal: Alert Behavior During Therapy: WFL for tasks assessed/performed (has moments of heightened excitement and intermittent moments of sadness)   PT - Cognitive impairments: History of cognitive impairments, Initiation, Sequencing, Memory, Attention, Safety/Judgement, Orientation   Orientation  impairments: Person                   PT - Cognition Comments: Pt is nonverbal,  but will attempt to have conversations with you. Overall extremely pleasant Following commands:  Impaired Following commands impaired: Follows one step commands inconsistently     Cueing Cueing Techniques: Verbal cues, Gestural cues, Tactile cues, Visual cues     General Comments General comments (skin integrity, edema, etc.): HR stable and monitored throughout session.    Exercises     Assessment/Plan    PT Assessment Patient needs continued PT services  PT Problem List Decreased strength;Decreased mobility;Decreased range of motion;Decreased activity tolerance;Decreased balance;Decreased cognition;Decreased knowledge of use of DME;Decreased safety awareness;Pain       PT Treatment Interventions DME instruction;Gait training;Therapeutic activities;Functional mobility training;Balance training;Patient/family education;Cognitive remediation;Therapeutic exercise    PT Goals (Current goals can be found in the Care Plan section)  Acute Rehab PT Goals PT Goal Formulation: Patient unable to participate in goal setting Time For Goal Achievement: 01/02/24 Potential to Achieve Goals: Good    Frequency Min 1X/week     Co-evaluation               AM-PAC PT 6 Clicks Mobility  Outcome Measure Help needed turning from your back to your side while in a flat bed without using bedrails?: None Help needed moving from lying on your back to sitting on the side of a flat bed without using bedrails?: A Little Help needed moving to and from a bed to a chair (including a wheelchair)?: A Little Help needed standing up from a chair using your arms (e.g., wheelchair or bedside chair)?: A Little Help needed to walk in hospital room?: A Little Help needed climbing 3-5 steps with a railing? : A Lot 6 Click Score: 18    End of Session Equipment Utilized During Treatment: Gait belt;Other (comment) (RUE sling) Activity Tolerance: Patient tolerated treatment well Patient left: in bed;with bed alarm set;with nursing/sitter in room;with call bell/phone within reach Nurse Communication:  Mobility status;Precautions PT Visit Diagnosis: Unsteadiness on feet (R26.81);History of falling (Z91.81);Pain;Muscle weakness (generalized) (M62.81) Pain - Right/Left: Right Pain - part of body: Shoulder;Arm    Time: 1010-1048 PT Time Calculation (min) (ACUTE ONLY): 38 min   Charges:               Lynniah Janoski, SPT 12/19/23, 1:15 PM

## 2023-12-19 NOTE — Evaluation (Signed)
 Occupational Therapy Evaluation Patient Details Name: Pam Keller MRN: [id number] DOB: November 29, 1940 Today's Date: 12/19/2023   History of Present Illness   Pt is a 83 y.o. female who presents to the ED on 12/19/23 from SNF after an unwitnessed fall with sustained head injuries and a RUE injury. 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  Per chart review, pt has had increased confusion from baseline and noted weakness. Per daughter reports, pt is very active, often helping staff out and only oriented to self.   PMH:HLD,HTN,anxiety, depression, basal cell carcinoma, dementia     Clinical Impressions Patient was seen for an OT evaluation this date. Pt lives in Memory Care Unit at [facility] where she was receiving assistance with all ADLs as needed, was able to engage in dressing preparation in standing, functional mobility completed without DME.  Staff support for all self care tasks as needed.  Pt has orders for RUE to be immobilized and will be NWBing per MD. Patient presents with impaired strength/ROM, pain, and sensation to RUE. These impairments result in a decreased ability to engage in self care tasks requiring Max A/Near total Dependence for UB/LB dressing and bathing. OT adjusted sling/immobilizer to improve comfort, optimize positioning, and to maximize skin integrity/safety. Pt will benefit from skilled OT services to address these limitations and improve independence/engagement in daily tasks.       If plan is discharge home, recommend the following:   A lot of help with bathing/dressing/bathroom;A lot of help with walking and/or transfers;Assistance with feeding     Functional Status Assessment   Patient has had a recent decline in their  functional status and demonstrates the ability to make significant improvements in function in a reasonable and predictable amount of time.     Equipment Recommendations   Toilet riser     Recommendations for Other Services         Precautions/Restrictions   Precautions Precautions: Fall;Other (comment) (Right humeral fx, NWB - sling at all times) Recall of Precautions/Restrictions: Impaired Precaution/Restrictions Comments: Cognition - Right humeral fx, NWB - sling at all times Required Braces or Orthoses: Sling (Right humeral fx, NWB - sling at all times) Restrictions Weight Bearing Restrictions Per Provider Order: Yes RUE Weight Bearing Per Provider Order: Non weight bearing     Mobility Bed Mobility Overal bed mobility: Needs Assistance Bed Mobility: Sidelying to Sit, Supine to Sit   Sidelying to sit: Max assist Supine to sit: Min assist, Contact guard     General bed mobility comments: Attempted forward and backward chaining techniques to encourage engagement with inconstent response    Transfers Overall transfer level: Needs assistance Equipment used: 1 person hand held assist Transfers: Sit to/from Stand, Bed to chair/wheelchair/BSC Sit to Stand: Contact guard assist, Min assist     Step pivot transfers: Contact guard assist     General transfer comment: significant cuing required for task initiation and follow-through, cognition and pain in RUE are limiting factors at this time.      Balance Overall balance assessment: Needs assistance Sitting-balance support: Feet unsupported, Single extremity supported Sitting balance-Leahy Scale: Good Sitting balance - Comments: Static sitting EOB   Standing balance support: Single extremity supported, During functional activity Standing balance-Leahy Scale: Fair  ADL either performed or assessed with clinical judgement   ADL Overall ADL's : Needs  assistance/impaired Eating/Feeding: Maximal assistance Eating/Feeding Details (indicate cue type and reason): significant cuing after initial set-up, patient very easily distractable Grooming: Wash/dry face;Maximal assistance   Upper Body Bathing: Total assistance   Lower Body Bathing: Total assistance   Upper Body Dressing : Total assistance   Lower Body Dressing: Total assistance   Toilet Transfer: Maximal assistance;Minimal assistance Toilet Transfer Details (indicate cue type and reason): simulation from EOB to chair with stand pivot transfer Toileting- Clothing Manipulation and Hygiene: Total assistance       Functional mobility during ADLs: Contact guard assist General ADL Comments: poor safety awareness     Vision         Perception         Praxis         Pertinent Vitals/Pain Pain Assessment Pain Assessment: Faces Faces Pain Scale: Hurts whole lot (RUE) Breathing: normal Negative Vocalization: none Facial Expression: facial grimacing Body Language: tense, distressed pacing, fidgeting Consolability: unable to console, distract or reassure PAINAD Score: 5 Pain Location: RUE Pain Descriptors / Indicators: Other (Comment) (Patient is essentially non-verbal and unable to provide discriptors for pain) Pain Intervention(s): Limited activity within patient's tolerance, Repositioned, Patient requesting pain meds-RN notified (Based on FACES pain scale, patient's daughters reported she last had pain medication around 8am.)     Extremity/Trunk Assessment Upper Extremity Assessment Upper Extremity Assessment: Generalized weakness;Right hand dominant RUE Deficits / Details: RUE proximal humerus fx, patient is NWB to RUE and is in immobilizing sling RUE: Unable to fully assess due to pain;Unable to fully assess due to immobilization (NWB to RUE, in immobilizing sling)   Lower Extremity Assessment Lower Extremity Assessment: Overall WFL for tasks assessed;LLE  deficits/detail;RLE deficits/detail RLE Deficits / Details: Grossly 4/5 LLE Deficits / Details: Grossly 4/5   Cervical / Trunk Assessment Cervical / Trunk Assessment: Normal   Communication Communication Communication: Impaired Factors Affecting Communication: Difficulty expressing self;Reduced clarity of speech   Cognition Arousal: Alert Behavior During Therapy: Anxious Cognition: Cognition impaired   Orientation impairments: Person                           Following commands: Impaired Following commands impaired: Follows one step commands inconsistently     Cueing  General Comments   Cueing Techniques: Verbal cues;Gestural cues;Tactile cues;Visual cues  HR monitored throughout session.   Exercises Other Exercises Other Exercises: Education on transfers, chaining technique for light grooming tasks, education to patient's daughter on sling positioning (daughter reported that she would be getting a padded vs the current style patient is using).   Shoulder Instructions      Home Living Family/patient expects to be discharged to:: Assisted living                             Home Equipment: Grab bars - tub/shower;Shower seat;Grab bars - toilet   Additional Comments: Assisted living has additional AD/equiptment if needed      Prior Functioning/Environment Prior Level of Function : Needs assist  Cognitive Assist : Mobility (cognitive) Mobility (Cognitive): Step by step cues         Mobility Comments: Able to walk without an AD around the unit. First reported fall, with no recent history of falling. ADLs Comments: Required additional support for bathing and encouragement to perform personal hygiene, bathing, and dressing. Per daughter  report, she states the patient has had a smaller appetite these last few days and has not been eating/drinking much.    OT Problem List: Decreased strength;Decreased coordination;Pain;Decreased range of  motion;Decreased activity tolerance;Decreased safety awareness;Impaired balance (sitting and/or standing);Decreased knowledge of precautions;Impaired UE functional use   OT Treatment/Interventions: Self-care/ADL training;Balance training;Therapeutic activities      OT Goals(Current goals can be found in the care plan section)   Acute Rehab OT Goals Patient Stated Goal: Patient did not state - essentially non-verbal OT Goal Formulation: Patient unable to participate in goal setting Time For Goal Achievement: 01/02/24 Potential to Achieve Goals: Fair ADL Goals Pt Will Perform Eating: sitting;with set-up Pt Will Perform Grooming: with supervision;sitting Pt Will Transfer to Toilet: with contact guard assist   OT Frequency:  Min 2X/week    Co-evaluation              AM-PAC OT 6 Clicks Daily Activity     Outcome Measure Help from another person eating meals?: A Lot Help from another person taking care of personal grooming?: Total Help from another person toileting, which includes using toliet, bedpan, or urinal?: Total Help from another person bathing (including washing, rinsing, drying)?: Total Help from another person to put on and taking off regular upper body clothing?: Total Help from another person to put on and taking off regular lower body clothing?: Total 6 Click Score: 7   End of Session Equipment Utilized During Treatment: Gait belt;Other (comment) (RUE shoulder immobilizer sling) Nurse Communication: Mobility status;Need for lift equipment;Patient requests pain meds;Precautions;Weight bearing status  Activity Tolerance: Patient limited by pain;Other (comment) (Cognition and pain are limiting factors with overall activity tolerance.  Patient is very easily distractable.) Patient left: in chair;with call bell/phone within reach;with chair alarm set;with family/visitor present  OT Visit Diagnosis: Unsteadiness on feet (R26.81);Repeated falls (R29.6);Muscle weakness  (generalized) (M62.81);History of falling (Z91.81);Pain Pain - Right/Left: Right Pain - part of body: Shoulder                Time: 8782-8751 OT Time Calculation (min): 31 min Charges:  OT General Charges $OT Visit: 1 Visit OT Evaluation $OT Eval Moderate Complexity: 1 Mod  Mamta Rimmer OTR/L   Harlene LITTIE Sharps 12/19/2023, 1:27 PM

## 2023-12-19 NOTE — Plan of Care (Signed)

## 2023-12-19 NOTE — Plan of Care (Signed)

## 2023-12-20 DIAGNOSIS — S42201A Unspecified fracture of upper end of right humerus, initial encounter for closed fracture: Secondary | ICD-10-CM | POA: Diagnosis not present

## 2023-12-20 DIAGNOSIS — N3000 Acute cystitis without hematuria: Secondary | ICD-10-CM | POA: Diagnosis not present

## 2023-12-20 MED ORDER — LORAZEPAM 2 MG/ML IJ SOLN: 1.0000 mg | Freq: Once | INTRAMUSCULAR | Status: DC | PRN

## 2023-12-20 MED ORDER — QUETIAPINE FUMARATE 25 MG PO TABS
12.5000 mg | ORAL_TABLET | Freq: Once | ORAL | Status: AC
  Administered 2023-12-20: 12.5 mg via ORAL
  Filled 2023-12-20: qty 1

## 2023-12-20 MED ORDER — FLEET ENEMA RE ENEM
1.0000 | ENEMA | Freq: Once | RECTAL | Status: AC
  Administered 2023-12-20: 1 via RECTAL

## 2023-12-20 MED ORDER — LORAZEPAM 2 MG/ML IJ SOLN
1.0000 mg | Freq: Once | INTRAMUSCULAR | Status: AC
  Administered 2023-12-20: 1 mg via INTRAVENOUS
  Filled 2023-12-20: qty 1

## 2023-12-20 NOTE — Progress Notes (Signed)
 PROGRESS NOTE    Pam Keller  FMW:[id number] DOB: January 24, 1941 DOA: 12/18/2023 PCP: Pia Kerney SQUIBB, MD  Chief Complaint  Patient presents with   [facility] Course:  Pam Keller is an 83 year old female with advanced dementia, hyperlipidemia, hypertension, history of basal cell carcinoma, who presents from her memory care center after an unwitnessed fall.  Patient was pan scanned in the ED which revealed multiple nonacute findings and also revealed right humeral fracture.  She was seen by orthopedic surgery who recommended for sling placement.  Patient was admitted for workup and treatment of UTI.  Subjective: No acute events overnight.  Daughters are at bedside and having more success getting the patient to eat and take medications. PT has recommended for SNF.  After discussion with the family, memory care center appears to be a better discharge plan for the patient as long as we can ensure adequate supervision.  Patient is at her mental status baseline but does require additional assistance with eating, and ambulating now.  She is unsteady on her feet compared to prior.  She is right-handed and has difficulty eating now given the sling.  Given her dementia she may do better with memory care facility where she is already familiar with staff.  Family is looking into acquiring a sitter in this setting.  If they are unable to acquire a sitter at the memory care center we will pursue SNF for PT/OT rehab  Objective: Vitals:   12/19/23 0818 12/19/23 1950 12/20/23 0354 12/20/23 0744  BP: (!) 169/127 (!) 173/94 (!) 156/85 (!) 160/70  Pulse: 69 61 (!) 57 (!) 55  Resp: 17 18 16 18   Temp: 99.3 F (37.4 C) 98.7 F (37.1 C) 97.7 F (36.5 C) 98.2 F (36.8 C)  TempSrc:  Axillary  Oral  SpO2: 100% 97% 99% 95%  Weight:      Height:        Intake/Output Summary (Last 24 hours) at 12/20/2023 1539 Last data filed at 12/20/2023 1300 Gross per 24 hour  Intake 100 ml  Output 1 ml  Net  99 ml   Filed Weights   12/18/23 0747  Weight: 55 kg    Examination: General exam: Appears calm and comfortable, NAD  Respiratory system: No work of breathing, symmetric chest wall expansion Cardiovascular system: S1 & S2 heard, RRR.  Gastrointestinal system: Abdomen is nondistended, soft and nontender.  Neuro: Opens eyes, laughs inappropriately, does not answer questions or follow commands Extremities: Right arm in sling Skin: Thin skin, some skin tears appreciated  Assessment & Plan:  Active Problems:   UTI (urinary tract infection)   Closed fracture of right proximal humerus    Right humerus fracture status post fall - Immobilized with sling - Orthopedic surgery consulted recommends conservative care for now - Follow-up in the clinic  Constipation - Chronic problem.  Anticipate she will not be able to drink enough fluid for MiraLAX - Have started on lactulose, some difficulty with taking this medication.  Previously has tried senna but abdominal cramping with this medication. - Family reports the patient has previously manually disimpacted - KUB not specifically obtained but on review of initial imaging stool burden does not appear overly impressive.  Will order for fleets enema  UTI - UA unimpressive, did not reflex to culture - Family concerned that she is exhibiting signs and symptoms of UTI - Will complete 3-day course of ceftriaxone   Advanced dementia - At baseline she resides in memory care facility,  is ambulatory, feeds herself, memory waxes and wanes.  More recently has not been able to recognize family members consistently - Family endorses decreased p.o. intake recently, suspect she is nearing end stage - Presently her mental status is at baseline.  She does better when family members are at bedside. 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  No acute concerns  DVT prophylaxis: Heparin    Code Status: Full Code Disposition: Medically ready for discharge.  Have consulted TOC.  Awaiting Confirmation that she can return to her memory care center with sitter as arranged by family  Consultants:  Treatment Team:  Consulting Physician: Lorelle Hussar, MD  Procedures:    Antimicrobials:  Anti-infectives (From admission, onward)    Start     Dose/Rate Route Frequency Ordered Stop   12/20/23 1000  cefTRIAXone  (ROCEPHIN ) 1 g in sodium chloride  0.9 % 100 mL IVPB        1 g 200 mL/hr over 30 Minutes Intravenous Every 24 hours 12/19/23 1618 12/23/23 0959   12/18/23 0915  cefTRIAXone  (ROCEPHIN ) 1 g in sodium chloride  0.9 % 100 mL IVPB  Status:  Discontinued        1 g 200 mL/hr over 30 Minutes Intravenous Every 24 hours 12/18/23 0902 12/19/23 1618       Data Reviewed: I have personally reviewed following labs and imaging studies CBC: Recent Labs  Lab 12/18/23 0931 12/19/23 0748  WBC 10.0 6.9  NEUTROABS 8.3*  --   HGB 10.7* 10.5*  HCT 33.5* 33.0*  MCV 90.8 89.4  PLT 237 200   Basic Metabolic Panel: Recent Labs  Lab 12/18/23 0931 12/19/23 0748  NA 141 143  K 3.7 3.7  CL 105 108  CO2 27 24  GLUCOSE 114* 97  BUN 32* 27*  CREATININE 1.21* 1.06*  CALCIUM 9.7 9.4   GFR: Estimated Creatinine  Clearance: 32.7 mL/min (A) (by C-G formula based on SCr of 1.06 mg/dL (H)). Liver Function Tests: Recent Labs  Lab 12/18/23 0931 12/19/23 0748  AST 21 22  ALT 13 12  ALKPHOS 63 60  BILITOT 0.7 1.0  PROT 6.9 6.8  ALBUMIN 3.8 3.7   CBG: No results for input(s): GLUCAP in the last 168 hours.  No results found for this or any previous visit (from the past 240 hours).   Radiology Studies: No results found.   Scheduled Meds:  aspirin  325 mg Oral Daily   atorvastatin  40 mg Oral Daily   busPIRone  10 mg Oral BID   donepezil   5 mg Oral QHS   feeding supplement  237 mL Oral BID BM   heparin   5,000 Units Subcutaneous Q8H   hydrALAZINE  25 mg Oral BID   lactulose  10 g Oral TID   losartan   100 mg Oral Daily   metoprolol succinate  50 mg Oral Daily  sertraline  50 mg Oral Daily   traZODone  25 mg Oral QHS   Continuous Infusions:  cefTRIAXone  (ROCEPHIN )  IV 1 g (12/20/23 0904)     LOS: 2 days  MDM: Patient is high risk for one or more organ failure.  They necessitate ongoing hospitalization for continued IV therapies and subsequent lab monitoring. Total time spent interpreting labs and vitals, reviewing the medical record, coordinating care amongst consultants and care team members, directly assessing and discussing care with the patient and/or family: 55 min  Hadleigh Felber, DO [facility]  To contact the attending physician between 7A-7P please use Epic Chat. To contact the covering physician during after hours 7P-7A, please review Amion.  12/20/2023, 3:39 PM   *This document has been created with the assistance of dictation software. Please excuse typographical errors. *

## 2023-12-20 NOTE — Care Management Important Message (Signed)
 Important Message  Patient Details  Name: Pam Keller MRN: [id number] Date of Birth: 11-Apr-1941   Important Message Given:  Yes - Medicare IM     Lathen Seal W, CMA 12/20/2023, 11:39 AM

## 2023-12-20 NOTE — Plan of Care (Signed)

## 2023-12-20 NOTE — [place] FL2 (Signed)
 [place]  MEDICAID FL2 LEVEL OF CARE FORM     IDENTIFICATION  Patient Name: Pam Keller Birthdate: 1941/06/15 Sex: female Admission Date (Current Location): 12/18/2023  [facility] and [place] Number:  Chiropodist and Address:  [facility], [address], [place], [place] [address]      Provider Number: [id number]  Attending Physician Name and Address:  Leesa Kast, DO  Relative Name and Phone Number:  [name] [phone])    Current Level of Care: Hospital Recommended Level of Care: Memory Care Prior Approval Number:    Date Approved/Denied:   PASRR Number:    Discharge Plan: Other (Comment) (Memory Care Unit)    Current Diagnoses: Patient Active Problem List   Diagnosis Date Noted   UTI (urinary tract infection) 12/18/2023   Closed fracture of right proximal humerus 12/18/2023   Memory impairment 12/17/2014   Anxiety and depression 12/17/2014   Long-term use of high-risk medication 10/06/2014   Hypokalemia 11/19/2013   Chronic insomnia 10/05/2013   Carotid bruit present 10/04/2013   Occipital headache 09/29/2012   Neck stiffness 09/08/2012   Pseudophakia 04/23/2012   Screening for colon cancer 09/29/2011   Medicare annual wellness visit, subsequent 09/27/2011   Hyperlipidemia    Hypertension    Pulmonary nodule    Basal cell carcinoma    Screening for breast cancer 04/29/2011   Screening for cervical cancer 04/29/2011   History of postmenopausal HRT 04/29/2011   Microscopic hematuria    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    Orientation RESPIRATION BLADDER Height & Weight     Self  Normal Incontinent Weight: 55 kg Height:  5' 3 (160 cm)  BEHAVIORAL SYMPTOMS/MOOD NEUROLOGICAL BOWEL NUTRITION STATUS      Incontinent    AMBULATORY STATUS COMMUNICATION OF NEEDS Skin   Total Care   Other (Comment) (2 skin tears)                       Personal Care Assistance Level of Assistance  Total care       Total Care  Assistance: Maximum assistance   Functional Limitations Info             SPECIAL CARE FACTORS FREQUENCY                       Contractures      Additional Factors Info  Code Status Code Status Info: Full             Current Medications (12/20/2023):  This is the current hospital active medication list Current Facility-Administered Medications  Medication Dose Route Frequency Provider Last Rate Last Admin   acetaminophen  (TYLENOL ) tablet 650 mg  650 mg Oral Q6H PRN Debby Camila LABOR, MD       Or   acetaminophen  (TYLENOL ) suppository 650 mg  650 mg Rectal Q6H PRN Debby Camila LABOR, MD       albuterol  (PROVENTIL ) (2.5 MG/3ML) 0.083% nebulizer solution 2.5 mg  2.5 mg Nebulization Q2H PRN Thomas, Sara-Maiz A, MD       aspirin chewable tablet 325 mg  325 mg Oral Daily Dezii, Alexandra, DO   325 mg at 12/20/23 0838   atorvastatin (LIPITOR) tablet 40 mg  40 mg Oral Daily Dezii, Alexandra, DO   40 mg at 12/20/23 0839   busPIRone (BUSPAR) tablet 10 mg  10 mg Oral BID Dezii, Alexandra, DO   10 mg at 12/20/23 0839   cefTRIAXone  (ROCEPHIN ) 1 g in sodium chloride   0.9 % 100 mL IVPB  1 g Intravenous Q24H Dezii, Alexandra, DO 200 mL/hr at 12/20/23 0904 1 g at 12/20/23 0904   donepezil  (ARICEPT ) tablet 5 mg  5 mg Oral QHS Dezii, Alexandra, DO   5 mg at 12/19/23 2132   feeding supplement (ENSURE PLUS HIGH PROTEIN) liquid 237 mL  237 mL Oral BID BM Debby Hitch A, MD   237 mL at 12/19/23 0818   heparin  injection 5,000 Units  5,000 Units Subcutaneous Q8H Debby Hitch LABOR, MD   5,000 Units at 12/20/23 0534   hydrALAZINE (APRESOLINE) tablet 25 mg  25 mg Oral BID Dezii, Alexandra, DO   25 mg at 12/20/23 0838   lactulose (CHRONULAC) 10 GM/15ML solution 10 g  10 g Oral TID Dezii, Alexandra, DO       losartan  (COZAAR ) tablet 100 mg  100 mg Oral Daily Dezii, Alexandra, DO   100 mg at 12/20/23 9161   metoprolol succinate (TOPROL-XL) 24 hr tablet 50 mg  50 mg Oral Daily Dezii, Alexandra,  DO   50 mg at 12/19/23 1706   ondansetron  (ZOFRAN ) tablet 4 mg  4 mg Oral Q6H PRN Debby Hitch LABOR, MD       Or   ondansetron  (ZOFRAN ) injection 4 mg  4 mg Intravenous Q6H PRN Debby Hitch LABOR, MD       sertraline (ZOLOFT) tablet 50 mg  50 mg Oral Daily Dezii, Alexandra, DO   50 mg at 12/20/23 9160   traMADol  (ULTRAM ) tablet 50 mg  50 mg Oral Q6H PRN Debby Hitch LABOR, MD   50 mg at 12/19/23 1308   traZODone (DESYREL) tablet 25 mg  25 mg Oral QHS Dezii, Alexandra, DO   25 mg at 12/19/23 2132     Discharge Medications: Please see discharge summary for a list of discharge medications.  Relevant Imaging Results:  Relevant Lab Results:   Additional Information SS#[id number]  Marinda Cooks, RN

## 2023-12-21 DIAGNOSIS — N3000 Acute cystitis without hematuria: Secondary | ICD-10-CM | POA: Diagnosis not present

## 2023-12-21 DIAGNOSIS — S42201A Unspecified fracture of upper end of right humerus, initial encounter for closed fracture: Secondary | ICD-10-CM | POA: Diagnosis not present

## 2023-12-21 MED ORDER — ORAL CARE MOUTH RINSE: 15.0000 mL | OROMUCOSAL | Status: DC | PRN

## 2023-12-21 NOTE — Discharge Summary (Signed)
 Physician Discharge Summary  Pam Keller FMW:[id number] DOB: 1941-02-16 DOA: 12/18/2023  PCP: Pia Kerney SQUIBB, MD  Admit date: 12/18/2023 Discharge date: 12/21/2023   Recommendations for Outpatient Follow-up:  Follow up with PCP in 1-2 weeks to continue to manage chronic medications Orthopedic surgery referral for right humerus fracture follow-up    Hospital Course: Pam Keller is an 83 year old female with advanced dementia, hyperlipidemia, hypertension, history of basal cell carcinoma, who presents from her memory care center after an unwitnessed fall.  Patient was pan scanned in the ED which revealed multiple nonacute findings and also revealed right humeral fracture.  She was seen by orthopedic surgery who recommended for sling placement.  Patient was admitted for workup and treatment of UTI.  She completed 3 days antibiotics for UTI treatment. Patient worked well with physical therapy and Occupational Therapy. Given her right arm fracture, she will require closer supervision than baseline.  PT/OT has recommended skilled nursing facility placement but given patient's advanced dementia and high risk for delirium she would do better in her memory care center where she is familiar with the staff and settings.  Preferably patient has a Designer, multimedia available for assistance given she now has decreased use of her right arm which causes some ambulatory dysfunction and the ability to feed herself. Stay was briefly complicated by intermittent delirium superimposed on her advanced dementia.  She responded well to medication.  I expect this problem will resolve when she has returned to her home environment   Right humerus fracture status post fall - Immobilized with sling - Orthopedic surgery consulted recommends conservative care for now - Follow-up in the clinic   Constipation - Chronic problem.  Anticipate she will not be able to drink enough fluid for MiraLAX - Have started on  lactulose, some difficulty with taking this medication.  Previously has tried senna but abdominal cramping with this medication, can resume if she is unable to take lactulose. - Family reports the patient has previously manually disimpacted - KUB not specifically obtained but on review of initial imaging stool burden does not appear overly impressive.  Status post enema x 1.   UTI - UA unimpressive, did not reflex to culture - Family concerned that she is exhibiting signs and symptoms of UTI - This was 3-day course ceftriaxone    Advanced dementia - At baseline she resides in memory care facility, is ambulatory, feeds herself, memory waxes and wanes.  More recently has not been able to recognize family members consistently - Family endorses decreased p.o. intake recently, suspect she is nearing end stage - Discussed with patient's family that they can pursue hospice outpatient if they find they are ready for this - Presently her mental status is at baseline.  She does better when family members are at bedside. - Preferably patient would return to memory care center where she is familiar with staff and setting - Would benefit from 24/7 assistance with sitter.  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  No acute concerns  Discharge Instructions  Discharge Instructions      Call MD for:  difficulty breathing, headache or visual disturbances   Complete by: As directed    Call MD for:  persistant dizziness or light-headedness   Complete by: As directed    Call MD for:  persistant nausea and vomiting   Complete by: As directed    Call MD for:  severe uncontrolled pain   Complete by: As directed    Call MD for:  temperature >100.4   Complete by: As directed    Diet general   Complete by: As directed    Discharge instructions   Complete by: As directed    Follow up with your primary care physician to discuss the medication changes during this admission   Increase activity slowly   Complete by: As directed       Allergies as of 12/21/2023       Reactions   Meperidine Other (See Comments)   Other Reaction: GI Upset Other reaction(s): Other (See Comments) Other reaction(s): Other (See Comments) Other Reaction: GI Upset Other Reaction: GI Upset   Clindamycin    Clindamycin/lincomycin    Demerol    Sulfa Antibiotics    Codeine    Other reaction(s): Other (See Comments) Other Reaction: GI Upset   Penicillins    Other reaction(s): Other (See Comments) Other Reaction: GI Upset        Medication List     STOP taking these medications    furosemide 20 MG tablet Commonly known as: LASIX   potassium chloride  10 MEQ CR capsule Commonly known as: MICRO-K        TAKE these medications    allopurinol 100 MG tablet Commonly known as: ZYLOPRIM Take 100 mg by mouth daily.   aspirin 81 MG chewable tablet Chew 325 mg by mouth daily.   atorvastatin 40 MG tablet Commonly known as: LIPITOR Take 40 mg by mouth daily.   busPIRone 10 MG tablet Commonly known as: BUSPAR Take 10 mg by mouth 2 (two) times daily.   cholecalciferol 25 MCG (1000 UNIT) tablet Commonly known as: VITAMIN D3 Take by mouth.   donepezil  5 MG tablet Commonly known as: ARICEPT  TAKE 1 TABLET (5 MG TOTAL) BY MOUTH AT BEDTIME.   hydrALAZINE 25 MG tablet Commonly  known as: APRESOLINE Take 25 mg by mouth 2 (two) times daily.   Iron 325 (65 Fe) MG Tabs Take by mouth.   LORazepam 0.5 MG tablet Commonly known as: ATIVAN Take by mouth.   losartan  100 MG tablet Commonly known as: COZAAR  Take 100 mg by mouth daily.   metoprolol succinate 25 MG 24 hr tablet Commonly known as: TOPROL-XL Take 50 mg by mouth daily.   Minerin Creme Crea   sertraline 100 MG tablet Commonly known as: ZOLOFT Take 50 mg by mouth daily.   Sodium Fluoride 5000 Plus 1.1 % Crea dental cream Generic drug: sodium fluoride 1 Application 2 (two) times daily. 1 Application Topical Twice Daily   traZODone 50 MG tablet Commonly known as: DESYREL Take by mouth.        Follow-up Information     Pia Kerney SQUIBB, MD Follow up.   Specialty: Internal Medicine Why: Hospital follow up Contact information: [address] [place] [address] [phone]                Allergies  Allergen Reactions   Meperidine Other (See Comments)    Other Reaction:  GI Upset Other reaction(s): Other (See Comments) Other reaction(s): Other (See Comments) Other Reaction: GI Upset Other Reaction: GI Upset   Clindamycin    Clindamycin/Lincomycin    Demerol    Sulfa Antibiotics    Codeine     Other reaction(s): Other (See Comments) Other Reaction: GI Upset   Penicillins     Other reaction(s): Other (See Comments) Other Reaction: GI Upset    Consultations: Treatment Team:  Lorelle Hussar, MD   Procedures/Studies: DG Hand Complete Right Result Date: 12/18/2023 CLINICAL DATA:  fall, skin tear to right hand EXAM: RIGHT HAND - COMPLETE 3+ VIEW COMPARISON:  None Available. FINDINGS: Diffuse osteopenia.No acute fracture or dislocation. Severe joint space loss at the base of the thumb. Mild joint space loss of the DIP of the first and second digits. No radiopaque foreign body. IMPRESSION: 1. Diffuse osteopenia.  No acute fracture or dislocation. 2. 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 Electronically Signed   By: Rogelia Myers M.D.   On: 12/18/2023 11:52   CT Thoracic Spine Wo Contrast Result Date: 12/18/2023 CLINICAL DATA:  Back trauma, no prior imaging (Age >= 16y) EXAM: CT THORACIC SPINE WITHOUT CONTRAST TECHNIQUE: Multidetector CT images of the thoracic were obtained using the standard protocol without intravenous contrast. RADIATION DOSE REDUCTION: This exam was performed according to the departmental dose-optimization program which includes automated exposure control, adjustment of the mA and/or kV according to patient size and/or use of iterative reconstruction technique. COMPARISON:  None Available. FINDINGS: Alignment: Mild dextroscoliosis. Slight degenerative anterolisthesis at C7-T1, T2-3 and T3-4. Vertebrae: Mild done written bone deformity of the superior endplate of T4, of uncertain chronicity. There is mild sclerosis of the endplate favoring chronicity. No apparent acute fracture. No osseous lesions are present. Paraspinal and other soft tissues: No paraspinous or epidural hematoma and no soft tissue swelling. Mild fusiform aneurysmal dilatation of the descending thoracic aorta, which measures up to 4.4 x 4.0 cm in cross-sectional diameter. The ascending thoracic aorta is mildly ectatic measuring up to 3.4 cm in diameter. There is mild mosaic attenuation of the lungs. There are hazy and reticular opacities present dependently within the lower lobes bilaterally. There is also mild bronchiolectasis. Disc levels: There is disc space narrowing and endplate ridging present diffusely throughout the thoracic spine. There is no significant disc herniation. There is also no significant spinal canal or neural foraminal stenosis evident. IMPRESSION: 1. Mild downward bowing deformity of the superior endplate of T4, likely chronic. 2. 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 Electronically Signed   By: Evalene Coho M.D.   On:  12/18/2023 10:46   CT Maxillofacial WO CM Result Date: 12/18/2023 CLINICAL DATA:  Facial trauma, blunt EXAM: CT MAXILLOFACIAL WITHOUT CONTRAST TECHNIQUE: Multidetector CT imaging of the maxillofacial structures was performed. Multiplanar CT image reconstructions were also generated. RADIATION DOSE REDUCTION: This exam was performed according to the departmental dose-optimization program which includes automated exposure control, adjustment of the mA and/or kV according to patient size and/or use of iterative reconstruction technique. COMPARISON:  Maxillofacial CT dated December 11, 2019. FINDINGS: Osseous: Chronic blowout fracture of the left orbital floor. No acute facial fracture. No osseous lesions. Numerous dental caries. Moderate bilateral cervical facet arthrosis. Orbits: Mild left enophthalmos secondary to orbital floor fracture. There is orbital fat and the inferior rectus muscle partially protruding through the defect. Status post bilateral lens replacement. Sinuses: Mild polypoid mucosal disease within the floor the left maxillary sinus. Soft tissues: No significant soft tissue swelling  evident. Limited intracranial: Moderate cerebral atrophy. Chronic lacunar infarct within the right basal ganglia. IMPRESSION: 1. No evidence of acute traumatic injury. 2. Chronic blowout fracture of the left inferior orbital wall with associated enophthalmos. Electronically Signed   By: Evalene Coho M.D.   On: 12/18/2023 10:36   CT Lumbar Spine Wo Contrast Result Date: 12/18/2023 CLINICAL DATA:  Back trauma, no prior imaging (Age >= 16y) EXAM: CT LUMBAR SPINE WITHOUT CONTRAST TECHNIQUE: Multidetector CT imaging of the lumbar spine was performed without intravenous contrast administration. Multiplanar CT image reconstructions were also generated. RADIATION DOSE REDUCTION: This exam was performed according to the departmental dose-optimization program which includes automated exposure control, adjustment of the mA  and/or kV according to patient size and/or use of iterative reconstruction technique. COMPARISON:  None Available. FINDINGS: Segmentation: 5 lumbar type vertebrae. Alignment: Slight degenerative retrolisthesis at L2-3 and also slight degenerative anterolisthesis at L4-5. Vertebrae: Vertebral bodies maintain their height. No evidence of fracture or osseous lesion. Paraspinal and other soft tissues: No paraspinous hematoma or soft tissue injury evident. There are simple appearing exophytic cyst arising from the kidneys bilaterally. Moderate atheromatous disease within the abdominal aorta and mild ectasia. Disc levels: L1-2: Minimal disc bulging. No significant spinal canal or neural foraminal stenosis. L2-3: Mild degenerative retrolisthesis. Diffuse posterior disc bulging and moderate bilateral facet arthrosis, resulting in moderate central spinal canal stenosis and bilateral lateral recess stenosis, with questionable impingement of the L3 nerves in the lateral recesses. L3-4: Chronic degenerative disc disease and bilateral facet arthrosis, with moderate to severe central spinal canal stenosis and bilateral lateral recess stenosis. There is likely impingement of the L4 nerves in the lateral recesses. L4-5: Degenerative anterolisthesis with mild diffuse disc bulging and moderate bilateral facet arthrosis. There is thickening and calcification of the ligamentum flavum and moderate to severe central spinal canal stenosis and bilateral lateral recess stenosis. There is likely impingement of the L5 nerves in the lateral recesses. L5-S1: Right posterolateral disc protrusion and diffuse endplate ridging. There is also moderate bilateral facet arthrosis. Moderate central spinal canal stenosis and moderate to severe bilateral neural foraminal stenosis. IMPRESSION: 1. No evidence of acute traumatic injury. 2. Multilevel degenerative disc disease and facet arthrosis with varying central spinal canal stenosis and lateral recess  stenosis with likely impingement of the transiting nerves in the lateral recesses at L2-3, L3-4 and L4-5. Electronically Signed   By: Evalene Coho M.D.   On: 12/18/2023 10:29   CT Head Wo Contrast Result Date: 12/18/2023 CLINICAL DATA:  Head trauma. Un witnessed fall. Small laceration to right side of the head. EXAM: CT HEAD WITHOUT CONTRAST CT CERVICAL SPINE WITHOUT CONTRAST TECHNIQUE: Multidetector CT imaging of the head and cervical spine was performed following the standard protocol without intravenous contrast. Multiplanar CT image reconstructions of the cervical spine were also generated. RADIATION DOSE REDUCTION: This exam was performed according to the departmental dose-optimization program which includes automated exposure control, adjustment of the mA and/or kV according to patient size and/or use of iterative reconstruction technique. COMPARISON:  04/15/2023 FINDINGS: CT HEAD FINDINGS Brain: No evidence of acute infarction, hemorrhage, hydrocephalus, extra-axial collection or mass lesion/mass effect. Bilateral remote basal ganglia lacunar infarcts. There is moderate diffuse low-attenuation within the subcortical and periventricular white matter compatible with chronic microvascular disease. Increased volume of the sulci and ventricles compatible with atrophy. Vascular: No hyperdense vessel or unexpected calcification. Skull: Negative for acute fracture or focal lesion. Remote floor of left orbit fracture. Sinuses/Orbits: No acute abnormality. Other: Mild contusion  along the right frontoparietal scalp without underlying skull fracture, image 22/4. CT CERVICAL SPINE FINDINGS Alignment: No acute posttraumatic malalignment of the cervical spine. As noted on the previous exam there is 1-2 mm anterolisthesis of C3 on C4 and 3 mm anterolisthesis of C4 on C5. Skull base and vertebrae: No acute fracture or subluxation. Soft tissues and spinal canal: No prevertebral fluid or swelling. No visible canal  hematoma. Disc levels: Multilevel disc space narrowing with ventral endplate spurring. This is most severe at the C5-6 and C6-7 levels. Bilateral facet arthropathy noted. Upper chest: Negative. Other: Bilateral carotid artery calcifications. IMPRESSION: 1. No acute intracranial abnormalities. 2. Mild contusion along the right frontoparietal scalp without underlying skull fracture. 3. Chronic microvascular disease and atrophy. 4. No evidence for cervical spine fracture or subluxation. 5. 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 Electronically Signed   By: Waddell Calk M.D.   On: 12/18/2023 10:23   CT Cervical Spine Wo Contrast Result Date: 12/18/2023 CLINICAL DATA:  Head trauma. Un witnessed fall. Small laceration to right side of the head. EXAM: CT HEAD WITHOUT CONTRAST CT CERVICAL SPINE WITHOUT CONTRAST TECHNIQUE: Multidetector CT imaging of the head and cervical spine was performed following the standard protocol without intravenous contrast. Multiplanar CT image reconstructions of the cervical spine were also generated. RADIATION DOSE REDUCTION: This exam was performed according to the departmental dose-optimization program which includes automated exposure control, adjustment of the mA and/or kV according to patient size and/or use of iterative reconstruction technique. COMPARISON:  04/15/2023 FINDINGS: CT HEAD FINDINGS Brain: No evidence of acute infarction, hemorrhage, hydrocephalus, extra-axial collection or mass lesion/mass effect. Bilateral remote basal ganglia lacunar infarcts. There is moderate diffuse low-attenuation within the subcortical and periventricular white matter compatible with chronic microvascular disease. Increased volume of the sulci and ventricles compatible with atrophy. Vascular: No hyperdense vessel or unexpected calcification. Skull: Negative for acute fracture or focal lesion. Remote floor of left orbit fracture. Sinuses/Orbits: No acute abnormality.  Other: Mild contusion along the right frontoparietal scalp without underlying skull fracture, image 22/4. CT CERVICAL SPINE FINDINGS Alignment: No acute posttraumatic malalignment of the cervical spine. As noted on the previous exam there is 1-2 mm anterolisthesis of C3 on C4 and 3 mm anterolisthesis of C4 on C5. Skull base and vertebrae: No acute fracture or subluxation. Soft tissues and spinal canal: No prevertebral fluid or swelling. No visible canal hematoma. Disc levels: Multilevel disc space narrowing with ventral endplate spurring. This is most severe at the C5-6 and C6-7 levels. Bilateral facet arthropathy noted. Upper chest: Negative. Other: Bilateral carotid artery calcifications. IMPRESSION: 1. No acute intracranial abnormalities. 2. Mild contusion along the right frontoparietal scalp without underlying skull fracture. 3. Chronic microvascular disease and atrophy. 4. No evidence for cervical spine fracture or subluxation. 5. 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 Electronically Signed   By: Waddell Calk M.D.   On: 12/18/2023 10:23   DG Shoulder Right Result Date: 12/18/2023 CLINICAL DATA:  Unwitnessed fall. EXAM: RIGHT SHOULDER - 2+ VIEW COMPARISON:  None Available. FINDINGS: Acute fracture deformity is seen involving the greater tubercle and surgical neck of the proximal right humerus. There is no evidence of dislocation. Mild degenerative changes are seen involving the right acromioclavicular joint and right glenohumeral articulation. Soft tissues are unremarkable. IMPRESSION: Acute fracture of the proximal right humerus. Electronically Signed   By: Suzen Dials M.D.   On: 12/18/2023 09:38   DG Shoulder Left Result Date: 12/18/2023 CLINICAL DATA:  Unwitnessed fall.  EXAM: LEFT SHOULDER - 2+ VIEW COMPARISON:  None Available. FINDINGS: There is no evidence of fracture or dislocation. Mild degenerative changes seen involving the left acromioclavicular joint and  left glenohumeral articulation. Soft tissues are unremarkable. IMPRESSION: Mild degenerative changes without evidence of an acute osseous abnormality. Electronically Signed   By: Suzen Dials M.D.   On: 12/18/2023 09:37   DG Elbow Complete Right Result Date: 12/18/2023 CLINICAL DATA:  Status post fall. EXAM: RIGHT ELBOW - COMPLETE 3+ VIEW COMPARISON:  None Available. FINDINGS: There is no evidence of fracture, dislocation, or joint effusion. Mild chronic changes are seen involving the medial and lateral condyles of the distal right humerus. Soft tissues are unremarkable. IMPRESSION: 1. No acute osseous abnormality. Electronically Signed   By: Suzen Dials M.D.   On: 12/18/2023 09:35   DG Elbow Complete Left Result Date: 12/18/2023 CLINICAL DATA:  Unwitnessed fall. EXAM: LEFT ELBOW - COMPLETE 3+ VIEW COMPARISON:  None Available. FINDINGS: There is no evidence of fracture, dislocation, or joint effusion. There is no evidence of arthropathy or other focal bone abnormality. Soft tissues are unremarkable. IMPRESSION: Negative. Electronically Signed   By: Suzen Dials M.D.   On: 12/18/2023 09:34   DG Pelvis 1-2 Views Result Date: 12/18/2023 CLINICAL DATA:  Status post fall. EXAM: PELVIS - 1-2 VIEW COMPARISON:  None Available. FINDINGS: There is no evidence of pelvic fracture or diastasis. No pelvic bone lesions are seen. IMPRESSION: Negative. Electronically Signed   By: Suzen Dials M.D.   On: 12/18/2023 09:33      Discharge Exam: Vitals:   12/21/23 0452 12/21/23 0747  BP:  (!) 182/93  Pulse:  64  Resp:  16  Temp:  98.6 F (37 C)  SpO2: 97% 96%   Vitals:   12/20/23 2013 12/21/23 0430 12/21/23 0452 12/21/23 0747  BP: 108/87 (!) 166/90  (!) 182/93  Pulse: 66 (!) 56  64  Resp: 18 18  16   Temp: 98.2 F (36.8 C) 97.6 F (36.4 C)  98.6 F (37 C)  TempSrc:  Oral    SpO2: 93% (!) 81% 97% 96%  Weight:      Height:        General exam: Appears calm and comfortable, NAD   Respiratory system: No work of breathing, symmetric chest wall expansion Cardiovascular system: S1 & S2 heard, RRR.  Gastrointestinal system: Abdomen is nondistended, soft and nontender.  Neuro: Opens eyes, laughs inappropriately, does not answer questions or follow commands Extremities: Right arm in sling Skin: Thin skin, some skin tears appreciated     The results of significant diagnostics from this hospitalization (including imaging, microbiology, ancillary and laboratory) are listed below for reference.     Microbiology: No results found for this or any previous visit (from the past 240 hours).   Labs: BNP (last 3 results) Recent Labs    12/18/23 0931  BNP 157.2*   Basic Metabolic Panel: Recent Labs  Lab 12/18/23 0931 12/19/23 0748  NA 141 143  K 3.7 3.7  CL 105 108  CO2 27 24  GLUCOSE 114* 97  BUN 32* 27*  CREATININE 1.21* 1.06*  CALCIUM 9.7 9.4   Liver Function Tests: Recent Labs  Lab 12/18/23 0931 12/19/23 0748  AST 21 22  ALT 13 12  ALKPHOS 63 60  BILITOT 0.7 1.0  PROT 6.9 6.8  ALBUMIN 3.8 3.7   Recent Labs  Lab 12/18/23 0931  LIPASE 38   No results for input(s): AMMONIA in the last 168  hours. CBC: Recent Labs  Lab 12/18/23 0931 12/19/23 0748  WBC 10.0 6.9  NEUTROABS 8.3*  --   HGB 10.7* 10.5*  HCT 33.5* 33.0*  MCV 90.8 89.4  PLT 237 200   Cardiac Enzymes: Recent Labs  Lab 12/18/23 0931  CKTOTAL 94   BNP: Invalid input(s): POCBNP CBG: No results for input(s): GLUCAP in the last 168 hours. D-Dimer No results for input(s): DDIMER in the last 72 hours. Hgb A1c No results for input(s): HGBA1C in the last 72 hours. Lipid Profile No results for input(s): CHOL, HDL, LDLCALC, TRIG, CHOLHDL, LDLDIRECT in the last 72 hours. Thyroid  function studies No results for input(s): TSH, T4TOTAL, T3FREE, THYROIDAB in the last 72 hours.  Invalid input(s): FREET3 Anemia work up No results for input(s):  VITAMINB12, FOLATE, FERRITIN, TIBC, IRON, RETICCTPCT in the last 72 hours. Urinalysis    Component Value Date/Time   COLORURINE YELLOW (A) 12/18/2023 0810   APPEARANCEUR CLOUDY (A) 12/18/2023 0810   LABSPEC 1.012 12/18/2023 0810   PHURINE 7.0 12/18/2023 0810   GLUCOSEU NEGATIVE 12/18/2023 0810   HGBUR NEGATIVE 12/18/2023 0810   BILIRUBINUR NEGATIVE 12/18/2023 0810   BILIRUBINUR neg 03/02/2014 1700   KETONESUR NEGATIVE 12/18/2023 0810   PROTEINUR NEGATIVE 12/18/2023 0810   UROBILINOGEN negative 03/02/2014 1700   NITRITE NEGATIVE 12/18/2023 0810   LEUKOCYTESUR LARGE (A) 12/18/2023 0810   Sepsis Labs Recent Labs  Lab 12/18/23 0931 12/19/23 0748  WBC 10.0 6.9   Microbiology No results found for this or any previous visit (from the past 240 hours).   Time coordinating discharge: 32 min   SIGNED: Lorane Poland, MD  [facility] 12/21/2023, 1:21 PM Pager   If 7PM-7AM, please contact night-coverage

## 2023-12-21 NOTE — TOC Transition Note (Signed)
 Transition of Care [facility]) - Discharge Note   Patient Details  Name: JAQUETTA CURRIER MRN: [id number] Date of Birth: 04/15/1941  Transition of Care [facility]) CM/SW Contact:  Marinda Cooks, RN Phone Number: 12/21/2023, 3:58 PM   Clinical Narrative:    This CM updated by covering MD pt medically cleared to dc today and has active DC order . This CM spoke with Admission POC Linda at [facility]  in the Memory Care Unit faxed over clinicals and confirmed pt can return back to her bed in rm 208 . DC transportation confirmed for [facility] with [facility] updated . No additional DC needs requested by medical team or identified by CM at this time .    Final next level of care: Memory Care Barriers to Discharge: No Barriers Identified   Name of family member notified: Daughters Jenkins & Lorri Patient and family notified of of transfer: 12/21/23  Social Drivers of Health (SDOH) Interventions SDOH Screenings   Food Insecurity: No Food Insecurity (12/18/2023)  Housing: Low Risk  (12/18/2023)  Transportation Needs: No Transportation Needs (12/18/2023)  Utilities: Not At Risk (12/18/2023)  Social Connections: Patient Unable To Answer (12/18/2023)  Tobacco Use: Medium Risk (12/18/2023)     Readmission Risk Interventions     No data to display

## 2023-12-21 NOTE — Plan of Care (Signed)
  Problem: Clinical Measurements: Goal: Ability to maintain clinical measurements within normal limits will improve Outcome: Progressing Goal: Will remain free from infection Outcome: Progressing Goal: Diagnostic test results will improve Outcome: Progressing Goal: Respiratory complications will improve Outcome: Progressing Goal: Cardiovascular complication will be avoided Outcome: Progressing   Problem: Elimination: Goal: Will not experience complications related to bowel motility Outcome: Progressing Goal: Will not experience complications related to urinary retention Outcome: Progressing   Problem: Pain Managment: Goal: General experience of comfort will improve and/or be controlled Outcome: Progressing

## 2024-02-26 ENCOUNTER — Inpatient Hospital Stay
Admission: EM | Admit: 2024-02-26 | Discharge: 2024-03-03 | DRG: 481 | Disposition: A | Discharge status: Skilled Nursing Facility | Source: Skilled Nursing Facility | Attending: Internal Medicine | Admitting: Internal Medicine

## 2024-02-26 ENCOUNTER — Emergency Department

## 2024-02-26 DIAGNOSIS — S72001A Fracture of unspecified part of neck of right femur, initial encounter for closed fracture: Secondary | ICD-10-CM | POA: Diagnosis not present

## 2024-02-26 DIAGNOSIS — D509 Iron deficiency anemia, unspecified: Secondary | ICD-10-CM | POA: Diagnosis present

## 2024-02-26 DIAGNOSIS — Z9181 History of falling: Secondary | ICD-10-CM | POA: Diagnosis not present

## 2024-02-26 DIAGNOSIS — Z87440 Personal history of urinary (tract) infections: Secondary | ICD-10-CM | POA: Diagnosis not present

## 2024-02-26 DIAGNOSIS — E876 Hypokalemia: Secondary | ICD-10-CM | POA: Diagnosis present

## 2024-02-26 DIAGNOSIS — Z66 Do not resuscitate: Secondary | ICD-10-CM | POA: Diagnosis present

## 2024-02-26 DIAGNOSIS — Z9049 Acquired absence of other specified parts of digestive tract: Secondary | ICD-10-CM

## 2024-02-26 DIAGNOSIS — F03C11 Unspecified dementia, severe, with agitation: Secondary | ICD-10-CM | POA: Diagnosis not present

## 2024-02-26 DIAGNOSIS — F03C0 Unspecified dementia, severe, without behavioral disturbance, psychotic disturbance, mood disturbance, and anxiety: Secondary | ICD-10-CM

## 2024-02-26 DIAGNOSIS — K0889 Other specified disorders of teeth and supporting structures: Secondary | ICD-10-CM | POA: Diagnosis present

## 2024-02-26 DIAGNOSIS — F32A Depression, unspecified: Secondary | ICD-10-CM | POA: Diagnosis present

## 2024-02-26 DIAGNOSIS — Z604 Social exclusion and rejection: Secondary | ICD-10-CM | POA: Diagnosis present

## 2024-02-26 DIAGNOSIS — F03C4 Unspecified dementia, severe, with anxiety: Secondary | ICD-10-CM | POA: Diagnosis present

## 2024-02-26 DIAGNOSIS — Z9889 Other specified postprocedural states: Secondary | ICD-10-CM | POA: Diagnosis not present

## 2024-02-26 DIAGNOSIS — Z7982 Long term (current) use of aspirin: Secondary | ICD-10-CM | POA: Diagnosis not present

## 2024-02-26 DIAGNOSIS — E785 Hyperlipidemia, unspecified: Secondary | ICD-10-CM | POA: Diagnosis present

## 2024-02-26 DIAGNOSIS — Z87891 Personal history of nicotine dependence: Secondary | ICD-10-CM

## 2024-02-26 DIAGNOSIS — S72011A Unspecified intracapsular fracture of right femur, initial encounter for closed fracture: Secondary | ICD-10-CM | POA: Diagnosis present

## 2024-02-26 DIAGNOSIS — F03C3 Unspecified dementia, severe, with mood disturbance: Secondary | ICD-10-CM | POA: Diagnosis present

## 2024-02-26 DIAGNOSIS — S72002A Fracture of unspecified part of neck of left femur, initial encounter for closed fracture: Secondary | ICD-10-CM | POA: Diagnosis present

## 2024-02-26 DIAGNOSIS — Z90710 Acquired absence of both cervix and uterus: Secondary | ICD-10-CM

## 2024-02-26 DIAGNOSIS — I10 Essential (primary) hypertension: Secondary | ICD-10-CM | POA: Diagnosis present

## 2024-02-26 DIAGNOSIS — G308 Other Alzheimer's disease: Secondary | ICD-10-CM | POA: Diagnosis not present

## 2024-02-26 DIAGNOSIS — Z79899 Other long term (current) drug therapy: Secondary | ICD-10-CM

## 2024-02-26 DIAGNOSIS — D62 Acute posthemorrhagic anemia: Secondary | ICD-10-CM | POA: Diagnosis not present

## 2024-02-26 DIAGNOSIS — F419 Anxiety disorder, unspecified: Secondary | ICD-10-CM | POA: Diagnosis present

## 2024-02-26 DIAGNOSIS — Z880 Allergy status to penicillin: Secondary | ICD-10-CM

## 2024-02-26 DIAGNOSIS — Z8249 Family history of ischemic heart disease and other diseases of the circulatory system: Secondary | ICD-10-CM | POA: Diagnosis not present

## 2024-02-26 DIAGNOSIS — Z882 Allergy status to sulfonamides status: Secondary | ICD-10-CM

## 2024-02-26 DIAGNOSIS — W19XXXA Unspecified fall, initial encounter: Secondary | ICD-10-CM | POA: Diagnosis present

## 2024-02-26 DIAGNOSIS — R413 Other amnesia: Secondary | ICD-10-CM | POA: Diagnosis present

## 2024-02-26 DIAGNOSIS — Z90722 Acquired absence of ovaries, bilateral: Secondary | ICD-10-CM

## 2024-02-26 DIAGNOSIS — Y92193 Bedroom in other specified residential institution as the place of occurrence of the external cause: Secondary | ICD-10-CM | POA: Diagnosis not present

## 2024-02-26 DIAGNOSIS — F02C3 Dementia in other diseases classified elsewhere, severe, with mood disturbance: Secondary | ICD-10-CM | POA: Diagnosis not present

## 2024-02-26 DIAGNOSIS — R911 Solitary pulmonary nodule: Secondary | ICD-10-CM | POA: Diagnosis present

## 2024-02-26 DIAGNOSIS — Z85828 Personal history of other malignant neoplasm of skin: Secondary | ICD-10-CM | POA: Diagnosis not present

## 2024-02-26 DIAGNOSIS — Z888 Allergy status to other drugs, medicaments and biological substances status: Secondary | ICD-10-CM

## 2024-02-26 DIAGNOSIS — Z885 Allergy status to narcotic agent status: Secondary | ICD-10-CM

## 2024-02-26 LAB — COMPREHENSIVE METABOLIC PANEL WITH GFR
ALT: 13 U/L (ref 0–44)
AST: 26 U/L (ref 15–41)
Albumin: 3.7 g/dL (ref 3.5–5.0)
Alkaline Phosphatase: 58 U/L (ref 38–126)
Anion gap: 13 (ref 5–15)
BUN: 23 mg/dL (ref 8–23)
CO2: 25 mmol/L (ref 22–32)
Calcium: 9.6 mg/dL (ref 8.9–10.3)
Chloride: 100 mmol/L (ref 98–111)
Creatinine, Ser: 1.07 mg/dL — ABNORMAL HIGH (ref 0.44–1.00)
GFR, Estimated: 51 mL/min — ABNORMAL LOW (ref >60)
Glucose, Bld: 130 mg/dL — ABNORMAL HIGH (ref 70–99)
Potassium: 4 mmol/L (ref 3.5–5.1)
Sodium: 138 mmol/L (ref 135–145)
Total Bilirubin: 0.6 mg/dL (ref 0.0–1.2)
Total Protein: 6.7 g/dL (ref 6.5–8.1)

## 2024-02-26 LAB — CK: Total CK: 42 U/L (ref 38–234)

## 2024-02-26 LAB — CBC WITH DIFFERENTIAL/PLATELET
Abs Immature Granulocytes: 0.05 K/uL (ref 0.00–0.07)
Basophils Absolute: 0.1 K/uL (ref 0.0–0.1)
Basophils Relative: 1 %
Eosinophils Absolute: 0.2 K/uL (ref 0.0–0.5)
Eosinophils Relative: 2 %
HCT: 32.5 % — ABNORMAL LOW (ref 36.0–46.0)
Hemoglobin: 10.5 g/dL — ABNORMAL LOW (ref 12.0–15.0)
Immature Granulocytes: 1 %
Lymphocytes Relative: 9 %
Lymphs Abs: 0.9 K/uL (ref 0.7–4.0)
MCH: 30.1 pg (ref 26.0–34.0)
MCHC: 32.3 g/dL (ref 30.0–36.0)
MCV: 93.1 fL (ref 80.0–100.0)
Monocytes Absolute: 1 K/uL (ref 0.1–1.0)
Monocytes Relative: 9 %
Neutro Abs: 8.8 K/uL — ABNORMAL HIGH (ref 1.7–7.7)
Neutrophils Relative %: 78 %
Platelets: 204 K/uL (ref 150–400)
RBC: 3.49 MIL/uL — ABNORMAL LOW (ref 3.87–5.11)
RDW: 17.4 % — ABNORMAL HIGH (ref 11.5–15.5)
WBC: 11 K/uL — ABNORMAL HIGH (ref 4.0–10.5)
nRBC: 0 % (ref 0.0–0.2)

## 2024-02-26 LAB — PROTIME-INR
INR: 1.1 (ref 0.8–1.2)
Prothrombin Time: 15 s (ref 11.4–15.2)

## 2024-02-26 LAB — TYPE AND SCREEN
ABO/RH(D): A POS
Antibody Screen: NEGATIVE

## 2024-02-26 MED ORDER — MORPHINE SULFATE (PF) 4 MG/ML IV SOLN
4.0000 mg | Freq: Once | INTRAVENOUS | Status: AC
  Administered 2024-02-26: 4 mg via INTRAVENOUS
  Filled 2024-02-26: qty 1

## 2024-02-26 MED ORDER — ONDANSETRON HCL 4 MG/2ML IJ SOLN
4.0000 mg | Freq: Four times a day (QID) | INTRAMUSCULAR | Status: DC | PRN
Start: 2024-02-26 — End: 2024-02-27

## 2024-02-26 MED ORDER — HYDROMORPHONE HCL 1 MG/ML IJ SOLN
0.5000 mg | INTRAMUSCULAR | Status: DC | PRN
  Administered 2024-02-27: 0.5 mg via INTRAVENOUS
  Filled 2024-02-26: qty 0.5

## 2024-02-26 MED ORDER — ONDANSETRON HCL 4 MG PO TABS: 4.0000 mg | ORAL_TABLET | Freq: Four times a day (QID) | ORAL | Status: DC | PRN

## 2024-02-26 MED ORDER — DEXTROSE IN LACTATED RINGERS 5 % IV SOLN: INTRAVENOUS | Status: DC

## 2024-02-26 MED ORDER — ONDANSETRON HCL 4 MG/2ML IJ SOLN
4.0000 mg | Freq: Once | INTRAMUSCULAR | Status: AC
  Administered 2024-02-26: 4 mg via INTRAVENOUS
  Filled 2024-02-26: qty 2

## 2024-02-26 MED ORDER — SODIUM CHLORIDE 0.9 % IV BOLUS
500.0000 mL | Freq: Once | INTRAVENOUS | Status: AC
  Administered 2024-02-26: 500 mL via INTRAVENOUS

## 2024-02-26 NOTE — Progress Notes (Signed)
Full consult note and discussion with patient to follow tomorrow.  Called by ED staff. Imaging reviewed.  - Plan for surgery tomorrow, likely afternoon.  - NPO after midnight - Hold anticoagulation - Admit to Hospitalist team.  

## 2024-02-26 NOTE — H&P (Signed)
 History and Physical    Patient: Pam Keller FMW:[id number] DOB: June 05, 1941 DOA: 02/26/2024 DOS: the patient was seen and examined on 02/26/2024 PCP: Pia Kerney SQUIBB, MD  Patient coming from: SNF  Chief Complaint:  Chief Complaint  Patient presents with   Fall   HPI: Pam Keller is a 83 y.o. female with medical history significant of dementia, hyperlipidemia, basal cell carcinoma, essential hypertension, history of pulmonary nodule, who was brought in from [facility] skilled facility after sustaining a mechanical fall.  Patient resides in the memory care unit over there.  She has right hip pain.  She normally ambulates but suspected to have the unwitnessed fall.  No one knows how long she was down.  Patient is accompanied by the daughter here.  She has had previous fall with right humeral fracture from June of this year which is healing.  She is apparently a DNI DNR at some point.  Not on any blood thinners.  Family want patient to proceed with surgery because she has been able to walk.  Dr. Tobie orthopedics consulted and will do surgery tomorrow.  We are admitting patient to the medical service.  Review of Systems: As mentioned in the history of present illness. All other systems reviewed and are negative. Past Medical History:  Diagnosis Date   Basal cell carcinoma    Dementia (HCC)    Hyperlipidemia    Hypertension    Microscopic hematuria 06/24/2008   negative workuo   Pulmonary nodule    Past Surgical History:  Procedure Laterality Date   ABDOMINAL HYSTERECTOMY  1984   menorrhagia   APPENDECTOMY  1955   for rupture appx   BREAST BIOPSY     benign   CARPAL TUNNEL RELEASE     FOOT SURGERY     macular surgery     [facility], repaired   MOHS SURGERY  2012   for basal cell, forehead   OOPHORECTOMY  1960   TRIGGER FINGER RELEASE  2004   [facility]   Social History:  reports that she quit smoking about 16 years ago. Her smoking use included cigarettes. She started smoking  about 64 years ago. She has a 24 pack-year smoking history. She has never used smokeless tobacco. She reports current alcohol use. She reports that she does not use drugs.  Allergies  Allergen Reactions   Meperidine Other (See Comments)    Other Reaction: GI Upset Other reaction(s): Other (See Comments) Other reaction(s): Other (See Comments) Other Reaction: GI Upset Other Reaction: GI Upset   Clindamycin    Clindamycin/Lincomycin    Demerol    Sulfa Antibiotics    Codeine     Other reaction(s): Other (See Comments) Other Reaction: GI Upset   Penicillins     Other reaction(s): Other (See Comments) Other Reaction: GI Upset    Family History  Problem Relation Age of Onset   Hypertension Father    Cancer Neg Hx     Prior to Admission medications   Medication Sig Start Date End Date Taking? Authorizing Provider  allopurinol  (ZYLOPRIM ) 100 MG tablet Take 100 mg by mouth daily. 12/15/23   [provider]  aspirin  81 MG chewable tablet Chew 325 mg by mouth daily.    [provider]  atorvastatin  (LIPITOR) 40 MG tablet Take 40 mg by mouth daily. 12/15/23   [provider]  busPIRone  (BUSPAR ) 10 MG tablet Take 10 mg by mouth 2 (two) times daily. 02/04/20   [provider]  cholecalciferol (  VITAMIN D ) 25 MCG (1000 UNIT) tablet Take by mouth. 12/15/19   [provider]  donepezil  (ARICEPT ) 5 MG tablet TAKE 1 TABLET (5 MG TOTAL) BY MOUTH AT BEDTIME. 05/27/16   Bertrum Charlie CROME, MD  Ferrous Sulfate (IRON) 325 (65 Fe) MG TABS Take by mouth. 12/15/19   [provider]  hydrALAZINE  (APRESOLINE ) 25 MG tablet Take 25 mg by mouth 2 (two) times daily. 12/15/23   [provider]  LORazepam  (ATIVAN ) 0.5 MG tablet Take by mouth. 02/04/20   [provider]  losartan  (COZAAR ) 100 MG tablet Take 100 mg by mouth daily. 02/04/20   [provider]  metoprolol  succinate (TOPROL -XL) 25 MG 24 hr tablet Take 50 mg by mouth daily.  02/04/20   [provider]  sertraline  (ZOLOFT ) 100 MG tablet Take 50 mg by mouth daily.    [provider]  Skin Protectants, Misc. (MINERIN CREME) CREA  12/15/19   [provider]  SODIUM FLUORIDE 5000 PLUS 1.1 % CREA dental cream 1 Application 2 (two) times daily. 1 Application Topical Twice Daily 07/23/23   [provider]  traZODone  (DESYREL ) 50 MG tablet Take by mouth. 12/15/23   [provider]    Physical Exam: Vitals:   02/26/24 1951  BP: (!) 151/97  Pulse: 88  Resp: 20  Temp: 98.6 F (37 C)  TempSrc: Oral  SpO2: 97%   Constitutional: Confused, acutely ill looking, Eyes: PERRL, lids and conjunctivae normal ENMT: Mucous membranes are moist. Posterior pharynx clear of any exudate or lesions.Normal dentition.  Neck: normal, supple, no masses, no thyromegaly Respiratory: clear to auscultation bilaterally, no wheezing, no crackles. Normal respiratory effort. No accessory muscle use.  Cardiovascular: Regular rate and rhythm, no murmurs / rubs / gallops. No extremity edema. 2+ pedal pulses. No carotid bruits.  Abdomen: no tenderness, no masses palpated. No hepatosplenomegaly. Bowel sounds positive.  Musculoskeletal: Right lower extremity mildly rotated laterally, tender to touch and bruised, Skin: no rashes, lesions, ulcers. No induration Neurologic: CN 2-12 grossly intact. Sensation intact, DTR normal. Strength 5/5 in all 4.  Psychiatric: Confused, advanced dementia normal mood  Data Reviewed:  Blood pressure 150/97, white count 11, hemoglobin 10.5, creatinine 1.07, glucose 130 INR 1.1.CT cervical spine and head CT without contrast showed no acute findings.  X-ray of the left hip showed no acute findings.  X-ray of the right shoulder showed age-indeterminate nondisplaced fracture of the humeral head.  Chest x-ray showed no active disease.  X-ray of the right hip showed acute impacted right subcapital femoral neck fracture.  Also right hip  subcutaneous soft tissue hematoma  Assessment and Plan:  #1 right subcapsular nondisplaced femoral neck fracture: Patient will be admitted for surgical repair.  Pain control.  No anticoagulation.  Supportive care.  Orthopedics is do surgery tomorrow per family's wishes.  #2 advanced dementia: No agitation.  Will confirm continue home regimen.  #3 essential hypertension: We will continue with home regimen.  Patient will be n.p.o. after midnight.  #4 hyperlipidemia: Continue with statin.  #5 anxiety with depression: Confirmed on continue home regimen.    Advance Care Planning:   Code Status: Prior DNR  Consults: Dr. Tobie, orthopedics  Family Communication: Daughter at bedside  Severity of Illness: The appropriate patient status for this patient is INPATIENT. Inpatient status is judged to be reasonable and necessary in order to provide the required intensity of service to ensure the patient's safety. The patient's presenting symptoms, physical exam findings, and initial radiographic and laboratory  data in the context of their chronic comorbidities is felt to place them at high risk for further clinical deterioration. Furthermore, it is not anticipated that the patient will be medically stable for discharge from the hospital within 2 midnights of admission.   * I certify that at the point of admission it is my clinical judgment that the patient will require inpatient hospital care spanning beyond 2 midnights from the point of admission due to high intensity of service, high risk for further deterioration and high frequency of surveillance required.*  AuthorBETHA SIM KNOLL, MD 02/26/2024 11:34 PM  For on call review [email or web address].

## 2024-02-26 NOTE — ED Triage Notes (Signed)
 Chief Complaint  Patient presents with   Fall   Pt fell this morning at nursing facility [facility]. Pt is on memory care unit. Facility noted to have pt in wheelchair upon arrival. Pt c/o right hip pain.

## 2024-02-26 NOTE — ED Provider Notes (Signed)
 [facility] Provider Note    Event Date/Time   First MD Initiated Contact with Patient 02/26/24 1941     (approximate)   History   Fall   HPI  Pam Keller is a 83 y.o. female  with advanced dementia, hyperlipidemia, hypertension, history of basal cell carcinoma, who presents from her memory care center after an unwitnessed fall.  It was unknown how long she was down for.  Patient normally ambulates but has not been able to bear weight since the incident.  History is limited but per EMS patient is at her mental baseline.  Daughter arrives at bedside shortly after patient's arrival and tells me that patient fell on her right side.  She does have a history of a right humerus fracture as well that was sustained after a fall in June.  Walking is important to the patient's quality of life and even though that the patient is DNR/DNI they would want to undergo surgery if it would mean that she would be able to continue to walk.  Patient is not on any blood thinners      Physical Exam   Triage Vital Signs: ED Triage Vitals  Encounter Vitals Group     BP      Girls Systolic BP Percentile      Girls Diastolic BP Percentile      Boys Systolic BP Percentile      Boys Diastolic BP Percentile      Pulse      Resp      Temp      Temp src      SpO2      Weight      Height      Head Circumference      Peak Flow      Pain Score      Pain Loc      Pain Education      Exclude from Growth Chart     Most recent vital signs: Vitals:   02/26/24 1951  BP: (!) 151/97  Pulse: 88  Resp: 20  Temp: 98.6 F (37 C)  SpO2: 97%    Nursing Triage Note reviewed. Vital signs reviewed and patients oxygen saturation is normoxic  General: Patient is thin, well developed, awake and alert, resting comfortably in no acute distress Head: Normocephalic, no scalp abrasions or lacerations Eyes: Normal inspection, extraocular muscles intact, no conjunctival pallor Ear,  nose, throat: Normal external exam Neck: Normal range of motion, no obvious C-spine tenderness to palpation Respiratory: Patient is in no respiratory distress, lungs CTAB No chest tenderness to palpation Cardiovascular: Patient is not tachycardic, RR GI: Abd SNT with no guarding or rebound  Back: Normal inspection of the back with good strength and range of motion throughout all ext No T or L-spine tenderness to palpation Extremities: pulses intact with good cap refills, no LE pitting edema or calf tenderness Right lower extremity with ecchymosis over right hip and hip is internally rotated Neuro: The patient is alert and oriented to person, not to place or time,  with 5/5 bilat UE/LE strength, no gross motor or sensory defects noted.    ED Results / Procedures / Treatments   Labs (all labs ordered are listed, but only abnormal results are displayed) Labs Reviewed  CBC WITH DIFFERENTIAL/PLATELET - Abnormal; Notable for the following components:      Result Value   WBC 11.0 (*)    RBC 3.49 (*)    Hemoglobin 10.5 (*)  HCT 32.5 (*)    RDW 17.4 (*)    Neutro Abs 8.8 (*)    All other components within normal limits  COMPREHENSIVE METABOLIC PANEL WITH GFR - Abnormal; Notable for the following components:   Glucose, Bld 130 (*)    Creatinine, Ser 1.07 (*)    GFR, Estimated 51 (*)    All other components within normal limits  CK  PROTIME-INR  URINALYSIS, ROUTINE W REFLEX MICROSCOPIC  CBC  COMPREHENSIVE METABOLIC PANEL WITH GFR  TYPE AND SCREEN     EKG EKG and rhythm strip are interpreted by myself:   EKG: [Normal sinus rhythm] at heart rate of 73, normal QRS duration, QTc 465, nonspecific ST segments and T waves no ectopy EKG not consistent with Acute STEMI Rhythm strip: NSR in lead II   RADIOLOGY CT head: No acute abnormality CT-Cspine: No acute abnormality Right hip: Femoral neck fracture on my independent review and interpretation radiologist reads just as:  nondisplaced fracture of the right femoral head/neck  Right shoulder: Age-indeterminate fracture CT hip: pending    PROCEDURES:  Critical Care performed: No  Procedures   MEDICATIONS ORDERED IN ED: Medications  dextrose  5 % in lactated ringers  infusion (has no administration in time range)  HYDROmorphone  (DILAUDID ) injection 0.5 mg (has no administration in time range)  ondansetron  (ZOFRAN ) tablet 4 mg (has no administration in time range)    Or  ondansetron  (ZOFRAN ) injection 4 mg (has no administration in time range)  morphine  (PF) 4 MG/ML injection 4 mg (4 mg Intravenous Given 02/26/24 2044)  ondansetron  (ZOFRAN ) injection 4 mg (4 mg Intravenous Given 02/26/24 2044)  sodium chloride  0.9 % bolus 500 mL (0 mLs Intravenous Stopped 02/26/24 2332)     IMPRESSION / MDM / ASSESSMENT AND PLAN / ED COURSE                                Differential diagnosis includes, but is not limited to, right hip fracture, intracranial hemorrhage, spine fracture, UTI, electrolyte derangement anemia   ED course: Patient presents and appears to be at her mental baseline.  Given that this was an unwitnessed fall CT head and C-spine were obtained which demonstrate no intracranial hemorrhage or cervical spine fracture.  X-ray of the right hip demonstrated nondisplaced femoral neck fracture.  Case discussed with Dr. Tobie who plans to repair this tomorrow and family in agreement.  Patient does have a history of falls and her last fall was secondary to a UTI so this is pending as well.  Will discuss case for admission with the hospitalist   Clinical Course as of 02/26/24 2350  Thu Feb 26, 2024  2112 CT Head Wo Contrast No acute fractures [HD]  2130 I reviewed the hip x-ray and does appear to be broken.  Will reach out to orthopedics [HD]  2207 Case discussed with Dr. Tobie.  Patient to be n.p.o. at midnight and plan for operative repair in the morning.  Does request the CT of the right hip.  Family updated  at bedside [HD]    Clinical Course User Index [HD] Nicholaus Rolland BRAVO, MD     FINAL CLINICAL IMPRESSION(S) / ED DIAGNOSES   Final diagnoses:  Fall, initial encounter  Closed fracture of right hip, initial encounter [facility])     Rx / DC Orders   ED Discharge Orders     None        Note:  This document was prepared using Dragon voice recognition software and may include unintentional dictation errors.   Nicholaus Rolland BRAVO, MD 02/26/24 2350

## 2024-02-26 NOTE — ED Notes (Signed)
 Pts family reports they do not want pt to be in and out cathed. Purewick placed on pt at this time.

## 2024-02-27 ENCOUNTER — Other Ambulatory Visit: Payer: Self-pay

## 2024-02-27 ENCOUNTER — Encounter: Payer: Self-pay | Admitting: Internal Medicine

## 2024-02-27 ENCOUNTER — Inpatient Hospital Stay: Admitting: General Practice

## 2024-02-27 ENCOUNTER — Encounter
Admission: EM | Disposition: A | Discharge status: Skilled Nursing Facility | Payer: Self-pay | Source: Skilled Nursing Facility | Attending: Student

## 2024-02-27 ENCOUNTER — Ambulatory Visit: Admit: 2024-02-27 | Admitting: Orthopedic Surgery

## 2024-02-27 ENCOUNTER — Inpatient Hospital Stay

## 2024-02-27 DIAGNOSIS — S72001A Fracture of unspecified part of neck of right femur, initial encounter for closed fracture: Secondary | ICD-10-CM | POA: Diagnosis not present

## 2024-02-27 HISTORY — PX: HIP PINNING,CANNULATED: SHX1758

## 2024-02-27 LAB — CBC
HCT: 28.9 % — ABNORMAL LOW (ref 36.0–46.0)
Hemoglobin: 9.2 g/dL — ABNORMAL LOW (ref 12.0–15.0)
MCH: 29.9 pg (ref 26.0–34.0)
MCHC: 31.8 g/dL (ref 30.0–36.0)
MCV: 93.8 fL (ref 80.0–100.0)
Platelets: 194 K/uL (ref 150–400)
RBC: 3.08 MIL/uL — ABNORMAL LOW (ref 3.87–5.11)
RDW: 17.6 % — ABNORMAL HIGH (ref 11.5–15.5)
WBC: 8.9 K/uL (ref 4.0–10.5)
nRBC: 0 % (ref 0.0–0.2)

## 2024-02-27 LAB — COMPREHENSIVE METABOLIC PANEL WITH GFR
ALT: 12 U/L (ref 0–44)
AST: 23 U/L (ref 15–41)
Albumin: 3.6 g/dL (ref 3.5–5.0)
Alkaline Phosphatase: 51 U/L (ref 38–126)
Anion gap: 11 (ref 5–15)
BUN: 20 mg/dL (ref 8–23)
CO2: 26 mmol/L (ref 22–32)
Calcium: 9 mg/dL (ref 8.9–10.3)
Chloride: 102 mmol/L (ref 98–111)
Creatinine, Ser: 1 mg/dL (ref 0.44–1.00)
GFR, Estimated: 56 mL/min — ABNORMAL LOW (ref >60)
Glucose, Bld: 129 mg/dL — ABNORMAL HIGH (ref 70–99)
Potassium: 4 mmol/L (ref 3.5–5.1)
Sodium: 139 mmol/L (ref 135–145)
Total Bilirubin: 0.8 mg/dL (ref 0.0–1.2)
Total Protein: 6 g/dL — ABNORMAL LOW (ref 6.5–8.1)

## 2024-02-27 LAB — MRSA NEXT GEN BY PCR, NASAL: MRSA by PCR Next Gen: NOT DETECTED

## 2024-02-27 SURGERY — FIXATION, FEMUR, NECK, PERCUTANEOUS, USING SCREW
Anesthesia: General | Site: Hip | Laterality: Right

## 2024-02-27 MED ORDER — BISACODYL 10 MG RE SUPP: 10.0000 mg | Freq: Every day | RECTAL | Status: DC | PRN

## 2024-02-27 MED ORDER — PROPOFOL 10 MG/ML IV BOLUS
INTRAVENOUS | Status: DC | PRN
  Administered 2024-02-27: 50 mg via INTRAVENOUS
  Administered 2024-02-27: 100 mg via INTRAVENOUS

## 2024-02-27 MED ORDER — CEFAZOLIN SODIUM-DEXTROSE 2-4 GM/100ML-% IV SOLN
2.0000 g | INTRAVENOUS | Status: AC
  Administered 2024-02-27: 2 g via INTRAVENOUS

## 2024-02-27 MED ORDER — METHOCARBAMOL 1000 MG/10ML IJ SOLN: 500.0000 mg | Freq: Four times a day (QID) | INTRAMUSCULAR | Status: DC | PRN

## 2024-02-27 MED ORDER — METOPROLOL TARTRATE 5 MG/5ML IV SOLN
5.0000 mg | Freq: Once | INTRAVENOUS | Status: AC
  Administered 2024-02-27: 5 mg via INTRAVENOUS
  Filled 2024-02-27: qty 5

## 2024-02-27 MED ORDER — ALLOPURINOL 100 MG PO TABS
100.0000 mg | ORAL_TABLET | Freq: Every day | ORAL | Status: DC
  Administered 2024-02-28 – 2024-03-03 (×5): 100 mg via ORAL
  Filled 2024-02-27 (×6): qty 1

## 2024-02-27 MED ORDER — FENTANYL CITRATE (PF) 100 MCG/2ML IJ SOLN
INTRAMUSCULAR | Status: DC | PRN
  Administered 2024-02-27 (×2): 50 ug via INTRAVENOUS

## 2024-02-27 MED ORDER — METHOCARBAMOL 500 MG PO TABS
500.0000 mg | ORAL_TABLET | Freq: Four times a day (QID) | ORAL | Status: DC | PRN
Start: 2024-02-27 — End: 2024-03-04
  Administered 2024-02-29 – 2024-03-03 (×3): 500 mg via ORAL
  Filled 2024-02-27 (×3): qty 1

## 2024-02-27 MED ORDER — HYDRALAZINE HCL 25 MG PO TABS
25.0000 mg | ORAL_TABLET | Freq: Two times a day (BID) | ORAL | Status: DC
  Administered 2024-02-28 – 2024-03-02 (×7): 25 mg via ORAL
  Filled 2024-02-27 (×8): qty 1

## 2024-02-27 MED ORDER — ONDANSETRON HCL 4 MG PO TABS: 4.0000 mg | ORAL_TABLET | Freq: Four times a day (QID) | ORAL | Status: DC | PRN

## 2024-02-27 MED ORDER — FLEET ENEMA RE ENEM
1.0000 | ENEMA | Freq: Once | RECTAL | Status: DC | PRN
  Filled 2024-02-27: qty 1

## 2024-02-27 MED ORDER — PHENYLEPHRINE HCL-NACL 20-0.9 MG/250ML-% IV SOLN
INTRAVENOUS | Status: AC
  Filled 2024-02-27: qty 250

## 2024-02-27 MED ORDER — BUSPIRONE HCL 10 MG PO TABS
10.0000 mg | ORAL_TABLET | Freq: Two times a day (BID) | ORAL | Status: DC
Start: 2024-02-27 — End: 2024-03-04
  Administered 2024-02-28 – 2024-03-03 (×9): 10 mg via ORAL
  Filled 2024-02-27 (×10): qty 1

## 2024-02-27 MED ORDER — METOCLOPRAMIDE HCL 5 MG/ML IJ SOLN: 5.0000 mg | Freq: Three times a day (TID) | INTRAMUSCULAR | Status: DC | PRN

## 2024-02-27 MED ORDER — DONEPEZIL HCL 5 MG PO TABS
5.0000 mg | ORAL_TABLET | Freq: Every day | ORAL | Status: DC
  Administered 2024-02-28 – 2024-03-02 (×4): 5 mg via ORAL
  Filled 2024-02-27 (×4): qty 1

## 2024-02-27 MED ORDER — FENTANYL CITRATE (PF) 100 MCG/2ML IJ SOLN
INTRAMUSCULAR | Status: AC
  Filled 2024-02-27: qty 2

## 2024-02-27 MED ORDER — METOPROLOL SUCCINATE ER 25 MG PO TB24
12.5000 mg | ORAL_TABLET | Freq: Every day | ORAL | Status: DC
  Administered 2024-02-28 – 2024-03-03 (×5): 12.5 mg via ORAL
  Filled 2024-02-27 (×6): qty 1

## 2024-02-27 MED ORDER — BUPIVACAINE LIPOSOME 1.3 % IJ SUSP
INTRAMUSCULAR | Status: DC | PRN
  Administered 2024-02-27: 20 mL via SURGICAL_CAVITY

## 2024-02-27 MED ORDER — CEFAZOLIN SODIUM-DEXTROSE 2-4 GM/100ML-% IV SOLN
2.0000 g | Freq: Four times a day (QID) | INTRAVENOUS | Status: AC
  Administered 2024-02-27 – 2024-02-28 (×3): 2 g via INTRAVENOUS
  Filled 2024-02-27 (×3): qty 100

## 2024-02-27 MED ORDER — CEFAZOLIN SODIUM-DEXTROSE 2-4 GM/100ML-% IV SOLN
INTRAVENOUS | Status: AC
  Filled 2024-02-27: qty 100

## 2024-02-27 MED ORDER — DEXAMETHASONE SODIUM PHOSPHATE 10 MG/ML IJ SOLN
INTRAMUSCULAR | Status: AC
  Filled 2024-02-27: qty 1

## 2024-02-27 MED ORDER — VITAMIN D 25 MCG (1000 UNIT) PO TABS
1000.0000 [IU] | ORAL_TABLET | Freq: Every day | ORAL | Status: DC
  Administered 2024-02-28 – 2024-03-03 (×5): 1000 [IU] via ORAL
  Filled 2024-02-27 (×5): qty 1

## 2024-02-27 MED ORDER — LIDOCAINE HCL (CARDIAC) PF 100 MG/5ML IV SOSY
PREFILLED_SYRINGE | INTRAVENOUS | Status: DC | PRN
  Administered 2024-02-27: 40 mg via INTRAVENOUS

## 2024-02-27 MED ORDER — LACTATED RINGERS IV SOLN: INTRAVENOUS | Status: DC | PRN

## 2024-02-27 MED ORDER — ROCURONIUM BROMIDE 100 MG/10ML IV SOLN
INTRAVENOUS | Status: DC | PRN
  Administered 2024-02-27: 50 mg via INTRAVENOUS

## 2024-02-27 MED ORDER — HYDROMORPHONE HCL 1 MG/ML IJ SOLN
0.2000 mg | INTRAMUSCULAR | Status: DC | PRN
  Administered 2024-02-28 – 2024-03-02 (×3): 0.4 mg via INTRAVENOUS
  Filled 2024-02-27 (×3): qty 0.5

## 2024-02-27 MED ORDER — BUPIVACAINE HCL (PF) 0.5 % IJ SOLN
INTRAMUSCULAR | Status: AC
  Filled 2024-02-27: qty 30

## 2024-02-27 MED ORDER — PHENYLEPHRINE 80 MCG/ML (10ML) SYRINGE FOR IV PUSH (FOR BLOOD PRESSURE SUPPORT)
PREFILLED_SYRINGE | INTRAVENOUS | Status: DC | PRN
  Administered 2024-02-27 (×2): 80 ug via INTRAVENOUS

## 2024-02-27 MED ORDER — ATORVASTATIN CALCIUM 20 MG PO TABS
40.0000 mg | ORAL_TABLET | Freq: Every day | ORAL | Status: DC
  Administered 2024-02-28 – 2024-03-02 (×4): 40 mg via ORAL
  Filled 2024-02-27 (×5): qty 2

## 2024-02-27 MED ORDER — OXYCODONE HCL 5 MG PO TABS
2.5000 mg | ORAL_TABLET | ORAL | Status: DC | PRN
  Administered 2024-02-28 – 2024-03-02 (×5): 5 mg via ORAL
  Filled 2024-02-27 (×4): qty 1

## 2024-02-27 MED ORDER — SENNOSIDES-DOCUSATE SODIUM 8.6-50 MG PO TABS: 1.0000 | ORAL_TABLET | Freq: Every evening | ORAL | Status: DC | PRN

## 2024-02-27 MED ORDER — PHENYLEPHRINE HCL-NACL 20-0.9 MG/250ML-% IV SOLN
INTRAVENOUS | Status: DC | PRN
  Administered 2024-02-27: 20 ug/min via INTRAVENOUS

## 2024-02-27 MED ORDER — ENOXAPARIN SODIUM 40 MG/0.4ML IJ SOSY
40.0000 mg | PREFILLED_SYRINGE | INTRAMUSCULAR | Status: DC
  Administered 2024-02-28 – 2024-03-03 (×5): 40 mg via SUBCUTANEOUS
  Filled 2024-02-27 (×5): qty 0.4

## 2024-02-27 MED ORDER — ACETAMINOPHEN 500 MG PO TABS
1000.0000 mg | ORAL_TABLET | Freq: Three times a day (TID) | ORAL | Status: DC
  Administered 2024-02-28 – 2024-03-02 (×9): 1000 mg via ORAL
  Filled 2024-02-27 (×12): qty 2

## 2024-02-27 MED ORDER — ONDANSETRON HCL 4 MG/2ML IJ SOLN
INTRAMUSCULAR | Status: DC | PRN
  Administered 2024-02-27: 4 mg via INTRAVENOUS

## 2024-02-27 MED ORDER — DEXAMETHASONE SODIUM PHOSPHATE 10 MG/ML IJ SOLN
INTRAMUSCULAR | Status: DC | PRN
  Administered 2024-02-27: 5 mg via INTRAVENOUS

## 2024-02-27 MED ORDER — ACETAMINOPHEN 10 MG/ML IV SOLN
INTRAVENOUS | Status: AC
  Filled 2024-02-27: qty 100

## 2024-02-27 MED ORDER — TRAMADOL HCL 50 MG PO TABS: 50.0000 mg | ORAL_TABLET | Freq: Four times a day (QID) | ORAL | Status: DC | PRN

## 2024-02-27 MED ORDER — ACETAMINOPHEN 325 MG PO TABS: 650.0000 mg | ORAL_TABLET | Freq: Four times a day (QID) | ORAL | Status: DC

## 2024-02-27 MED ORDER — 0.9 % SODIUM CHLORIDE (POUR BTL) OPTIME
TOPICAL | Status: DC | PRN
  Administered 2024-02-27: 500 mL

## 2024-02-27 MED ORDER — SEVOFLURANE IN SOLN
RESPIRATORY_TRACT | Status: AC
  Filled 2024-02-27: qty 250

## 2024-02-27 MED ORDER — DOCUSATE SODIUM 100 MG PO CAPS
100.0000 mg | ORAL_CAPSULE | Freq: Two times a day (BID) | ORAL | Status: DC
  Administered 2024-02-28 – 2024-03-02 (×8): 100 mg via ORAL
  Filled 2024-02-27 (×10): qty 1

## 2024-02-27 MED ORDER — ACETAMINOPHEN 10 MG/ML IV SOLN
INTRAVENOUS | Status: DC | PRN
  Administered 2024-02-27: 1000 mg via INTRAVENOUS

## 2024-02-27 MED ORDER — SUGAMMADEX SODIUM 200 MG/2ML IV SOLN
INTRAVENOUS | Status: DC | PRN
  Administered 2024-02-27: 200 mg via INTRAVENOUS

## 2024-02-27 MED ORDER — OXYCODONE HCL 5 MG PO TABS
5.0000 mg | ORAL_TABLET | ORAL | Status: DC | PRN
  Administered 2024-02-29 – 2024-03-03 (×2): 10 mg via ORAL
  Filled 2024-02-27 (×2): qty 2
  Filled 2024-02-27: qty 1

## 2024-02-27 MED ORDER — SODIUM CHLORIDE 0.9 % IV SOLN
INTRAVENOUS | Status: DC
  Administered 2024-03-01: 1000 mL via INTRAVENOUS

## 2024-02-27 MED ORDER — DROPERIDOL 2.5 MG/ML IJ SOLN: 0.6250 mg | Freq: Once | INTRAMUSCULAR | Status: DC | PRN

## 2024-02-27 MED ORDER — SENNA 8.6 MG PO TABS
2.0000 | ORAL_TABLET | Freq: Every day | ORAL | Status: DC
  Administered 2024-02-28 – 2024-03-03 (×5): 17.2 mg via ORAL
  Filled 2024-02-27 (×5): qty 2

## 2024-02-27 MED ORDER — ONDANSETRON HCL 4 MG/2ML IJ SOLN: 4.0000 mg | Freq: Four times a day (QID) | INTRAMUSCULAR | Status: DC | PRN

## 2024-02-27 MED ORDER — BUPIVACAINE LIPOSOME 1.3 % IJ SUSP
INTRAMUSCULAR | Status: AC
  Filled 2024-02-27: qty 10

## 2024-02-27 MED ORDER — ONDANSETRON HCL 4 MG/2ML IJ SOLN
INTRAMUSCULAR | Status: AC
  Filled 2024-02-27: qty 2

## 2024-02-27 MED ORDER — FENTANYL CITRATE (PF) 100 MCG/2ML IJ SOLN: 25.0000 ug | INTRAMUSCULAR | Status: DC | PRN

## 2024-02-27 MED ORDER — SERTRALINE HCL 50 MG PO TABS
100.0000 mg | ORAL_TABLET | Freq: Every day | ORAL | Status: DC
  Administered 2024-02-28 – 2024-03-02 (×4): 100 mg via ORAL
  Filled 2024-02-27 (×4): qty 2

## 2024-02-27 MED ORDER — METOCLOPRAMIDE HCL 5 MG PO TABS: 5.0000 mg | ORAL_TABLET | Freq: Three times a day (TID) | ORAL | Status: DC | PRN

## 2024-02-27 SURGICAL SUPPLY — 38 items
BIT DRILL CANN QC 4.9 LRG (BIT) IMPLANT
BLADE SURG 15 STRL LF DISP TIS (BLADE) ×1 IMPLANT
CHLORAPREP W/TINT 26 (MISCELLANEOUS) ×1 IMPLANT
COVER LIGHT HANDLE STERIS (MISCELLANEOUS) IMPLANT
DRAPE SHEET LG 3/4 BI-LAMINATE (DRAPES) ×1 IMPLANT
DRAPE SURG 17X11 SM STRL (DRAPES) ×2 IMPLANT
DRAPE U-SHAPE 47X51 STRL (DRAPES) ×2 IMPLANT
DRSG OPSITE POSTOP 3X4 (GAUZE/BANDAGES/DRESSINGS) ×1 IMPLANT
DRSG OPSITE POSTOP 4X6 (GAUZE/BANDAGES/DRESSINGS) IMPLANT
ELECTRODE REM PT RTRN 9FT ADLT (ELECTROSURGICAL) ×1 IMPLANT
GAUZE XEROFORM 1X8 LF (GAUZE/BANDAGES/DRESSINGS) ×1 IMPLANT
GLOVE BIOGEL PI IND STRL 8 (GLOVE) ×1 IMPLANT
GLOVE SURG SYN 7.5 PF PI (GLOVE) ×2 IMPLANT
GOWN SRG LRG LVL 4 IMPRV REINF (GOWNS) ×1 IMPLANT
GOWN SRG XL LONG LVL 3 NONREIN (GOWNS) ×1 IMPLANT
GUIDEWIRE THRD ASNIS 3.2X300 (WIRE) IMPLANT
KIT TURNOVER CYSTO (KITS) ×1 IMPLANT
MANIFOLD NEPTUNE II (INSTRUMENTS) ×1 IMPLANT
MAT ABSORB FLUID 56X50 GRAY (MISCELLANEOUS) ×2 IMPLANT
NDL FILTER BLUNT 18X1 1/2 (NEEDLE) ×1 IMPLANT
NDL HYPO 22X1.5 SAFETY MO (MISCELLANEOUS) ×1 IMPLANT
NEEDLE FILTER BLUNT 18X1 1/2 (NEEDLE) ×1 IMPLANT
NEEDLE HYPO 22X1.5 SAFETY MO (MISCELLANEOUS) ×1 IMPLANT
NS IRRIG 500ML POUR BTL (IV SOLUTION) ×1 IMPLANT
PACK HIP COMPR (MISCELLANEOUS) ×1 IMPLANT
PENCIL SMOKE EVACUATOR (MISCELLANEOUS) IMPLANT
SCREW ASNIS 85MM (Screw) IMPLANT
SCREW ASNIS 90MM (Screw) IMPLANT
SCREW CANN 6.5X80 STRL (Screw) IMPLANT
STAPLER SKIN PROX 35W (STAPLE) ×1 IMPLANT
SUT VIC AB 0 CT1 36 (SUTURE) ×1 IMPLANT
SUT VIC AB 2-0 CT1 TAPERPNT 27 (SUTURE) ×1 IMPLANT
SYR 30ML LL (SYRINGE) ×1 IMPLANT
SYR 5ML LL (SYRINGE) ×1 IMPLANT
TAPE CLOTH 3X10 WHT NS LF (GAUZE/BANDAGES/DRESSINGS) ×1 IMPLANT
TRAP FLUID SMOKE EVACUATOR (MISCELLANEOUS) ×1 IMPLANT
WASHER ORTH CANN SS SCR (Washer) IMPLANT
WATER STERILE IRR 500ML POUR (IV SOLUTION) ×1 IMPLANT

## 2024-02-27 NOTE — H&P (Signed)
 H&P reviewed. No significant changes noted.

## 2024-02-27 NOTE — Progress Notes (Signed)
 Admission completed with assistance of pt's daughters at bedside.

## 2024-02-27 NOTE — Transfer of Care (Signed)
 Immediate Anesthesia Transfer of Care Note  Patient: Pam Keller  Procedure(s) Performed: FIXATION, FEMUR, NECK, PERCUTANEOUS, USING SCREW (Right: Hip)  Patient Location: PACU  Anesthesia Type:General  Level of Consciousness: drowsy  Airway & Oxygen Therapy: Patient Spontanous Breathing and Patient connected to face mask oxygen  Post-op Assessment: Report given to RN and Post -op Vital signs reviewed and stable  Post vital signs: Reviewed  Last Vitals:  Vitals Value Taken Time  BP 188/69   Temp    Pulse 79 02/27/24 16:52  Resp 20 02/27/24 16:52  SpO2 95 % 02/27/24 16:52  Vitals shown include unfiled device data.  Last Pain:  Vitals:   02/27/24 1419  TempSrc: Temporal  PainSc:          Complications: No notable events documented.

## 2024-02-27 NOTE — Anesthesia Preprocedure Evaluation (Signed)
 Anesthesia Evaluation  Patient identified by MRN, date of birth, ID band Patient awake    Reviewed: Allergy & Precautions, H&P , NPO status , Patient's Chart, lab work & pertinent test results, reviewed documented beta blocker date and time   History of Anesthesia Complications Negative for: history of anesthetic complications  Airway Mallampati: III  TM Distance: >3 FB Neck ROM: full    Dental  (+) Dental Advidsory Given, Caps, Missing, Chipped, Poor Dentition   Pulmonary neg pulmonary ROS, former smoker   Pulmonary exam normal breath sounds clear to auscultation       Cardiovascular Exercise Tolerance: Good hypertension, (-) angina (-) Past MI and (-) Cardiac Stents Normal cardiovascular exam(-) dysrhythmias (-) Valvular Problems/Murmurs Rhythm:regular Rate:Normal     Neuro/Psych  PSYCHIATRIC DISORDERS Anxiety Depression   Dementia negative neurological ROS     GI/Hepatic negative GI ROS, Neg liver ROS,,,  Endo/Other  negative endocrine ROS    Renal/GU negative Renal ROS  negative genitourinary   Musculoskeletal   Abdominal   Peds  Hematology negative hematology ROS (+)   Anesthesia Other Findings Past Medical History: No date: Basal cell carcinoma No date: Dementia (HCC) No date: Hyperlipidemia No date: Hypertension 06/24/2008: Microscopic hematuria     Comment:  negative workuo No date: Pulmonary nodule   Reproductive/Obstetrics negative OB ROS                              Anesthesia Physical Anesthesia Plan  ASA: 3  Anesthesia Plan: General   Post-op Pain Management:    Induction: Intravenous  PONV Risk Score and Plan: 3 and Ondansetron , Dexamethasone  and Treatment may vary due to age or medical condition  Airway Management Planned: Oral ETT  Additional Equipment:   Intra-op Plan:   Post-operative Plan: Extubation in OR  Informed Consent: I have reviewed the  patients History and Physical, chart, labs and discussed the procedure including the risks, benefits and alternatives for the proposed anesthesia with the patient or authorized representative who has indicated his/her understanding and acceptance.     Dental Advisory Given  Plan Discussed with: Anesthesiologist, CRNA and Surgeon  Anesthesia Plan Comments:         Anesthesia Quick Evaluation

## 2024-02-27 NOTE — Consult Note (Signed)
 ORTHOPAEDIC CONSULTATION  REQUESTING PHYSICIAN: Franchot Novel, MD  Chief Complaint:   R hip pain  History of Present Illness: History obtained from patient's family (2 daughters) at the bedside, review of medical records, discussion with medical providers.  Patient is DNR/DNI.  Family states that the patient cannot remember from one day to the next and will sometimes forget who they are.  Pam Keller is a 83 y.o. female with a past medical history of severe dementia, hyperlipidemia, and hypertension who presented to the emergency department after an unwitnessed fall at her memory care unit.  Patient normally ambulates unassisted, but has not been able to bear weight.  Pain is worse with any sort of movement.  Imaging in the emergency department shows a valgus impacted right femoral neck fracture.  Past Medical History:  Diagnosis Date   Basal cell carcinoma    Dementia (HCC)    Hyperlipidemia    Hypertension    Microscopic hematuria 06/24/2008   negative workuo   Pulmonary nodule    Past Surgical History:  Procedure Laterality Date   ABDOMINAL HYSTERECTOMY  1984   menorrhagia   APPENDECTOMY  1955   for rupture appx   BREAST BIOPSY     benign   CARPAL TUNNEL RELEASE     FOOT SURGERY     macular surgery     [facility], repaired   MOHS SURGERY  2012   for basal cell, forehead   OOPHORECTOMY  1960   TRIGGER FINGER RELEASE  2004   [facility]   Social History   Socioeconomic History   Marital status: Widowed    Spouse name: Todd   Number of children: Not on file   Years of education: Not on file   Highest education level: Not on file  Occupational History   Not on file  Tobacco Use   Smoking status: Former    Current packs/day: 0.00    Average packs/day: 0.5 packs/day for 48.0 years (24.0 ttl pk-yrs)    Types: Cigarettes    Start date: 04/29/1959    Quit date: 04/29/2007    Years since quitting: 16.8    Smokeless tobacco: Never  Substance and Sexual Activity   Alcohol use: Yes    Alcohol/week: 0.0 standard drinks of alcohol    Comment: once or a twice a year.   Drug use: No   Sexual activity: Not Currently  Other Topics Concern   Not on file  Social History Narrative   Retired Paramedic         Social Drivers of Health   Financial Resource Strain: Low Risk  (01/09/2024)   Received from [facility]   Overall Financial Resource Strain (CARDIA)    Difficulty of Paying Living Expenses: Not hard at all  Food Insecurity: No Food Insecurity (02/27/2024)   Hunger Vital Sign    Worried About Running Out of Food in the Last Year: Never true    Ran Out of Food in the Last Year: Never true  Transportation Needs: No Transportation Needs (02/27/2024)   PRAPARE - Administrator, Civil Service (Medical): No    Lack of Transportation (Non-Medical): No  Physical Activity: Not on file  Stress: Not on file  Social Connections: Socially Isolated (02/27/2024)   Social Connection and Isolation Panel    Frequency of Communication with Friends and Family: Three times a week    Frequency of Social Gatherings with Friends and Family: Three times a week  Attends Religious Services: Never    Active Member of Clubs or Organizations: No    Attends Banker Meetings: Never    Marital Status: Widowed   Family History  Problem Relation Age of Onset   Hypertension Father    Cancer Neg Hx    Allergies  Allergen Reactions   Meperidine Other (See Comments)    Other Reaction: GI Upset Other reaction(s): Other (See Comments) Other reaction(s): Other (See Comments) Other Reaction: GI Upset Other Reaction: GI Upset   Clindamycin    Clindamycin/Lincomycin    Demerol    Hydrocodone    Sulfa Antibiotics    Codeine     Other reaction(s): Other (See Comments) Other Reaction: GI Upset   Penicillins     Other reaction(s): Other (See Comments) Other Reaction: GI  Upset   Prior to Admission medications   Medication Sig Start Date End Date Taking? Authorizing Provider  Acetaminophen  Extra Strength 500 MG TABS Take 2 tablets by mouth 3 (three) times daily. 02/11/24  Yes [provider]  allopurinol  (ZYLOPRIM ) 100 MG tablet Take 100 mg by mouth daily. 12/15/23  Yes [provider]  aspirin  81 MG chewable tablet Chew 81 mg by mouth daily.   Yes [provider]  atorvastatin  (LIPITOR) 40 MG tablet Take 40 mg by mouth at bedtime. 12/15/23  Yes [provider]  busPIRone  (BUSPAR ) 10 MG tablet Take 10 mg by mouth 2 (two) times daily. 02/04/20  Yes [provider]  CALCIUM  ANTACID EXTRA STRENGTH 750 MG chewable tablet Chew 1 tablet by mouth daily. 01/12/24  Yes [provider]  Cholecalciferol (VITAMIN D3) 50 MCG (2000 UT) TABS Take 1 tablet by mouth daily. 12/15/19  Yes [provider]  donepezil  (ARICEPT ) 5 MG tablet TAKE 1 TABLET (5 MG TOTAL) BY MOUTH AT BEDTIME. 05/27/16  Yes Bertrum Charlie CROME, MD  Ferrous Sulfate (IRON) 325 (65 Fe) MG TABS Take 1 tablet by mouth daily. 12/15/19  Yes [provider]  hydrALAZINE  (APRESOLINE ) 25 MG tablet Take 25 mg by mouth 2 (two) times daily. 12/15/23  Yes [provider]  ibuprofen (ADVIL) 200 MG tablet Take 2 tablets by mouth every 12 (twelve) hours as needed for mild pain (pain score 1-3). 12/25/23  Yes [provider]  LORazepam  (ATIVAN ) 0.5 MG tablet Take 0.25 mg by mouth 2 (two) times daily. 02/04/20 03/10/24 Yes [provider]  LORazepam  (ATIVAN ) 0.5 MG tablet Take 0.5 mg by mouth daily as needed for anxiety (OR AGITATION). (MAX 1 DOSE/24 HRS) 12/15/23  Yes [provider]  losartan  (COZAAR ) 100 MG tablet Take 100 mg by mouth daily. CHECK BP AND PULSE PRIOR TO ADMINISTRATION. HOLD MEDICATION IF SYSTOLIC BP LESS THAN 110. 02/04/20  Yes [provider]  metoprolol  succinate (TOPROL -XL) 25 MG 24 hr tablet Take 12.5 mg by  mouth daily. HOLD IF SBP<105, PULSE<60. (DO NOT CRUSH.) 02/04/20  Yes [provider]  senna (SENOKOT) 8.6 MG TABS tablet Take 2 tablets by mouth daily.   Yes [provider]  sertraline  (ZOLOFT ) 100 MG tablet Take 100 mg by mouth at bedtime.   Yes [provider]  Skin Protectants, Misc. (MINERIN CREME) CREA Apply 1 Application topically every morning. APPLY TOPICALLY TO ARM AND LEGS EVERY MORNING 12/15/19  Yes [provider]  SODIUM FLUORIDE 5000 PLUS 1.1 % CREA dental cream Place 1 Application onto teeth at bedtime. BRUSH WITH PEA SIZE AMOUNT AT BEDTIME AND DO NOT RINSE. 07/23/23  Yes [provider]  traZODone  (DESYREL ) 50 MG tablet Take 25 mg by mouth at bedtime. 12/15/23  Yes [provider]   Recent Labs    02/26/24 2026 02/26/24 2233 02/27/24 0431  WBC 11.0*  --  8.9  HGB 10.5*  --  9.2*  HCT 32.5*  --  28.9*  PLT 204  --  194  K 4.0  --  4.0  CL 100  --  102  CO2 25  --  26  BUN 23  --  20  CREATININE 1.07*  --  1.00  GLUCOSE 130*  --  129*  CALCIUM  9.6  --  9.0  INR  --  1.1  --    CT Hip Right Wo Contrast Result Date: 02/26/2024 CLINICAL DATA:  Evaluate for worsening fracture EXAM: CT OF THE RIGHT HIP WITHOUT CONTRAST TECHNIQUE: Multidetector CT imaging of the right hip was performed according to the standard protocol. Multiplanar CT image reconstructions were also generated. RADIATION DOSE REDUCTION: This exam was performed according to the departmental dose-optimization program which includes automated exposure control, adjustment of the mA and/or kV according to patient size and/or use of iterative reconstruction technique. COMPARISON:  X-ray pelvis 12/18/2023, x-ray right hip 02/26/2024 FINDINGS: Bones/Joint/Cartilage Acute impacted right subcapital femoral neck fracture. No right hip dislocation. No acute displaced fracture or diastasis of the visualized bones of the right pelvis. Moderate degenerative changes of the right  hip. No aggressive appearing focal bone abnormality. Ligaments Suboptimally assessed by CT. Muscles and Tendons Grossly remarkable. Soft tissues Right hip subcutaneus soft tissue hematoma. Other: Atherosclerotic plaque.  Colonic diverticulosis. IMPRESSION: 1. Acute impacted right subcapital femoral neck fracture. 2. Right hip subcutaneus soft tissue hematoma. Electronically Signed   By: Morgane  Naveau M.D.   On: 02/26/2024 22:13   DG Shoulder Right Result Date: 02/26/2024 CLINICAL DATA:  concern for fracture EXAM: RIGHT SHOULDER - 2+ VIEW COMPARISON:  None Available. FINDINGS: Age-indeterminate nondisplaced fracture of the humeral head. No dislocation of the right shoulder. Degenerative changes of the right shoulder. No aggressive appearing focal bone abnormality. Soft tissues are unremarkable. IMPRESSION: Age-indeterminate nondisplaced fracture of the humeral head. Electronically Signed   By: Morgane  Naveau M.D.   On: 02/26/2024 21:34   DG Chest 1 View Result Date: 02/26/2024 CLINICAL DATA:  [id number] Fall [id number] EXAM: CHEST  1 VIEW COMPARISON:  None Available. FINDINGS: The heart and mediastinal contours are within normal limits. Atherosclerotic plaque. No focal consolidation. No pulmonary edema. No pleural effusion. No pneumothorax. No acute osseous abnormality. Degenerative changes of the right shoulder. IMPRESSION: 1. No active disease. 2.  Aortic Atherosclerosis (ICD10-I70.0). Electronically Signed   By: Morgane  Naveau M.D.   On: 02/26/2024 21:33   DG Hip Unilat W or Wo Pelvis 2-3 Views Right Result Date: 02/26/2024 CLINICAL DATA:  concern for fracture EXAM: DG HIP (WITH OR WITHOUT PELVIS) 2-3V RIGHT COMPARISON:  None Available. FINDINGS: Limited evaluation due to overlapping osseous structures and overlying soft tissues. Question acute nondisplaced fracture of the right femoral head/neck. Moderate degenerative changes of the right hip. Right hip subcutaneus tissue edema. Vascular calcification.  IMPRESSION: Question acute nondisplaced fracture of the right femoral head/neck. Limited evaluation due to overlapping osseous structures and overlying soft tissues. Electronically Signed   By: Morgane  Naveau M.D.   On: 02/26/2024 21:30   CT Head Wo Contrast Result Date: 02/26/2024 CLINICAL DATA:  Unwitnessed fall; pain after fall EXAM: CT HEAD WITHOUT CONTRAST CT CERVICAL SPINE WITHOUT CONTRAST TECHNIQUE: Multidetector CT imaging of the  head and cervical spine was performed following the standard protocol without intravenous contrast. Multiplanar CT image reconstructions of the cervical spine were also generated. RADIATION DOSE REDUCTION: This exam was performed according to the departmental dose-optimization program which includes automated exposure control, adjustment of the mA and/or kV according to patient size and/or use of iterative reconstruction technique. COMPARISON:  CT max face 12/18/2023 FINDINGS: CT HEAD FINDINGS Brain: Cerebral ventricle sizes are concordant with the degree of cerebral volume loss. Patchy and confluent areas of decreased attenuation are noted throughout the deep and periventricular white matter of the cerebral hemispheres bilaterally, compatible with chronic microvascular ischemic disease. No evidence of large-territorial acute infarction. No parenchymal hemorrhage. No mass lesion. No extra-axial collection. No mass effect or midline shift. No hydrocephalus. Basilar cisterns are patent. Vascular: No hyperdense vessel. Atherosclerotic calcifications are present within the cavernous internal carotid arteries. Skull: No acute fracture or focal lesion. Old depressed left orbital floor fracture. Sinuses/Orbits: Paranasal sinuses and mastoid air cells are clear. Bilateral lens replacement. Otherwise the orbits are unremarkable. Other: None. CT CERVICAL SPINE FINDINGS Alignment: Grade 1 anterolisthesis of C2 on C3, C3 on C4, C4 on C5. Skull base and vertebrae: Multilevel moderate severe  degenerative changes spine. No associated severe osseous neural foraminal or central canal stenosis. No acute fracture. No aggressive appearing focal osseous lesion or focal pathologic process. Soft tissues and spinal canal: No prevertebral fluid or swelling. No visible canal hematoma. Upper chest: Unremarkable. Other: None. IMPRESSION: 1. No acute intracranial abnormality. 2. No acute displaced fracture or traumatic listhesis of the cervical spine. 3. Old depressed left orbital floor fracture. Electronically Signed   By: Morgane  Naveau M.D.   On: 02/26/2024 20:27   CT Cervical Spine Wo Contrast Result Date: 02/26/2024 CLINICAL DATA:  Unwitnessed fall; pain after fall EXAM: CT HEAD WITHOUT CONTRAST CT CERVICAL SPINE WITHOUT CONTRAST TECHNIQUE: Multidetector CT imaging of the head and cervical spine was performed following the standard protocol without intravenous contrast. Multiplanar CT image reconstructions of the cervical spine were also generated. RADIATION DOSE REDUCTION: This exam was performed according to the departmental dose-optimization program which includes automated exposure control, adjustment of the mA and/or kV according to patient size and/or use of iterative reconstruction technique. COMPARISON:  CT max face 12/18/2023 FINDINGS: CT HEAD FINDINGS Brain: Cerebral ventricle sizes are concordant with the degree of cerebral volume loss. Patchy and confluent areas of decreased attenuation are noted throughout the deep and periventricular white matter of the cerebral hemispheres bilaterally, compatible with chronic microvascular ischemic disease. No evidence of large-territorial acute infarction. No parenchymal hemorrhage. No mass lesion. No extra-axial collection. No mass effect or midline shift. No hydrocephalus. Basilar cisterns are patent. Vascular: No hyperdense vessel. Atherosclerotic calcifications are present within the cavernous internal carotid arteries. Skull: No acute fracture or focal  lesion. Old depressed left orbital floor fracture. Sinuses/Orbits: Paranasal sinuses and mastoid air cells are clear. Bilateral lens replacement. Otherwise the orbits are unremarkable. Other: None. CT CERVICAL SPINE FINDINGS Alignment: Grade 1 anterolisthesis of C2 on C3, C3 on C4, C4 on C5. Skull base and vertebrae: Multilevel moderate severe degenerative changes spine. No associated severe osseous neural foraminal or central canal stenosis. No acute fracture. No aggressive appearing focal osseous lesion or focal pathologic process. Soft tissues and spinal canal: No prevertebral fluid or swelling. No visible canal hematoma. Upper chest: Unremarkable. Other: None. IMPRESSION: 1. No acute intracranial abnormality. 2. No acute displaced fracture or traumatic listhesis of the cervical spine. 3. Old depressed left orbital  floor fracture. Electronically Signed   By: Morgane  Naveau M.D.   On: 02/26/2024 20:27   DG Hip Unilat W or Wo Pelvis 2-3 Views Left Result Date: 02/26/2024 CLINICAL DATA:  Fall and left hip pain. EXAM: DG HIP (WITH OR WITHOUT PELVIS) 2-3V LEFT COMPARISON:  None Available. FINDINGS: No acute fracture or dislocation. The bones are osteopenic. Moderate bilateral hip arthritic changes. The soft tissues are unremarkable. Vascular calcifications noted. IMPRESSION: 1. No acute fracture or dislocation. 2. Moderate bilateral hip arthritic changes. Electronically Signed   By: Vanetta Chou M.D.   On: 02/26/2024 20:21     Positive ROS: All other systems have been reviewed and were otherwise negative with the exception of those mentioned in the HPI and as above.  Physical Exam: BP (!) 163/88   Pulse 93   Temp 97.7 F (36.5 C) (Temporal)   Resp 16   SpO2 93%  General: Drowsy, no acute distress Psychiatric: Not agitated   Orthopedic Exam:  RLE: + DF/PF/EHL SILT grossly over foot Foot wwp +Log roll/axial load    Imaging:  As above: Right valgus impacted femoral neck  fracture  Assessment/Plan: JERRIYAH LOUIS is a 83 y.o. female with a R valgus impacted femoral neck fracture   1. I discussed the various treatment options including both surgical and non-surgical management of the fracture with the patient and/or family (medical PoA). We discussed the high risk of perioperative complications due to patient's age, dementia, and other co-morbidities. After discussion of risks, benefits, and alternatives to surgery, the family and/or patient were in agreement to proceed with surgery. The goals of surgery would be to provide adequate pain relief and allow for mobilization. Plan for surgery is R hip percutaneous pinning today, 02/27/2024. 2. NPO until OR 3. Hold anticoagulation in advance of OR   Earnestine Blanch   02/27/2024 3:25 PM

## 2024-02-27 NOTE — Anesthesia Procedure Notes (Signed)
 Procedure Name: Intubation Date/Time: 02/27/2024 3:58 PM  Performed by: Leontine Katz, CRNAPre-anesthesia Checklist: Patient identified, Emergency Drugs available, Suction available and Patient being monitored Patient Re-evaluated:Patient Re-evaluated prior to induction Oxygen Delivery Method: Circle system utilized Preoxygenation: Pre-oxygenation with 100% oxygen Induction Type: IV induction Ventilation: Mask ventilation without difficulty Laryngoscope Size: Mac and 3 Grade View: Grade II Tube type: Oral Number of attempts: 1 Airway Equipment and Method: Stylet Placement Confirmation: ETT inserted through vocal cords under direct vision, positive ETCO2 and breath sounds checked- equal and bilateral Secured at: 21 cm Tube secured with: Tape Dental Injury: Teeth and Oropharynx as per pre-operative assessment  Comments: Atrauamtic intubation. Teeth as prior

## 2024-02-27 NOTE — Progress Notes (Addendum)
 Progress Note   Patient: Pam Keller FMW:[id number] DOB: 07/08/1940 DOA: 02/26/2024     1 DOS: the patient was seen and examined on 02/27/2024   Brief hospital course: Per H&P HPI  HPI: Pam Keller is a 83 y.o. female with medical history significant of dementia, hyperlipidemia, basal cell carcinoma, essential hypertension, history of pulmonary nodule, who was brought in from [facility] skilled facility after sustaining a mechanical fall.  Patient resides in the memory care unit over there.  She has right hip pain.  She normally ambulates but suspected to have the unwitnessed fall.  No one knows how long she was down.  Patient is accompanied by the daughter here.  She has had previous fall with right humeral fracture from June of this year which is healing.  She is apparently a DNI DNR at some point.  Not on any blood thinners.  Family want patient to proceed with surgery because she has been able to walk.  Dr. Tobie orthopedics consulted and will do surgery tomorrow.  We are admitting patient to the medical service.    Assessment and Plan: Right subcapsular nondisplaced femoral neck fracture: Patient will be admitted for surgical repair.  In the setting of mechanical fall.  Pain control.  No anticoagulation.  Supportive care.  Orthopedics to repair this PM.  -check vit D level   Advanced dementia  Drowsy. Continue her home regimen. Unclear if she is on ativan  for this.  Hold home trazodone  patient is drowsy  HTN Continue home regimen  HLD  On lipitor  Normocytic anemia  On iron.   Check iron panel.  CTM        Subjective: Drowsy not very itneractive. Daughters at bedside states that patient able to state whether in pain.  Usually eats by herself. Can ambulate.   Physical Exam: Vitals:   02/27/24 1700 02/27/24 1701 02/27/24 1717 02/27/24 1721  BP: (!) 204/89 (!) 181/84 (!) 194/84   Pulse: 80 90 82 77  Resp: 18 14 19  (!) 21  Temp:      TempSrc:      SpO2: 98%  96% 98% 97%   Physical Exam  Constitutional: In no distress. Drowsy.  Cardiovascular: Normal rate, regular rhythm. No lower extremity edema  Pulmonary: Non labored breathing on room air, no wheezing or rales.   Abdominal: Soft. Non distended  Neurological: Not interactive with exam.  Skin: Skin is warm and dry.   Data Reviewed:     Latest Ref Rng & Units 02/27/2024    4:31 AM 02/26/2024    8:26 PM 12/19/2023    7:48 AM  BMP  Glucose 70 - 99 mg/dL 870  869  97   BUN 8 - 23 mg/dL 20  23  27    Creatinine 0.44 - 1.00 mg/dL 8.99  8.92  8.93   Sodium 135 - 145 mmol/L 139  138  143   Potassium 3.5 - 5.1 mmol/L 4.0  4.0  3.7   Chloride 98 - 111 mmol/L 102  100  108   CO2 22 - 32 mmol/L 26  25  24    Calcium  8.9 - 10.3 mg/dL 9.0  9.6  9.4       Latest Ref Rng & Units 02/27/2024    4:31 AM 02/26/2024    8:26 PM 12/19/2023    7:48 AM  CBC  WBC 4.0 - 10.5 K/uL 8.9  11.0  6.9   Hemoglobin 12.0 - 15.0 g/dL 9.2  89.4  10.5   Hematocrit 36.0 - 46.0 % 28.9  32.5  33.0   Platelets 150 - 400 K/uL 194  204  200      Family Communication: Daughters at bedside.   Disposition: Status is: Inpatient Remains inpatient appropriate because: hip fx repair   Planned Discharge Destination: H/H at facility     Time spent: 35 minutes  Author: Alban Pepper, MD 02/27/2024 5:26 PM  For on call review [email or web address].

## 2024-02-27 NOTE — Op Note (Signed)
 DATE OF SURGERY: 02/27/2024  PREOPERATIVE DIAGNOSIS: Right valgus impacted femoral neck fracture  POSTOPERATIVE DIAGNOSIS: Right valgus impacted femoral neck fracture  PROCEDURE: Percutaneous pinning of Right femoral neck fracture  SURGEON: Earnestine HILARIO Blanch, MD  ASSISTANTS: none  EBL: 50 cc  COMPONENTS:  Stryker 6.58mm cannulated screws x 3 with washers (90mm, 85mm, 80mm)   INDICATIONS: Pam Keller is a 83 y.o. female who sustained a valgus impacted femoral neck fracture after a fall. Risks and benefits of percutaneous pinning were explained to the patient and/or family. Risks include but are not limited to bleeding, infection, injury to tissues, nerves, vessels, DVT/PE, malunion/nonunion, hardware failure, and risks of anesthesia. The patient and/or family understand these risks, have completed an informed consent, and wish to proceed.   PROCEDURE:  The patient was brought into the operating room. After administering spinal anesthesia, the patient was placed in the supine position on the Hana table. The uninjured leg was extended while the injured lower extremity was placed in a neutral position with care taken to not displace the fracture during positioning. The lateral aspects of the operative hip and thigh were prepped with ChloraPrep solution before being draped sterilely. IV antibiotics were administered. A timeout was performed to verify the appropriate surgical site, patient, and procedure.   The greater trochanter was identified and an approximately 5 cm incision was made over the lateral aspect of the proximal femur. The incision was carried down through the subcutaneous tissues to expose the IT band. This was split at the proximal portion of the incision and the vastus lateralis was split in line with its fibers. The lateral aspect of the femur was cleared of soft tissue. Under fluoroscopic guidance, a guidewire was placed along the inferior aspect of the femoral neck into the  head while ensuring the start point on the lateral cortex was not below the level of the lesser trochanter. A parallel guide was used to place two additional guidepins (superior posterior and superior anterior). Position of all pins was verified fluoroscopically in both the AP and lateral views. A measuring device was used the measure appropriate screw length. The guidepins were drilled with a 3.32mm drill. Appropriately sized screws were advanced starting with the inferior screw, then superior posterior, then superior anterior. Screws were sequentially tightened. Hardware position and bony alignment was confirmed fluoroscopically with AP and lateral views.   The wounds were irrigated thoroughly with sterile saline solution. The IT band was closed with 0-Vicryl. The subcutaneous tissues were closed using 2-0 Vicryl interrupted sutures. The skin was closed using staples. Sterile occlusive dressing was applied. The patient was then transferred to the recovery room in satisfactory condition after tolerating the procedure well.  POSTOPERATIVE PLAN: The patient will be WBAT on the operative extremity. Lovenox  40mg /day x 4 weeks to start on POD#1. Ancef  x 24 hours. PT/OT on POD#1.

## 2024-02-27 NOTE — Anesthesia Postprocedure Evaluation (Signed)
 Anesthesia Post Note  Patient: Abbie E Edgington  Procedure(s) Performed: FIXATION, FEMUR, NECK, PERCUTANEOUS, USING SCREW (Right: Hip)  Patient location during evaluation: PACU Anesthesia Type: General Level of consciousness: awake and alert Pain management: pain level controlled Vital Signs Assessment: post-procedure vital signs reviewed and stable Respiratory status: spontaneous breathing, nonlabored ventilation, respiratory function stable and patient connected to nasal cannula oxygen Cardiovascular status: blood pressure returned to baseline and stable Postop Assessment: no apparent nausea or vomiting Anesthetic complications: no   No notable events documented.   Last Vitals:  Vitals:   02/27/24 1745 02/27/24 1807  BP: (!) 190/90 (!) 185/98  Pulse: 76 80  Resp: 16 17  Temp:  36.8 C  SpO2: 98% 100%    Last Pain:  Vitals:   02/27/24 1807  TempSrc: Oral  PainSc:                  Debby Mines

## 2024-02-27 NOTE — Discharge Instructions (Addendum)
 Do you feel isolated?  [facility] offers a [employer] that anyone can call toll free at [phone]. The friendship line is available 24 hours a day  [facility] is a Program of All-inclusive Care for the Elderly (PACE). Their mission is to promote and sustain the independence of seniors wishing to remain in the community. They provide seniors with comprehensive long-term health, social, medical and dietary care. Their program is a safe alternative to nursing home care. [phone]  [facility] Physical Address [facility] [address] [place], [place] [address] Phone: [phone]. . Online zoom yoga class, connect with others without leaving your home [facility] offers [place] dance cardio sessions for individuals via Zoom. This program provides: - Dance fitness activities Please contact program for more information. Servinganyone in need adults 18+ hiv/aids individuals families Call [phone]  Email [email or web address] to get more info  [employer] offers an online [employer] to individuals where they can receive help to focus on their best health. Whether you're a [employer] member or not, the neighborhood center offers a... Main Serviceshealth education  exercise & fitness  community support services  recreation  virtual support Other Servicessupport groups Servinganyone in need adults young adults teens seniors individuals families [email or web address] to get more info  Schedule on their website  [facility] offers an array of activities for adults age 33 and over. This program provides:- Fitness and health programs- Tech classes- Activity books Main Serviceshealth education  community support services  exercise & fitness  recreation  more education Servingseniors  Call [phone]    For more resources go online to [email or web address] and type in you  zipcode  INSTRUCTIONS AFTER Surgery  Remove items at home which could result in a fall. This includes throw rugs or furniture in walking pathways ICE to the affected joint every three hours while awake for 30 minutes at a time, for at least the first 3-5 days, and then as needed for pain and swelling.  Continue to use ice for pain and swelling. You may notice swelling that will progress down to the foot and ankle.  This is normal after surgery.  Elevate your leg when you are not up walking on it.   Continue to use the breathing machine you got in the hospital (incentive spirometer) which will help keep your temperature down.  It is common for your temperature to cycle up and down following surgery, especially at night when you are not up moving around and exerting yourself.  The breathing machine keeps your lungs expanded and your temperature down.   DIET:  As you were doing prior to hospitalization, we recommend a well-balanced diet.  DRESSING / WOUND CARE / SHOWERING  Dressing change as needed.  No showering.  Follow-up at [facility] clinic orthopedics in 2 weeks for staple removal and x-rays of the right hip.  ACTIVITY  Increase activity slowly as tolerated, but follow the weight bearing instructions below.   No driving for 6 weeks or until further direction given by your physician.  You cannot drive while taking narcotics.  No lifting or carrying greater than 10 lbs. until further directed by your surgeon. Avoid periods of inactivity such as sitting longer than an hour when not asleep. This helps prevent blood clots.  You may return to work once you are authorized by your doctor.     WEIGHT BEARING  Weightbearing as tolerated on the right.   EXERCISES Ambulation  training with a walker.  Activities of daily living with Occupational Therapy.  CONSTIPATION  Constipation is defined medically as fewer than three stools per week and severe constipation as less than one stool per week.   Even if you have a regular bowel pattern at home, your normal regimen is likely to be disrupted due to multiple reasons following surgery.  Combination of anesthesia, postoperative narcotics, change in appetite and fluid intake all can affect your bowels.   YOU MUST use at least one of the following options; they are listed in order of increasing strength to get the job done.  They are all available over the counter, and you may need to use some, POSSIBLY even all of these options:    Drink plenty of fluids (prune juice may be helpful) and high fiber foods Colace 100 mg by mouth twice a day  Senokot for constipation as directed and as needed Dulcolax (bisacodyl ), take with full glass of water  Miralax  (polyethylene glycol) once or twice a day as needed.  If you have tried all these things and are unable to have a bowel movement in the first 3-4 days after surgery call either your surgeon or your primary doctor.    If you experience loose stools or diarrhea, hold the medications until you stool forms back up.  If your symptoms do not get better within 1 week or if they get worse, check with your doctor.  If you experience the worst abdominal pain ever or develop nausea or vomiting, please contact the office immediately for further recommendations for treatment.   ITCHING:  If you experience itching with your medications, try taking only a single pain pill, or even half a pain pill at a time.  You can also use Benadryl over the counter for itching or also to help with sleep.   TED HOSE STOCKINGS:  Use stockings on both legs until for at least 2 weeks or as directed by physician office. They may be removed at night for sleeping.  MEDICATIONS:  See your medication summary on the "After Visit Summary" that nursing will review with you.  You may have some home medications which will be placed on hold until you complete the course of blood thinner medication.  It is important for you to complete the  blood thinner medication as prescribed.  PRECAUTIONS:  If you experience chest pain or shortness of breath - call 911 immediately for transfer to the hospital emergency department.   If you develop a fever greater that 101 F, purulent drainage from wound, increased redness or drainage from wound, foul odor from the wound/dressing, or calf pain - CONTACT YOUR SURGEON.                                                   FOLLOW-UP APPOINTMENTS:  If you do not already have a post-op appointment, please call the office for an appointment to be seen by your surgeon.  Guidelines for how soon to be seen are listed in your "After Visit Summary", but are typically between 1-4 weeks after surgery.  OTHER INSTRUCTIONS:     MAKE SURE YOU:  Understand these instructions.  Get help right away if you are not doing well or get worse.    Thank you for letting us  be a part of your medical care team.  It is a privilege we respect greatly.  We hope these instructions will help you stay on track for a fast and full recovery!

## 2024-02-27 NOTE — TOC CM/SW Note (Signed)
 Transition of Care [facility]) - Inpatient Brief Assessment   Patient Details  Name: Pam Keller MRN: [id number] Date of Birth: August 12, 1940  Transition [facility] CM/SW Contact:    Corean ONEIDA Haddock, RN Phone Number: 02/27/2024, 10:11 AM   Clinical Narrative:  Patient admitted from [facility] Plan for ortho repair of femoral neck fracture today.  Requested patient to be seen by therapy when medically appropriate  Per SDOH social isolation resources added to AVS  Transition of Care Asessment: Insurance and Status: Insurance coverage has been reviewed Patient has primary care physician: Yes       Social Drivers of Health Review: SDOH reviewed interventions complete Readmission risk has been reviewed: Yes Transition of care needs: transition of care needs identified, TOC will continue to follow

## 2024-02-28 DIAGNOSIS — S72001A Fracture of unspecified part of neck of right femur, initial encounter for closed fracture: Secondary | ICD-10-CM | POA: Diagnosis not present

## 2024-02-28 LAB — CBC
HCT: 28.3 % — ABNORMAL LOW (ref 36.0–46.0)
Hemoglobin: 9 g/dL — ABNORMAL LOW (ref 12.0–15.0)
MCH: 30 pg (ref 26.0–34.0)
MCHC: 31.8 g/dL (ref 30.0–36.0)
MCV: 94.3 fL (ref 80.0–100.0)
Platelets: 173 K/uL (ref 150–400)
RBC: 3 MIL/uL — ABNORMAL LOW (ref 3.87–5.11)
RDW: 17.4 % — ABNORMAL HIGH (ref 11.5–15.5)
WBC: 8.7 K/uL (ref 4.0–10.5)
nRBC: 0 % (ref 0.0–0.2)

## 2024-02-28 LAB — BASIC METABOLIC PANEL WITH GFR
Anion gap: 6 (ref 5–15)
BUN: 19 mg/dL (ref 8–23)
CO2: 28 mmol/L (ref 22–32)
Calcium: 8.8 mg/dL — ABNORMAL LOW (ref 8.9–10.3)
Chloride: 105 mmol/L (ref 98–111)
Creatinine, Ser: 0.92 mg/dL (ref 0.44–1.00)
GFR, Estimated: >60 mL/min (ref >60)
Glucose, Bld: 125 mg/dL — ABNORMAL HIGH (ref 70–99)
Potassium: 4 mmol/L (ref 3.5–5.1)
Sodium: 139 mmol/L (ref 135–145)

## 2024-02-28 LAB — IRON AND TIBC
Iron: 19 ug/dL — ABNORMAL LOW (ref 28–170)
Saturation Ratios: 7 % — ABNORMAL LOW (ref 10.4–31.8)
TIBC: 290 ug/dL (ref 250–450)
UIBC: 271 ug/dL

## 2024-02-28 LAB — VITAMIN D 25 HYDROXY (VIT D DEFICIENCY, FRACTURES): Vit D, 25-Hydroxy: 49.36 ng/mL (ref 30–100)

## 2024-02-28 LAB — FERRITIN: Ferritin: 158 ng/mL (ref 11–307)

## 2024-02-28 MED ORDER — LOSARTAN POTASSIUM 50 MG PO TABS
100.0000 mg | ORAL_TABLET | Freq: Every day | ORAL | Status: DC
  Administered 2024-02-28 – 2024-03-03 (×5): 100 mg via ORAL
  Filled 2024-02-28 (×5): qty 2

## 2024-02-28 MED ORDER — HYDRALAZINE HCL 20 MG/ML IJ SOLN
10.0000 mg | Freq: Once | INTRAMUSCULAR | Status: AC
  Administered 2024-02-28: 10 mg via INTRAVENOUS
  Filled 2024-02-28: qty 1

## 2024-02-28 MED ORDER — TRAZODONE HCL 50 MG PO TABS
25.0000 mg | ORAL_TABLET | Freq: Every day | ORAL | Status: DC
  Administered 2024-02-28 – 2024-03-02 (×4): 25 mg via ORAL
  Filled 2024-02-28 (×4): qty 1

## 2024-02-28 MED ORDER — POLYETHYLENE GLYCOL 3350 17 G PO PACK
17.0000 g | PACK | Freq: Every day | ORAL | Status: DC
  Administered 2024-02-29 – 2024-03-02 (×3): 17 g via ORAL
  Filled 2024-02-28 (×4): qty 1

## 2024-02-28 MED ORDER — IRON SUCROSE 300 MG IVPB - SIMPLE MED
300.0000 mg | Freq: Once | Status: AC
  Administered 2024-02-28: 300 mg via INTRAVENOUS
  Filled 2024-02-28: qty 300

## 2024-02-28 MED ORDER — FERROUS SULFATE 325 (65 FE) MG PO TABS
325.0000 mg | ORAL_TABLET | Freq: Every day | ORAL | Status: DC
  Administered 2024-02-29 – 2024-03-03 (×4): 325 mg via ORAL
  Filled 2024-02-28 (×4): qty 1

## 2024-02-28 NOTE — Progress Notes (Signed)
 1455 New IV placed. Will start IV abx and IV iron .

## 2024-02-28 NOTE — Evaluation (Signed)
 Physical Therapy Evaluation Patient Details Name: Pam Keller MRN: [id number] DOB: 04/02/1941 Today's Date: 02/28/2024  History of Present Illness  83 y.o. female with a past medical history of severe dementia, hyperlipidemia, and hypertension who presented to the emergency department after an unwitnessed fall at her memory care unit.  Found to have R hip fx and is s/p ORIF pinning.  Pt had a fall with R shoulder fx in June.  Clinical Impression  Difficult eval as pt was extremely confused and struggled to follow even simple instructions (eg. Let go of the gown Put your hand on the walker, etc.)  She did show relatively good strength and WBing tolerance on the R with assisted standing effort but was too confused to appropriately use walker or attempt getting away from the bed.  Pt will need continued PT to address functional limitations, confusion was a significant limiter today.       If plan is discharge home, recommend the following: A lot of help with walking and/or transfers;A lot of help with bathing/dressing/bathroom;Assistance with feeding;Assist for transportation;Supervision due to cognitive status   Can travel by private vehicle   No    Equipment Recommendations  (has equipment, does not typically use)  Recommendations for Other Services       Functional Status Assessment Patient has had a recent decline in their functional status and demonstrates the ability to make significant improvements in function in a reasonable and predictable amount of time.     Precautions / Restrictions Precautions Precautions: Fall Recall of Precautions/Restrictions: Impaired Restrictions Weight Bearing Restrictions Per Provider Order: Yes RLE Weight Bearing Per Provider Order: Weight bearing as tolerated      Mobility  Bed Mobility Overal bed mobility: Needs Assistance Bed Mobility: Sit to Supine, Sit to Sidelying       Sit to supine: Mod assist Sit to sidelying: Mod  assist General bed mobility comments: Pt too confused to initiate movement to EOB/up to sitting.    Transfers Overall transfer level: Needs assistance Equipment used: Rolling walker (2 wheels), 2 person hand held assist Transfers: Sit to/from Stand Sit to Stand: Mod assist           General transfer comment: Pt unable to initiate upward movement, did show effort when directly assisted to start upward movement    Ambulation/Gait Ambulation/Gait assistance:  (no true ambulation - managed some confused side stepping along EOB with varying degrees of UE use on walker/HHA)                Stairs            Wheelchair Mobility     Tilt Bed    Modified Rankin (Stroke Patients Only)       Balance Overall balance assessment: Needs assistance Sitting-balance support: Bilateral upper extremity supported Sitting balance-Leahy Scale: Fair Sitting balance - Comments: once assisted to EOB pt was able to maintain upright sitting   Standing balance support: Bilateral upper extremity supported Standing balance-Leahy Scale: Poor Standing balance comment: Pt confused and leaning back much of the time, does not make cued adjustements with any consistency                             Pertinent Vitals/Pain Pain Assessment Pain Assessment: Faces Faces Pain Scale: Hurts a little bit Pain Location: Pt displays only very minimal hesitancy with R hip movement    Home Living Family/patient expects to be discharged to:: Skilled  nursing facility (likely return to the familiar setting of [facility])                   Additional Comments: Assisted living, has additional AD/equiptment if needed    Prior Function Prior Level of Function : Needs assist  Cognitive Assist : Mobility (cognitive)           Mobility Comments: Able to walk without an AD around the unit.  Since fall in June has apparently been doing PT and having a Teacher, English as a foreign language. ADLs  Comments: Required additional support for bathing and encouragement to perform personal hygiene, bathing, and dressing.     Extremity/Trunk Assessment   Upper Extremity Assessment Upper Extremity Assessment: Generalized weakness;Difficult to assess due to impaired cognition (did not seem too hesitant to use R UE 3 months post fx, good drip strength (holding gown, PT's hand, walker) and often needing PT to pry fingers off grasped objects)    Lower Extremity Assessment Lower Extremity Assessment: Generalized weakness;Difficult to assess due to impaired cognition (she did manage to do R SLR (not when cued) displayed no buckling during standing EOB)       Communication   Communication Communication: Impaired Factors Affecting Communication: Difficulty expressing self    Cognition Arousal:  (active/awake - profoundly confused) Behavior During Therapy: Restless   PT - Cognitive impairments:  (severely limited communication 2/2 dementia)                       PT - Cognition Comments: Pt unable to answer any questions appropriate, excessive cuing for following directions Following commands: Impaired Following commands impaired:  (unable to follow commands with any consistency)     Cueing       General Comments General comments (skin integrity, edema, etc.): Pt was profoundly confused, seemingly in good spirits but often grabbing/holding gown, sheets, PT's hand, etc and unable to let go    Exercises     Assessment/Plan    PT Assessment Patient needs continued PT services  PT Problem List Decreased strength;Decreased range of motion;Decreased activity tolerance;Decreased balance;Decreased mobility;Decreased coordination;Decreased cognition;Decreased knowledge of use of DME;Decreased safety awareness       PT Treatment Interventions Functional mobility training;DME instruction;Gait training;Therapeutic activities;Therapeutic exercise;Balance training;Cognitive  remediation;Patient/family education    PT Goals (Current goals can be found in the Care Plan section)  Acute Rehab PT Goals Patient Stated Goal: Daughter hoping to get get back to walking, wishes to return to [employer] PT Goal Formulation: With family Time For Goal Achievement: 03/12/24 Potential to Achieve Goals: Fair    Frequency 7X/week     Co-evaluation               AM-PAC PT 6 Clicks Mobility  Outcome Measure Help needed turning from your back to your side while in a flat bed without using bedrails?: A Lot Help needed moving from lying on your back to sitting on the side of a flat bed without using bedrails?: A Lot Help needed moving to and from a bed to a chair (including a wheelchair)?: A Lot Help needed standing up from a chair using your arms (e.g., wheelchair or bedside chair)?: A Lot Help needed to walk in hospital room?: A Lot Help needed climbing 3-5 steps with a railing? : A Lot 6 Click Score: 12    End of Session Equipment Utilized During Treatment: Gait belt Activity Tolerance: Patient tolerated treatment well (cognition was a huge limiter) Patient  left: in bed;with call bell/phone within reach;with nursing/sitter in room;with family/visitor present Nurse Communication: Mobility status PT Visit Diagnosis: Muscle weakness (generalized) (M62.81);Difficulty in walking, not elsewhere classified (R26.2);Unsteadiness on feet (R26.81)    Time: 8958-8897 PT Time Calculation (min) (ACUTE ONLY): 21 min   Charges:   PT Evaluation $PT Eval Low Complexity: 1 Low PT Treatments $Therapeutic Activity: 8-22 mins PT General Charges $$ ACUTE PT VISIT: 1 Visit         Carmin JONELLE Deed, DPT 02/28/2024, 11:37 AM

## 2024-02-28 NOTE — Progress Notes (Signed)
 Progress Note   Patient: Pam Keller FMW:[id number] DOB: 1941-06-12 DOA: 02/26/2024     2 DOS: the patient was seen and examined on 02/28/2024   Brief hospital course: Per H&P HPI  HPI: Pam Keller is a 83 y.o. female with medical history significant of dementia, hyperlipidemia, basal cell carcinoma, essential hypertension, history of pulmonary nodule, who was brought in from [facility] skilled facility after sustaining a mechanical fall.  Patient resides in the memory care unit over there.  She has right hip pain.  She normally ambulates but suspected to have the unwitnessed fall.  No one knows how long she was down.  Patient is accompanied by the daughter here.  She has had previous fall with right humeral fracture from June of this year which is healing.  She is apparently a DNI DNR at some point.  Not on any blood thinners.  Family want patient to proceed with surgery because she has been able to walk.  Dr. Tobie orthopedics consulted and will do surgery tomorrow.  We are admitting patient to the medical service.    Assessment and Plan: Right subcapsular nondisplaced femoral neck fracture:  S/p repair 9/5 Vit D WNL Pain control PT/OT  Advanced dementia  More alert this AM. Minimally verbal.  Did have agitation ON.  Resume home trazodone   Continue donepizil    HTN Continue home regimen  HLD  On lipitor  Normocytic anemia  Iron /TIBC/Ferritin/ %Sat    Component Value Date/Time   IRON  19 (L) 02/28/2024 0608   TIBC 290 02/28/2024 0608   FERRITIN 158 02/28/2024 0608   IRONPCTSAT 7 (L) 02/28/2024 0608   Gave dose of venofer . Resume PO iron           Subjective: Drowsy not very itneractive. Daughters at bedside states that patient able to state whether in pain.  Usually eats by herself. Can ambulate.   Physical Exam: Vitals:   02/28/24 0435 02/28/24 0740 02/28/24 1534 02/28/24 1537  BP: (!) 171/89 (!) 146/68 (!) 144/66   Pulse: 84 93 99   Resp: 18 16 18     Temp:  97.8 F (36.6 C) 98.1 F (36.7 C)   TempSrc:   Oral   SpO2: 100% 92% 92%   Weight:    61.3 kg    Constitutional: In no distress.  Cardiovascular: Normal rate, regular rhythm. No lower extremity edema  Pulmonary: Non labored breathing on room air  Abdominal: Soft. Non distended     Neurological: Opens eyes spontaneously does not communicate otherwise.  Skin: Skin is warm and dry.   Data Reviewed:     Latest Ref Rng & Units 02/28/2024    6:08 AM 02/27/2024    4:31 AM 02/26/2024    8:26 PM  BMP  Glucose 70 - 99 mg/dL 874  870  869   BUN 8 - 23 mg/dL 19  20  23    Creatinine 0.44 - 1.00 mg/dL 9.07  8.99  8.92   Sodium 135 - 145 mmol/L 139  139  138   Potassium 3.5 - 5.1 mmol/L 4.0  4.0  4.0   Chloride 98 - 111 mmol/L 105  102  100   CO2 22 - 32 mmol/L 28  26  25    Calcium  8.9 - 10.3 mg/dL 8.8  9.0  9.6       Latest Ref Rng & Units 02/28/2024    6:08 AM 02/27/2024    4:31 AM 02/26/2024    8:26 PM  CBC  WBC 4.0 - 10.5 K/uL 8.7  8.9  11.0   Hemoglobin 12.0 - 15.0 g/dL 9.0  9.2  89.4   Hematocrit 36.0 - 46.0 % 28.3  28.9  32.5   Platelets 150 - 400 K/uL 173  194  204      Family Communication: Daughters at bedside.   Disposition: Status is: Inpatient Remains inpatient appropriate because: hip fx repair   Planned Discharge Destination: H/H at facility v SNF     Time spent: 35 minutes  Author: Alban Pepper, MD 02/28/2024 6:18 PM  For on call review [email or web address].

## 2024-02-28 NOTE — Progress Notes (Signed)
 Subjective: 1 Day Post-Op Procedure(s) (LRB): FIXATION, FEMUR, NECK, PERCUTANEOUS, USING SCREW (Right) Patient reports pain as mild.   Patient is well, and has had no acute complaints or problems Difficulty answering prompted questions secondary to history of dementia. We will start therapy today.  Plan is to go rehab after hospital stay.  Objective: Vital signs in last 24 hours: Temp:  [97.2 F (36.2 C)-98.3 F (36.8 C)] 97.8 F (36.6 C) (09/06 0740) Pulse Rate:  [70-106] 93 (09/06 0740) Resp:  [14-23] 16 (09/06 0740) BP: (146-204)/(68-131) 146/68 (09/06 0740) SpO2:  [92 %-100 %] 92 % (09/06 0740) Weight:  [61 kg] 61 kg (09/05 1900)  Intake/Output from previous day:  Intake/Output Summary (Last 24 hours) at 02/28/2024 0911 Last data filed at 02/28/2024 0506 Gross per 24 hour  Intake 2477.27 ml  Output 550 ml  Net 1927.27 ml    Intake/Output this shift: No intake/output data recorded.  Labs: Recent Labs    02/26/24 2026 02/27/24 0431 02/28/24 0608  HGB 10.5* 9.2* 9.0*   Recent Labs    02/27/24 0431 02/28/24 0608  WBC 8.9 8.7  RBC 3.08* 3.00*  HCT 28.9* 28.3*  PLT 194 173   Recent Labs    02/27/24 0431 02/28/24 0608  NA 139 139  K 4.0 4.0  CL 102 105  CO2 26 28  BUN 20 19  CREATININE 1.00 0.92  GLUCOSE 129* 125*  CALCIUM  9.0 8.8*   Recent Labs    02/26/24 2233  INR 1.1    EXAM General - Patient is Alert, Appropriate, and Oriented Extremity - Neurologically intact Neurovascular intact Sensation intact distally Intact pulses distally Dorsiflexion/Plantar flexion intact No cellulitis present Compartment soft Dressing - Dressing clean, scant drainage Motor Function - intact, moving foot and toes well on exam.  Past Medical History:  Diagnosis Date   Basal cell carcinoma    Dementia (HCC)    Hyperlipidemia    Hypertension    Microscopic hematuria 06/24/2008   negative workuo   Pulmonary nodule     Assessment/Plan: 1 Day Post-Op  Procedure(s) (LRB): FIXATION, FEMUR, NECK, PERCUTANEOUS, USING SCREW (Right) Principal Problem:   Left displaced femoral neck fracture (HCC) Active Problems:   Hyperlipidemia   Hypertension   Memory impairment   Anxiety and depression  Estimated body mass index is 23.82 kg/m as calculated from the following:   Height as of 12/18/23: 5' 3 (1.6 m).   Weight as of this encounter: 61 kg. Advance diet Up with therapy  Patient has dementia at baseline, daughter present during visit  Patient will continue to work with physical therapy. Weight-Bearing as tolerated to right leg  VSS and labs show hgb at 9.0 consistent with postoperative blood loss, iron  slightly low at 19. Continue to trend  DVT prophylaxis: Lovenox  and SCDs Patient will follow-up with [facility] clinic orthopedics in 2 weeks for re-imaging and reevaluation   Fonda Koyanagi, PA-C [facility] 02/28/2024, 9:11 AM

## 2024-02-28 NOTE — Evaluation (Signed)
 Occupational Therapy Evaluation Patient Details Name: Louana E Yablonski MRN: [id number] DOB: 04/11/1941 Today's Date: 02/28/2024   History of Present Illness   83 y.o. female with a past medical history of severe dementia, hyperlipidemia, and hypertension who presented to the emergency department after an unwitnessed fall at her memory care unit.  Found to have R hip fx and is s/p ORIF pinning.  Pt had a fall with R shoulder fx in June.     Clinical Impressions Patient was seen for an OT evaluation this date. Pt lives in Memory Care Unit at [facility] where she was receiving assistance with all ADLs as needed. Following a fall and R shoulder fx in June, pt has had 1-1 sitter at [facility]. During today's session, pt demonstrates profound confusion. She is oriented to self only, is unable to follow any directions, has no awareness of safety issues. She does not respond to any questions concerning pain, but generally does not appear to be in distress other then when her L LE is directly mobilized. Pt requires constant tactile cueing and Max A +2 assistance for standing, is unable to understand how to use RW. Pt has pulled out both her IV and purewick. When coming into standing, pt urinates on self, therapist, floor, bedding. She is unable to follow any directions for participating in linen change, gown change, positioning in bed. She is unable to roll in bed. Requires Max A for all bed mobility. Pt will benefit from OT services while hospitalized. Recommend ongoing rehab services, <3 hrs/day, post DC.     If plan is discharge home, recommend the following:   A lot of help with walking and/or transfers;A lot of help with bathing/dressing/bathroom;Assistance with feeding;Assist for transportation;Direct supervision/assist for medications management;Help with stairs or ramp for entrance;Assistance with cooking/housework;Direct supervision/assist for financial  management;Supervision due to cognitive status     Functional Status Assessment   Patient has had a recent decline in their functional status and demonstrates the ability to make significant improvements in function in a reasonable and predictable amount of time.     Equipment Recommendations   None recommended by OT     Recommendations for Other Services         Precautions/Restrictions   Precautions Precautions: Fall Recall of Precautions/Restrictions: Impaired Restrictions Weight Bearing Restrictions Per Provider Order: Yes RLE Weight Bearing Per Provider Order: Weight bearing as tolerated     Mobility Bed Mobility Overal bed mobility: Needs Assistance Bed Mobility: Sit to Supine, Supine to Sit     Supine to sit: Max assist Sit to supine: Max assist   General bed mobility comments: Pt too confused to initiate movement to EOB/up to sitting.    Transfers Overall transfer level: Needs assistance Equipment used: 2 person hand held assist Transfers: Sit to/from Stand Sit to Stand: Max assist, +2 physical assistance           General transfer comment: Pt unable to follow any directions for use of RW and other safety concerns      Balance Overall balance assessment: Needs assistance Sitting-balance support: Bilateral upper extremity supported Sitting balance-Leahy Scale: Fair     Standing balance support: Bilateral upper extremity supported Standing balance-Leahy Scale: Poor Standing balance comment: Required Max A to maintain standing balance                           ADL either performed or assessed with clinical judgement  ADL Overall ADL's : Needs assistance/impaired Eating/Feeding: Moderate assistance       Upper Body Bathing: Maximal assistance   Lower Body Bathing: Maximal assistance   Upper Body Dressing : Moderate assistance Upper Body Dressing Details (indicate cue type and reason): changing gown Lower Body Dressing:  Maximal assistance Lower Body Dressing Details (indicate cue type and reason): donning socks                     Vision         Perception         Praxis         Pertinent Vitals/Pain Pain Assessment Pain Assessment: PAINAD Breathing: occasional labored breathing, short period of hyperventilation Negative Vocalization: occasional moan/groan, low speech, negative/disapproving quality Facial Expression: smiling or inexpressive Body Language: tense, distressed pacing, fidgeting Consolability: distracted or reassured by voice/touch PAINAD Score: 4 Pain Location: Yells and grimaces with some R hip movement Pain Intervention(s): Repositioned, Premedicated before session, Limited activity within patient's tolerance     Extremity/Trunk Assessment Upper Extremity Assessment Upper Extremity Assessment: Generalized weakness;Difficult to assess due to impaired cognition   Lower Extremity Assessment Lower Extremity Assessment: Generalized weakness;Difficult to assess due to impaired cognition       Communication Communication Communication: Impaired Factors Affecting Communication: Difficulty expressing self   Cognition Arousal: Alert Behavior During Therapy: Restless Cognition: History of cognitive impairments             OT - Cognition Comments: Oriented to self only, unable to follow any directions                 Following commands: Impaired       Cueing  General Comments          Exercises Other Exercises Other Exercises: Educ w/ daughters re: plan of care, DC recs   Shoulder Instructions      Home Living Family/patient expects to be discharged to:: Skilled nursing facility                                 Additional Comments: Lives at [facility], memory care unit      Prior Functioning/Environment Prior Level of Function : Needs assist             Mobility Comments: Able to walk without an AD around the unit.   Since fall in June has apparently been doing PT and having a Teacher, English as a foreign language. ADLs Comments: Required additional support for bathing and encouragement to perform personal hygiene, bathing, and dressing.    OT Problem List: Decreased strength;Decreased range of motion;Impaired balance (sitting and/or standing);Decreased activity tolerance;Decreased knowledge of use of DME or AE;Decreased cognition   OT Treatment/Interventions: Self-care/ADL training;Therapeutic exercise;Patient/family education;Balance training;Therapeutic activities;DME and/or AE instruction;Cognitive remediation/compensation      OT Goals(Current goals can be found in the care plan section)   Acute Rehab OT Goals Patient Stated Goal: To go back to [facility] with sitters OT Goal Formulation: With patient/family Time For Goal Achievement: 03/13/24 Potential to Achieve Goals: Good ADL Goals Pt Will Perform Eating: sitting;with set-up Pt Will Perform Grooming: with supervision;sitting Pt Will Transfer to Toilet: with contact guard assist   OT Frequency:  Min 1X/week    Co-evaluation              AM-PAC OT 6 Clicks Daily Activity     Outcome Measure Help from another person eating meals?: A  Little Help from another person taking care of personal grooming?: A Lot Help from another person toileting, which includes using toliet, bedpan, or urinal?: A Lot Help from another person bathing (including washing, rinsing, drying)?: A Lot Help from another person to put on and taking off regular upper body clothing?: A Lot Help from another person to put on and taking off regular lower body clothing?: A Lot 6 Click Score: 13   End of Session Equipment Utilized During Treatment: Rolling walker (2 wheels)  Activity Tolerance: Other (comment) (Pt limited 2/2 confusion, cognitive impairment) Patient left: in bed;with call bell/phone within reach;with bed alarm set;with family/visitor present  OT Visit Diagnosis:  Unsteadiness on feet (R26.81);Other abnormalities of gait and mobility (R26.89);Muscle weakness (generalized) (M62.81);Repeated falls (R29.6);Cognitive communication deficit (R41.841)                Time: 1358-1440 OT Time Calculation (min): 42 min Charges:  OT General Charges $OT Visit: 1 Visit OT Evaluation $OT Eval Moderate Complexity: 1 Mod OT Treatments $Self Care/Home Management : 23-37 mins Suzen Hock, PhD, MS, OTR/L 02/28/24, 3:10 PM

## 2024-02-29 DIAGNOSIS — S72001A Fracture of unspecified part of neck of right femur, initial encounter for closed fracture: Secondary | ICD-10-CM | POA: Diagnosis not present

## 2024-02-29 LAB — BASIC METABOLIC PANEL WITH GFR
Anion gap: 13 (ref 5–15)
BUN: 25 mg/dL — ABNORMAL HIGH (ref 8–23)
CO2: 25 mmol/L (ref 22–32)
Calcium: 8.8 mg/dL — ABNORMAL LOW (ref 8.9–10.3)
Chloride: 105 mmol/L (ref 98–111)
Creatinine, Ser: 0.98 mg/dL (ref 0.44–1.00)
GFR, Estimated: 57 mL/min — ABNORMAL LOW (ref >60)
Glucose, Bld: 96 mg/dL (ref 70–99)
Potassium: 3.7 mmol/L (ref 3.5–5.1)
Sodium: 143 mmol/L (ref 135–145)

## 2024-02-29 LAB — CBC
HCT: 25.6 % — ABNORMAL LOW (ref 36.0–46.0)
Hemoglobin: 8 g/dL — ABNORMAL LOW (ref 12.0–15.0)
MCH: 30 pg (ref 26.0–34.0)
MCHC: 31.3 g/dL (ref 30.0–36.0)
MCV: 95.9 fL (ref 80.0–100.0)
Platelets: 178 K/uL (ref 150–400)
RBC: 2.67 MIL/uL — ABNORMAL LOW (ref 3.87–5.11)
RDW: 17.8 % — ABNORMAL HIGH (ref 11.5–15.5)
WBC: 8.4 K/uL (ref 4.0–10.5)
nRBC: 0 % (ref 0.0–0.2)

## 2024-02-29 MED ORDER — ENOXAPARIN SODIUM 40 MG/0.4ML IJ SOSY: 40.0000 mg | PREFILLED_SYRINGE | INTRAMUSCULAR | 0 refills | Status: DC

## 2024-02-29 MED ORDER — MUPIROCIN 2 % EX OINT
1.0000 | TOPICAL_OINTMENT | Freq: Two times a day (BID) | CUTANEOUS | Status: DC
  Administered 2024-03-01 – 2024-03-03 (×6): 1 via NASAL
  Filled 2024-02-29: qty 22

## 2024-02-29 MED ORDER — DEXTROSE-SODIUM CHLORIDE 5-0.45 % IV SOLN: INTRAVENOUS | Status: AC

## 2024-02-29 MED ORDER — OXYCODONE HCL 5 MG PO TABS: 5.0000 mg | ORAL_TABLET | ORAL | 0 refills | Status: AC | PRN

## 2024-02-29 MED ORDER — IRON SUCROSE 300 MG IVPB - SIMPLE MED
300.0000 mg | Freq: Once | Status: AC
  Administered 2024-02-29: 300 mg via INTRAVENOUS
  Filled 2024-02-29: qty 300

## 2024-02-29 NOTE — Plan of Care (Signed)
 Alert to self only at start of the night. Over the last four hours of the night patient did not remember her name. Patient dislodged IV this am after receiving IVF during most of the night. She became more restless, agitated. Mitt on right hand during a good portion of the night d/t pulling at lines.Nurse stayed at bedside except for a few instances that other staff were able to monitor to prevent getting up on her own. Dressing C/D/I. Purplish bruising unchanged on posterior leg, lateral side and buttock. Bath, linen and gown changed during the night. Patient was incontinent- BM during the night. Assisted with feeding. Patient c/o pain - scheduled Tylenol  1x and oxycodone  1x during the night. Patient slept for short periods of time between care. Safety measures in place throughout the night.   Problem: Clinical Measurements: Goal: Respiratory complications will improve Outcome: Progressing   Problem: Elimination: Goal: Will not experience complications related to urinary retention Outcome: Progressing   Problem: Safety: Goal: Ability to remain free from injury will improve Outcome: Progressing

## 2024-02-29 NOTE — TOC Initial Note (Signed)
 Transition of Care [facility]) - Initial/Assessment Note    Patient Details  Name: Pam Keller MRN: [id number] Date of Birth: [birth year]  Transition of Care [facility]) CM/SW Contact:    Mahli Glahn E Analia Zuk, LCSW Phone Number: 02/29/2024, 9:06 AM  Clinical Narrative:                 CSW spoke with patient's daughter Pam Keller.  Patient resides at [facility] in their Memory Care Unit. Patient has 24/7 private pay sitters at [facility]. Patient has had HH therapy through [facility] recently.  Pam Keller states they are aware of therapy rec for SNF for STR. Pam Keller states they would prefer patient return to [facility] if possible because this is her familiar environment. Kim understands [facility] would need to confirm they can meet patients needs for her to return there and not go to STR. Patient has never been to SNF in the past. CSW called Rock at [facility]. Left a VM requesting a return call.    Expected Discharge Plan: Skilled Nursing Facility Barriers to Discharge: Continued Medical Work up   Patient Goals and CMS Choice   CMS Medicare.gov Compare Post Acute Care list provided to:: Patient Represenative (must comment) Choice offered to / list presented to : Adult Children      Expected Discharge Plan and Services       Living arrangements for the past 2 months: Assisted Living Facility                                      Prior Living Arrangements/Services Living arrangements for the past 2 months: Assisted Living Facility Lives with:: Facility Resident Patient language and need for interpreter reviewed:: Yes Do you feel safe going back to the place where you live?: Yes      Need for Family Participation in Patient Care: Yes (Comment) Care giver support system in place?: Yes (comment)   Criminal Activity/Legal Involvement Pertinent to Current Situation/Hospitalization: No - Comment as needed  Activities of Daily Living   ADL Screening (condition at time of  admission) Independently performs ADLs?: No Does the patient have a NEW difficulty with bathing/dressing/toileting/self-feeding that is expected to last >3 days?: Yes (Initiates electronic notice to provider for possible OT consult) Does the patient have a NEW difficulty with getting in/out of bed, walking, or climbing stairs that is expected to last >3 days?: Yes (Initiates electronic notice to provider for possible PT consult) Does the patient have a NEW difficulty with communication that is expected to last >3 days?: No Is the patient deaf or have difficulty hearing?: No Does the patient have difficulty seeing, even when wearing glasses/contacts?: No Does the patient have difficulty concentrating, remembering, or making decisions?: Yes  Permission Sought/Granted Permission sought to share information with : Oceanographer granted to share information with : Yes, Verbal Permission Granted (by daughter Pam Keller)     Permission granted to share info w AGENCY: [facility], [place], SNFs if needed        Emotional Assessment         Alcohol / Substance Use: Not Applicable Psych Involvement: No (comment)  Admission diagnosis:  Left displaced femoral neck fracture (HCC) [S72.002A] Fall, initial encounter [W19.XXXA] Closed fracture of right hip, initial encounter [facility]) [S72.001A] Patient Active Problem List   Diagnosis Date Noted   Left displaced femoral neck fracture (HCC) 02/26/2024   UTI (urinary tract  infection) 12/18/2023   Closed fracture of right proximal humerus 12/18/2023   Memory impairment 12/17/2014   Anxiety and depression 12/17/2014   Long-term use of high-risk medication 10/06/2014   Hypokalemia 11/19/2013   Chronic insomnia 10/05/2013   Carotid bruit present 10/04/2013   Occipital headache 09/29/2012   Neck stiffness 09/08/2012   Pseudophakia 04/23/2012   Screening for colon cancer 09/29/2011   Medicare annual wellness visit, subsequent  09/27/2011   Hyperlipidemia    Hypertension    Pulmonary nodule    Basal cell carcinoma    Screening for breast cancer 04/29/2011   Screening for cervical cancer 04/29/2011   History of postmenopausal HRT 04/29/2011   Microscopic hematuria    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 - [address] [place] [address] Phone: [phone] Fax: [phone]     Social Drivers of Health (SDOH) Social History: SDOH Screenings   Food Insecurity: No Food Insecurity (02/27/2024)  Housing: Low Risk  (02/27/2024)  Transportation Needs: No Transportation Needs (02/27/2024)  Utilities: Not At Risk (02/27/2024)  Financial Resource Strain: Low Risk  (01/09/2024)   Received from [facility]  Social Connections: Socially Isolated (02/27/2024)  Tobacco Use: Medium Risk (02/27/2024)   SDOH Interventions:     Readmission Risk Interventions     No data to display

## 2024-02-29 NOTE — Progress Notes (Signed)
 PT Cancellation Note  Patient Details Name: Pam Keller MRN: [id number] DOB: 12-07-1940   Cancelled Treatment:    Reason Eval/Treat Not Completed: Other (comment) Attempted to see pt x2 this AM.  First she had a NT helping her with direct feeding breakfast, on second attempt RN requests to let her rest as she was finally calm and sleeping.  Hope to be able to try again this afternoon and encourage activity/exercises/participation per pt status.   Carmin JONELLE Deed, DPT 02/29/2024, 11:46 AM

## 2024-02-29 NOTE — Progress Notes (Signed)
 Physical Therapy Treatment Patient Details Name: Pam Keller MRN: [id number] DOB: 02/05/1941 Today's Date: 02/29/2024   History of Present Illness 83 y.o. female with a past medical history of severe dementia, hyperlipidemia, and hypertension who presented to the emergency department after an unwitnessed fall at her memory care unit.  Found to have R hip fx and is s/p ORIF pinning.  Pt had a fall with R shoulder fx in June.    PT Comments  Pt continues to be significantly limited 2/2 dementia.  She had exquisite pain response on earlier attempt this afternoon, much better tolerance ~1 hr post pain meds, but still very much struggled to participate actively despite much multimodal cuing from PT and 2 very involved daughters given encouragement.  Pt displays some R LE strength but only on her own terms and showed little ability to do purposeful cued activities.  Pt will benefit from ongoing PT, continue with POC.    If plan is discharge home, recommend the following: A lot of help with walking and/or transfers;A lot of help with bathing/dressing/bathroom;Assistance with feeding;Assist for transportation;Supervision due to cognitive status   Can travel by private vehicle     No  Equipment Recommendations  Rolling walker (2 wheels)    Recommendations for Other Services       Precautions / Restrictions Precautions Precautions: Fall Recall of Precautions/Restrictions: Impaired Restrictions RLE Weight Bearing Per Provider Order: Weight bearing as tolerated     Mobility  Bed Mobility Overal bed mobility: Needs Assistance             General bed mobility comments: Pt was able to easily pull up to long sitting in bed but complete unable to do any cued mobility    Transfers                   General transfer comment: deferred 2/2 safety concerns given complete lack of ability to follow any cuing    Ambulation/Gait                   Stairs              Wheelchair Mobility     Tilt Bed    Modified Rankin (Stroke Patients Only)       Balance     Sitting balance-Leahy Scale: Good Sitting balance - Comments: maintains long sitting even with gentle cuing to try to get her to lay back down via direct assist.                                    Communication Communication Communication: Impaired Factors Affecting Communication: Difficulty expressing self  Cognition Arousal: Alert Behavior During Therapy: Restless                           PT - Cognition Comments: advanced dementia makes meaningful cuing/conversation/etc very limited Following commands: Impaired Following commands impaired:  (unable to follow even simple, multimodal commands)    Cueing Cueing Techniques: Verbal cues, Tactile cues, Gestural cues, Visual cues  Exercises General Exercises - Lower Extremity Ankle Circles/Pumps: AROM, 15 reps (pt is able to do rote motion ankle pumps) Quad Sets: Strengthening, 10 reps (able to elicit quad engagement with popliteal pressue) Short Arc Quad: PROM, AAROM, 10 reps (most reps were P/AAROM though she did randomly do ~2 reps with some AROM) Heel Slides: PROM, 10  reps (pt essentially resitant despite much cuing and showed reasonable leg ext/quad strength, does not participate with A/AAROM efforts) Hip ABduction/ADduction: PROM, 20 reps (repeated cues for AAROM, pt unable... pt was screaming out in pain on first afternoon attempt, much more tolerant post pain meds) Straight Leg Raises: PROM, 10 reps (unable to get any AROM response)    General Comments General comments (skin integrity, edema, etc.): Pt's severe dementia limited most meaningful PT interaction      Pertinent Vitals/Pain Pain Assessment Pain Assessment: Faces Faces Pain Scale: Hurts a little bit Pain Location: On first attempt to see pt this afternoon (minimal pain meds in system) she was intolerant of essentially all R hip  movement, on return ~1 hr post meds she showed much improved tolerance    Home Living                          Prior Function            PT Goals (current goals can now be found in the care plan section) Progress towards PT goals:  (slow progress)    Frequency    7X/week (per participation)      PT Plan      Co-evaluation              AM-PAC PT 6 Clicks Mobility   Outcome Measure  Help needed turning from your back to your side while in a flat bed without using bedrails?: A Lot Help needed moving from lying on your back to sitting on the side of a flat bed without using bedrails?: A Lot Help needed moving to and from a bed to a chair (including a wheelchair)?: Total Help needed standing up from a chair using your arms (e.g., wheelchair or bedside chair)?: Total Help needed to walk in hospital room?: Total Help needed climbing 3-5 steps with a railing? : Total 6 Click Score: 8    End of Session   Activity Tolerance:  (cognition is a huge limiter) Patient left: in bed;with call bell/phone within reach;with family/visitor present Nurse Communication: Mobility status PT Visit Diagnosis: Muscle weakness (generalized) (M62.81);Difficulty in walking, not elsewhere classified (R26.2);Unsteadiness on feet (R26.81)     Time: 8469-8444 PT Time Calculation (min) (ACUTE ONLY): 25 min  Charges:    $Therapeutic Exercise: 23-37 mins PT General Charges $$ ACUTE PT VISIT: 1 Visit                     Carmin JONELLE Deed, DPT 02/29/2024, 4:34 PM

## 2024-02-29 NOTE — Progress Notes (Signed)
 Subjective: 2 Days Post-Op Procedure(s) (LRB): FIXATION, FEMUR, NECK, PERCUTANEOUS, USING SCREW (Right) Patient is asleep upon provider arrival with sitter present in room. States had a long night and just got to sleep  Patient appears well, and has had no acute complaints or problems History of dementia. We will continue therapy today.  Plan is to go rehab after hospital stay.  Objective: Vital signs in last 24 hours: Temp:  [98 F (36.7 C)-99.5 F (37.5 C)] 98 F (36.7 C) (09/07 0236) Pulse Rate:  [65-99] 65 (09/07 0236) Resp:  [15-18] 16 (09/07 0236) BP: (138-169)/(66-73) 169/69 (09/07 0236) SpO2:  [86 %-95 %] 93 % (09/07 0236) Weight:  [61.3 kg] 61.3 kg (09/06 1537)  Intake/Output from previous day:  Intake/Output Summary (Last 24 hours) at 02/29/2024 0854 Last data filed at 02/28/2024 1715 Gross per 24 hour  Intake 621.52 ml  Output --  Net 621.52 ml    Intake/Output this shift: No intake/output data recorded.  Labs: Recent Labs    02/26/24 2026 02/27/24 0431 02/28/24 0608 02/29/24 0627  HGB 10.5* 9.2* 9.0* 8.0*   Recent Labs    02/28/24 0608 02/29/24 0627  WBC 8.7 8.4  RBC 3.00* 2.67*  HCT 28.3* 25.6*  PLT 173 178   Recent Labs    02/28/24 0608 02/29/24 0627  NA 139 143  K 4.0 3.7  CL 105 105  CO2 28 25  BUN 19 25*  CREATININE 0.92 0.98  GLUCOSE 125* 96  CALCIUM  8.8* 8.8*   Recent Labs    02/26/24 2233  INR 1.1    EXAM General - Patient is Alert, Appropriate, and Oriented Extremity - Neurologically intact Neurovascular intact Sensation intact distally Intact pulses distally Dorsiflexion/Plantar flexion intact No cellulitis present Compartment soft Dressing - Dressing clean, scant drainage Motor Function - intact, moving foot and toes well on exam.  Past Medical History:  Diagnosis Date   Basal cell carcinoma    Dementia (HCC)    Hyperlipidemia    Hypertension    Microscopic hematuria 06/24/2008   negative workuo   Pulmonary  nodule     Assessment/Plan: 2 Days Post-Op Procedure(s) (LRB): FIXATION, FEMUR, NECK, PERCUTANEOUS, USING SCREW (Right) Principal Problem:   Left displaced femoral neck fracture (HCC) Active Problems:   Hyperlipidemia   Hypertension   Memory impairment   Anxiety and depression  Estimated body mass index is 23.94 kg/m as calculated from the following:   Height as of 12/18/23: 5' 3 (1.6 m).   Weight as of this encounter: 61.3 kg. Advance diet Up with therapy  Patient has dementia at baseline, sitter present during visit  Patient will continue to work with physical therapy. Weight-Bearing as tolerated to right leg  VSS and labs show hgb at 8.0 consistent with postoperative blood loss. Continue to trend  DVT prophylaxis: Lovenox  and SCDs, will continue lovenox  for 2 weeks with discharge Patient will follow-up with [facility] clinic orthopedics in 2 weeks for re-imaging and reevaluation   Fonda Koyanagi, PA-C [facility] 02/29/2024, 8:54 AM

## 2024-02-29 NOTE — Progress Notes (Signed)
 Progress Note   Patient: Pam Keller FMW:[id number] DOB: Jul 12, 1940 DOA: 02/26/2024     3 DOS: the patient was seen and examined on 02/29/2024   Brief hospital course: Per H&P HPI  HPI: Pam Keller is a 83 y.o. female with medical history significant of dementia, hyperlipidemia, basal cell carcinoma, essential hypertension, history of pulmonary nodule, who was brought in from [facility] skilled facility after sustaining a mechanical fall.  Patient resides in the memory care unit over there.  She has right hip pain.  She normally ambulates but suspected to have the unwitnessed fall.  No one knows how long she was down.  Patient is accompanied by the daughter here.  She has had previous fall with right humeral fracture from June of this year which is healing.  She is apparently a DNI DNR at some point.  Not on any blood thinners.  Family want patient to proceed with surgery because she has been able to walk.  Dr. Tobie orthopedics consulted and will do surgery tomorrow.  We are admitting patient to the medical service.    Assessment and Plan: Right subcapsular nondisplaced femoral neck fracture:  S/p repair 9/5 Vit D WNL Pain control PT/OT  Advanced dementia  More alert this AM.  Had some agitation again overnight. Continue home trazodone   Continue donepizil  Patient family can discuss with PCP about removing some of her medications.  HTN Within normal limits.  Continue home losartan  and hydralazine .    02/29/2024    2:36 AM 02/28/2024    8:26 PM 02/28/2024    3:37 PM  Vitals with BMI  Weight   135 lbs 2 oz  Systolic 169 138   Diastolic 69 73   Pulse 65 78      HLD  On lipitor.  Normocytic anemia  Iron /TIBC/Ferritin/ %Sat    Component Value Date/Time   IRON  19 (L) 02/28/2024 0608   TIBC 290 02/28/2024 0608   FERRITIN 158 02/28/2024 0608   IRONPCTSAT 7 (L) 02/28/2024 0608  Hemoglobin down in setting of recent OR. Status post Venofer .  Continue p.o.  iron          Subjective: Patient sitting up in bed and alert.Had agitation ON.  Physical Exam: Vitals:   02/28/24 1537 02/28/24 2022 02/28/24 2026 02/29/24 0236  BP:   138/73 (!) 169/69  Pulse:   78 65  Resp:   15 16  Temp:   99.5 F (37.5 C) 98 F (36.7 C)  TempSrc:   Oral Oral  SpO2:  (!) 86% 95% 93%  Weight: 61.3 kg        Constitutional: In no distress.  Cardiovascular: irregular rhythm. No lower extremity edema  Pulmonary: Non labored breathing on room air, no wheezing or rales.   Abdominal: Soft. Non distended  Musculoskeletal: Incision c/d/I   on R hip. Large ecchymosis in the region.    Neurological: Alert> Unable to fully assess as patient is not participatory in exam.    Data Reviewed:     Latest Ref Rng & Units 02/29/2024    6:27 AM 02/28/2024    6:08 AM 02/27/2024    4:31 AM  BMP  Glucose 70 - 99 mg/dL 96  874  870   BUN 8 - 23 mg/dL 25  19  20    Creatinine 0.44 - 1.00 mg/dL 9.01  9.07  8.99   Sodium 135 - 145 mmol/L 143  139  139   Potassium 3.5 - 5.1 mmol/L 3.7  4.0  4.0   Chloride 98 - 111 mmol/L 105  105  102   CO2 22 - 32 mmol/L 25  28  26    Calcium  8.9 - 10.3 mg/dL 8.8  8.8  9.0       Latest Ref Rng & Units 02/29/2024    6:27 AM 02/28/2024    6:08 AM 02/27/2024    4:31 AM  CBC  WBC 4.0 - 10.5 K/uL 8.4  8.7  8.9   Hemoglobin 12.0 - 15.0 g/dL 8.0  9.0  9.2   Hematocrit 36.0 - 46.0 % 25.6  28.3  28.9   Platelets 150 - 400 K/uL 178  173  194      Family Communication: Daughters at bedside.   Disposition: Status is: Inpatient Remains inpatient appropriate because: hip fx repair   Planned Discharge Destination: H/H at facility v SNF     Time spent: 35 minutes  Author: Alban Pepper, MD 02/29/2024 6:07 PM  For on call review [email or web address].

## 2024-03-01 ENCOUNTER — Encounter: Payer: Self-pay | Admitting: Orthopedic Surgery

## 2024-03-01 DIAGNOSIS — S72002A Fracture of unspecified part of neck of left femur, initial encounter for closed fracture: Secondary | ICD-10-CM | POA: Diagnosis not present

## 2024-03-01 LAB — CBC
HCT: 26.4 % — ABNORMAL LOW (ref 36.0–46.0)
Hemoglobin: 8 g/dL — ABNORMAL LOW (ref 12.0–15.0)
MCH: 29.5 pg (ref 26.0–34.0)
MCHC: 30.3 g/dL (ref 30.0–36.0)
MCV: 97.4 fL (ref 80.0–100.0)
Platelets: 169 K/uL (ref 150–400)
RBC: 2.71 MIL/uL — ABNORMAL LOW (ref 3.87–5.11)
RDW: 17.5 % — ABNORMAL HIGH (ref 11.5–15.5)
WBC: 7.9 K/uL (ref 4.0–10.5)
nRBC: 0 % (ref 0.0–0.2)

## 2024-03-01 LAB — BASIC METABOLIC PANEL WITH GFR
Anion gap: 8 (ref 5–15)
BUN: 19 mg/dL (ref 8–23)
CO2: 28 mmol/L (ref 22–32)
Calcium: 8.9 mg/dL (ref 8.9–10.3)
Chloride: 106 mmol/L (ref 98–111)
Creatinine, Ser: 0.86 mg/dL (ref 0.44–1.00)
GFR, Estimated: >60 mL/min (ref >60)
Glucose, Bld: 105 mg/dL — ABNORMAL HIGH (ref 70–99)
Potassium: 3.4 mmol/L — ABNORMAL LOW (ref 3.5–5.1)
Sodium: 142 mmol/L (ref 135–145)

## 2024-03-01 MED ORDER — POTASSIUM CHLORIDE CRYS ER 20 MEQ PO TBCR
20.0000 meq | EXTENDED_RELEASE_TABLET | Freq: Once | ORAL | Status: AC
  Administered 2024-03-01: 20 meq via ORAL
  Filled 2024-03-01: qty 1

## 2024-03-01 NOTE — Progress Notes (Signed)
 Subjective: 3 Days Post-Op Procedure(s) (LRB): FIXATION, FEMUR, NECK, PERCUTANEOUS, USING SCREW (Right) Patient is asleep upon provider arrival with sitter present in room. States had a long night and did not sleep.  Agitated. Patient appears well, and has had no acute complaints or problems History of dementia. We will continue therapy today.  Plan is to go rehab after hospital stay.  Objective: Vital signs in last 24 hours: Temp:  [98 F (36.7 C)] 98 F (36.7 C) (09/07 2056) Pulse Rate:  [71] 71 (09/07 2056) Resp:  [16] 16 (09/07 2056) BP: (156)/(96) 156/96 (09/07 2056) SpO2:  [94 %] 94 % (09/07 2056)  Intake/Output from previous day:  Intake/Output Summary (Last 24 hours) at 03/01/2024 0704 Last data filed at 03/01/2024 0626 Gross per 24 hour  Intake 265 ml  Output 1900 ml  Net -1635 ml    Intake/Output this shift: No intake/output data recorded.  Labs: Recent Labs    02/28/24 0608 02/29/24 0627  HGB 9.0* 8.0*   Recent Labs    02/28/24 0608 02/29/24 0627  WBC 8.7 8.4  RBC 3.00* 2.67*  HCT 28.3* 25.6*  PLT 173 178   Recent Labs    02/28/24 0608 02/29/24 0627  NA 139 143  K 4.0 3.7  CL 105 105  CO2 28 25  BUN 19 25*  CREATININE 0.92 0.98  GLUCOSE 125* 96  CALCIUM  8.8* 8.8*   No results for input(s): LABPT, INR in the last 72 hours.   EXAM General - Patient is Alert, Appropriate, and Oriented Extremity - Neurologically intact Neurovascular intact Sensation intact distally Intact pulses distally Dorsiflexion/Plantar flexion intact No cellulitis present Compartment soft Dressing - Dressing clean, scant drainage Motor Function - intact, moving foot and toes well on exam.  No physical therapy.  Past Medical History:  Diagnosis Date   Basal cell carcinoma    Dementia (HCC)    Hyperlipidemia    Hypertension    Microscopic hematuria 06/24/2008   negative workuo   Pulmonary nodule     Assessment/Plan: 3 Days Post-Op Procedure(s)  (LRB): FIXATION, FEMUR, NECK, PERCUTANEOUS, USING SCREW (Right) Principal Problem:   Left displaced femoral neck fracture (HCC) Active Problems:   Hyperlipidemia   Hypertension   Memory impairment   Anxiety and depression  Estimated body mass index is 23.94 kg/m as calculated from the following:   Height as of 12/18/23: 5' 3 (1.6 m).   Weight as of this encounter: 61.3 kg. Advance diet Up with therapy  Patient has dementia at baseline, sitter present during visit  Patient will continue to work with physical therapy. Weight-Bearing as tolerated to right leg  VSS and labs show hgb at 8.0 consistent with postoperative blood loss. Continue to trend  DVT prophylaxis: Lovenox  and SCDs, will continue lovenox  for 2 weeks with discharge Patient will follow-up with [facility] orthopedics in 2 weeks for re-imaging and reevaluation   Krystal Doyne, PA-C [facility] 03/01/2024, 7:04 AM

## 2024-03-01 NOTE — Care Management Important Message (Signed)
 Important Message  Patient Details  Name: Pam Keller MRN: [id number] Date of Birth: 05/23/1941   Important Message Given:  Yes - Medicare IM     Jullie Arps W, CMA 03/01/2024, 12:05 PM

## 2024-03-01 NOTE — Progress Notes (Signed)
 Physical Therapy Treatment Patient Details Name: Pam Keller MRN: [id number] DOB: 1940-11-10 Today's Date: 03/01/2024   History of Present Illness 83 y.o. female with a past medical history of severe dementia, hyperlipidemia, and hypertension who presented to the emergency department after an unwitnessed fall at her memory care unit.  Found to have R hip fx and is s/p ORIF pinning.  Pt had a fall with R shoulder fx in June.    PT Comments  Pt in bed.  Anxious to get up and initiating transition to sitting.  She does communicate that she needs to use the bathroom and BSC is obtained.  She stands and transfers with mod a x to [facility] with +1 for safety and to assist as needed.  She has large void on BSC.  Stands for care and is able to transition to recliner at bedside with mod a x 1 after care.  Stands again to reposition pad in chair.  While standing assist level significantly decreases to min a x 1 and she is able to bear weight and maintain balance with less assist.  She does stand for several moments before sitting back in chair.  Pt with severe dementia which makes communication difficult and pt very impulsive.  She needs +2 assist for mobility more for pt and staff safety.  Assist level fluctuates greatly even during session based on task and pt understanding/motivation.  Remains high fall risk.  Pt crying frequently during session.  Pain meds 4 hours prior and RN in room to give more if needed.   If plan is discharge home, recommend the following: Assistance with feeding;Assist for transportation;Supervision due to cognitive status;Two people to help with bathing/dressing/bathroom;Two people to help with walking and/or transfers   Can travel by private vehicle        Equipment Recommendations       Recommendations for Other Services       Precautions / Restrictions Precautions Precautions: Fall Recall of Precautions/Restrictions: Impaired Restrictions RLE Weight Bearing Per Provider  Order: Weight bearing as tolerated     Mobility  Bed Mobility Overal bed mobility: Needs Assistance Bed Mobility: Supine to Sit     Supine to sit: Min assist     General bed mobility comments: pt motivated to get to [facility] to void so self initiates bed mobility Patient Response: Restless, Impulsive  Transfers Overall transfer level: Needs assistance Equipment used: None Transfers: Sit to/from Stand, Bed to chair/wheelchair/BSC Sit to Stand: Min assist, Mod assist Stand pivot transfers: Mod assist              Ambulation/Gait                   Stairs             Wheelchair Mobility     Tilt Bed Tilt Bed Patient Response: Restless, Impulsive  Modified Rankin (Stroke Patients Only)       Balance Overall balance assessment: Needs assistance Sitting-balance support: Feet supported Sitting balance-Leahy Scale: Good     Standing balance support: Bilateral upper extremity supported Standing balance-Leahy Scale: Poor Standing balance comment: assist varries during session from max to min assist for balance depending on task                            Communication Communication Communication: Impaired Factors Affecting Communication: Difficulty expressing self  Cognition Arousal: Alert Behavior During Therapy: Agitated, Restless   PT - Cognitive  impairments: History of cognitive impairments                       PT - Cognition Comments: advanced dementia makes meaningful cuing/conversation/etc very limited Following commands: Impaired Following commands impaired: Follows one step commands inconsistently    Cueing Cueing Techniques: Verbal cues, Tactile cues, Gestural cues, Visual cues  Exercises      General Comments        Pertinent Vitals/Pain Pain Assessment Pain Assessment: Faces Faces Pain Scale: Hurts even more Pain Location: hip presumed Pain Descriptors / Indicators: Crying, Grimacing, Guarding Pain  Intervention(s): Limited activity within patient's tolerance, Monitored during session, Repositioned, RN gave pain meds during session, Premedicated before session    Home Living                          Prior Function            PT Goals (current goals can now be found in the care plan section) Progress towards PT goals: Progressing toward goals    Frequency    7X/week      PT Plan      Co-evaluation              AM-PAC PT 6 Clicks Mobility   Outcome Measure  Help needed turning from your back to your side while in a flat bed without using bedrails?: A Lot Help needed moving from lying on your back to sitting on the side of a flat bed without using bedrails?: A Lot Help needed moving to and from a bed to a chair (including a wheelchair)?: A Lot Help needed standing up from a chair using your arms (e.g., wheelchair or bedside chair)?: A Lot Help needed to walk in hospital room?: Total Help needed climbing 3-5 steps with a railing? : Total 6 Click Score: 10    End of Session Equipment Utilized During Treatment: Gait belt Activity Tolerance: Patient tolerated treatment well;Treatment limited secondary to agitation Patient left: in chair;with chair alarm set;with call bell/phone within reach;with family/visitor present;with nursing/sitter in room Nurse Communication: Mobility status PT Visit Diagnosis: Muscle weakness (generalized) (M62.81);Difficulty in walking, not elsewhere classified (R26.2);Unsteadiness on feet (R26.81)     Time: 1043-1101 PT Time Calculation (min) (ACUTE ONLY): 18 min  Charges:    $Therapeutic Activity: 8-22 mins PT General Charges $$ ACUTE PT VISIT: 1 Visit                   Lauraine Gills, PTA 03/01/24, 11:41 AM

## 2024-03-01 NOTE — Progress Notes (Addendum)
 Progress Note   Patient: Pam Keller FMW:[id number] DOB: 21-Feb-1941 DOA: 02/26/2024     4 DOS: the patient was seen and examined on 03/01/2024   Brief hospital course: HPI on admission: Pam Keller is a 83 y.o. female with medical history significant of dementia, hyperlipidemia, basal cell carcinoma, essential hypertension, history of pulmonary nodule, who was brought in from [facility] skilled facility after sustaining a mechanical fall.  Patient resides in the memory care unit over there.  She has right hip pain.  She normally ambulates but suspected to have the unwitnessed fall.  No one knows how long she was down.  Patient is accompanied by the daughter here.  She has had previous fall with right humeral fracture from June of this year which is healing.  She is apparently a DNI DNR at some point.  Not on any blood thinners.  Family want patient to proceed with surgery because she has been able to walk.  Dr. Tobie orthopedics consulted and will do surgery tomorrow...   Patient was admitted to medicine service and orthopedic surgery took patient to the OR on 02/27/2024 for percutaneous pinning of the right femoral neck fracture.  Post-operative PT/OT are following and recommending SNF/rehab.  Family are hopeful pt can return to [facility] with home health for rehab to keep patient in her familiar environment.    Assessment and Plan:  Right subcapsular nondisplaced femoral neck fracture:  S/p repair 03/18/2024 with Dr. Tobie --Pain control --PT/OT - SNF recommended --Family hope for [facility]/OT at [facility] --TOC following for d/c needs   Iron  deficiency anemia  Post-operative anemia Hemoglobin trended down post-operatively, but as expected.  No active bleeding. Given IV iron  infusion on 9/6 and 9/7 --Continue oral iron  supplement --Monitor CBC  Hypokalemia - mild, K 3.4 --Oral K-Cl to replace --Monitor BMP  Advanced dementia  Some agitation reported  overnight. --Recruitment consultant as needed --Continue home trazodone , Aricept  --Patient family can discuss with PCP about removing some of her medications.   HTN - BP's controlled --Continue home losartan  and hydralazine   HLD  --Continue Lipitor        Subjective: Pt seen with daughter at bedside this AM, sitter also present.  Pt denies pain or any complaints.  Currently pt is pleasantly confused, but had some agitation again overnight.    Physical Exam: Vitals:   02/29/24 0236 02/29/24 2056 03/01/24 0700 03/01/24 0741  BP: (!) 169/69 (!) 156/96  (!) 188/81  Pulse: 65 71 68 66  Resp: 16 16 16 16   Temp: 98 F (36.7 C) 98 F (36.7 C) 97.9 F (36.6 C) 98.1 F (36.7 C)  TempSrc: Oral Axillary Axillary   SpO2: 93% 94% 100% 100%  Weight:       General exam: awake, alert, no acute distress HEENT: poor dentition, moist mucus membranes, hearing grossly normal  Respiratory system: CTAB, no wheezes, rales or rhonchi, normal respiratory effort. Cardiovascular system: normal S1/S2, RRR, no pedal edema.   Gastrointestinal system: soft, NT, ND Central nervous system: limited exam due to advanced dementia. no gross focal neurologic deficits, normal speech Skin: dry, intact, normal temperature Psychiatry: normal mood, congruent affect, abnormal judgement and insight due to dementia   Data Reviewed:  Notable labs --  K 3.4 Glucose 105 Hbg 10.5 >> 9.2 >> 9.0 >> 8.0 >> 8.0  Iron  low 19 Sat ratio low 7% Normal ferritin and TIBC   Family Communication: daughter at bedside on rounds  Disposition: Status is: Inpatient  Remains inpatient appropriate because: monitoring anemia, awaiting confirmation pt able to return to ALF with [facility] PT/OT instead of needing SNF for rehab    Planned Discharge Destination: H at ALF     Time spent: 45 minutes  Author: Burnard DELENA Cunning, DO 03/01/2024 1:36 PM  For on call review [email or web address].

## 2024-03-02 DIAGNOSIS — S72002A Fracture of unspecified part of neck of left femur, initial encounter for closed fracture: Secondary | ICD-10-CM | POA: Diagnosis not present

## 2024-03-02 LAB — CBC
HCT: 24.5 % — ABNORMAL LOW (ref 36.0–46.0)
Hemoglobin: 7.8 g/dL — ABNORMAL LOW (ref 12.0–15.0)
MCH: 30.4 pg (ref 26.0–34.0)
MCHC: 31.8 g/dL (ref 30.0–36.0)
MCV: 95.3 fL (ref 80.0–100.0)
Platelets: 174 K/uL (ref 150–400)
RBC: 2.57 MIL/uL — ABNORMAL LOW (ref 3.87–5.11)
RDW: 17.6 % — ABNORMAL HIGH (ref 11.5–15.5)
WBC: 6.6 K/uL (ref 4.0–10.5)
nRBC: 0 % (ref 0.0–0.2)

## 2024-03-02 LAB — BASIC METABOLIC PANEL WITH GFR
Anion gap: 9 (ref 5–15)
BUN: 21 mg/dL (ref 8–23)
CO2: 25 mmol/L (ref 22–32)
Calcium: 8.6 mg/dL — ABNORMAL LOW (ref 8.9–10.3)
Chloride: 108 mmol/L (ref 98–111)
Creatinine, Ser: 0.84 mg/dL (ref 0.44–1.00)
GFR, Estimated: >60 mL/min (ref >60)
Glucose, Bld: 107 mg/dL — ABNORMAL HIGH (ref 70–99)
Potassium: 3.3 mmol/L — ABNORMAL LOW (ref 3.5–5.1)
Sodium: 142 mmol/L (ref 135–145)

## 2024-03-02 LAB — MAGNESIUM: Magnesium: 2.2 mg/dL (ref 1.7–2.4)

## 2024-03-02 MED ORDER — LABETALOL HCL 5 MG/ML IV SOLN: 20.0000 mg | INTRAVENOUS | Status: DC | PRN

## 2024-03-02 MED ORDER — POTASSIUM CHLORIDE CRYS ER 20 MEQ PO TBCR
40.0000 meq | EXTENDED_RELEASE_TABLET | Freq: Once | ORAL | Status: AC
  Administered 2024-03-02: 40 meq via ORAL
  Filled 2024-03-02: qty 2

## 2024-03-02 MED ORDER — HYDRALAZINE HCL 20 MG/ML IJ SOLN
10.0000 mg | Freq: Four times a day (QID) | INTRAMUSCULAR | Status: DC | PRN
  Administered 2024-03-02: 10 mg via INTRAVENOUS
  Filled 2024-03-02: qty 1

## 2024-03-02 MED ORDER — HALOPERIDOL LACTATE 5 MG/ML IJ SOLN
2.0000 mg | Freq: Once | INTRAMUSCULAR | Status: AC
  Administered 2024-03-02: 2 mg via INTRAVENOUS
  Filled 2024-03-02: qty 1

## 2024-03-02 MED ORDER — HYDRALAZINE HCL 50 MG PO TABS
50.0000 mg | ORAL_TABLET | Freq: Two times a day (BID) | ORAL | Status: DC
  Administered 2024-03-03 (×2): 50 mg via ORAL
  Filled 2024-03-02 (×3): qty 1

## 2024-03-02 NOTE — Plan of Care (Signed)
   Problem: Education: Goal: Knowledge of General Education information will improve Description Including pain rating scale, medication(s)/side effects and non-pharmacologic comfort measures Outcome: Progressing

## 2024-03-02 NOTE — Progress Notes (Signed)
 Physical Therapy Treatment Patient Details Name: Pam Keller MRN: [id number] DOB: 1940/09/06 Today's Date: 03/02/2024   History of Present Illness 83 y.o. female with a past medical history of severe dementia, hyperlipidemia, and hypertension who presented to the emergency department after an unwitnessed fall at her memory care unit.  Found to have R hip fx and is s/p ORIF pinning.  Pt had a fall with R shoulder fx in June.    PT Comments  Pt in bed.  Attempted to get pt to [facility] but pt only stood EOB before self initiating return to supine.  She is able to get to/from supine with min a x 1.  Sitting balance with supervision.  Will return later for gait attempts.   If plan is discharge home, recommend the following: Assistance with feeding;Assist for transportation;Supervision due to cognitive status;Two people to help with bathing/dressing/bathroom;Two people to help with walking and/or transfers   Can travel by private vehicle        Equipment Recommendations  BSC/3in1;Rolling walker (2 wheels)    Recommendations for Other Services       Precautions / Restrictions Precautions Precautions: Fall Recall of Precautions/Restrictions: Impaired Restrictions RLE Weight Bearing Per Provider Order: Weight bearing as tolerated     Mobility  Bed Mobility Overal bed mobility: Needs Assistance Bed Mobility: Supine to Sit, Sit to Supine       Sit to supine: Min assist        Transfers Overall transfer level: Needs assistance Equipment used: None Transfers: Sit to/from Stand Sit to Stand: Mod assist                Ambulation/Gait                   Stairs             Wheelchair Mobility     Tilt Bed    Modified Rankin (Stroke Patients Only)       Balance Overall balance assessment: Needs assistance Sitting-balance support: Feet supported Sitting balance-Leahy Scale: Good                                      Communication  Communication Communication: Impaired Factors Affecting Communication: Difficulty expressing self  Cognition Arousal: Alert Behavior During Therapy: WFL for tasks assessed/performed   PT - Cognitive impairments: History of cognitive impairments                       PT - Cognition Comments: much calmer today Following commands: Impaired Following commands impaired: Follows one step commands inconsistently    Cueing Cueing Techniques: Verbal cues, Tactile cues, Gestural cues, Visual cues  Exercises      General Comments        Pertinent Vitals/Pain Pain Assessment Pain Assessment: Faces Faces Pain Scale: Hurts a little bit Pain Location: hip presumed but does seem generally comfortable. Pain Descriptors / Indicators: Guarding    Home Living                          Prior Function            PT Goals (current goals can now be found in the care plan section) Progress towards PT goals: Progressing toward goals    Frequency    7X/week      PT Plan  Co-evaluation              AM-PAC PT 6 Clicks Mobility   Outcome Measure  Help needed turning from your back to your side while in a flat bed without using bedrails?: A Lot Help needed moving from lying on your back to sitting on the side of a flat bed without using bedrails?: A Lot Help needed moving to and from a bed to a chair (including a wheelchair)?: A Lot Help needed standing up from a chair using your arms (e.g., wheelchair or bedside chair)?: A Lot Help needed to walk in hospital room?: Total Help needed climbing 3-5 steps with a railing? : Total 6 Click Score: 10    End of Session Equipment Utilized During Treatment: Gait belt Activity Tolerance: Patient tolerated treatment well;Other (comment) Patient left: with call bell/phone within reach;with family/visitor present;in bed;with bed alarm set Nurse Communication: Mobility status PT Visit Diagnosis: Muscle weakness  (generalized) (M62.81);Difficulty in walking, not elsewhere classified (R26.2);Unsteadiness on feet (R26.81)     Time: 9096-9074 PT Time Calculation (min) (ACUTE ONLY): 22 min  Charges:    $Therapeutic Activity: 8-22 mins PT General Charges $$ ACUTE PT VISIT: 1 Visit                   Lauraine Gills, PTA 03/02/24, 10:54 AM

## 2024-03-02 NOTE — Progress Notes (Signed)
 Occupational Therapy Treatment Patient Details Name: Pam Keller MRN: [id number] DOB: 04/23/1941 Today's Date: 03/02/2024   History of present illness 83 y.o. female with a past medical history of severe dementia, hyperlipidemia, and hypertension who presented to the emergency department after an unwitnessed fall at her memory care unit.  Found to have R hip fx and is s/p ORIF pinning.  Pt had a fall with R shoulder fx in June.   OT comments  Pt awakens to voice, sitter and daughters present. Pt is pleasantly confused, limited ability to follow directions or comprehend situation this date due to baseline severe dementia. Pt exhibits signs / symptoms of R hip pain while rolling in bed for linen change, RN notified and aware of need for pain meds. Pt is able to perform bed mobility with MAX A +2, stand for brief period with face-to-face assist, limited by posterior lean and distraction. Unable to safely take steps or transfer to [facility] this date, pt spontaneously returns to seated and initiates shifting back to long sitting. Assistance level is anticipated to continue to vary based on cognition. Discharge recommendation appropriate, OT will continue to follow.       If plan is discharge home, recommend the following:  A lot of help with bathing/dressing/bathroom;Assistance with feeding;Assist for transportation;Direct supervision/assist for medications management;Help with stairs or ramp for entrance;Assistance with cooking/housework;Direct supervision/assist for financial management;Supervision due to cognitive status;Two people to help with walking and/or transfers   Equipment Recommendations  None recommended by OT       Precautions / Restrictions Precautions Precautions: Fall Recall of Precautions/Restrictions: Impaired Precaution/Restrictions Comments: secure chat to PA re: R shoulder fx Restrictions Weight Bearing Restrictions Per Provider Order: Yes RLE Weight Bearing Per Provider  Order: Weight bearing as tolerated       Mobility Bed Mobility Overal bed mobility: Needs Assistance Bed Mobility: Supine to Sit, Sit to Supine, Rolling Rolling: Max assist, +2 for physical assistance   Supine to sit: +2 for physical assistance, Max assist Sit to supine: +2 for physical assistance, Max assist   General bed mobility comments: pt not following directions, externally distracted, laughing, ultimately MAX A with sitter assisting for +2 to use pad to pivot hips towards EOB    Transfers Overall transfer level: Needs assistance Equipment used: 1 person hand held assist Transfers: Sit to/from Stand Sit to Stand: Mod assist           General transfer comment: pt continues to self iniate return to bed, pt stood for  brief period with heavy resistance/posterior push, playing with hospital gown, unable to safely attend to task or take steps     Balance Overall balance assessment: Needs assistance Sitting-balance support: Feet supported Sitting balance-Leahy Scale: Poor Sitting balance - Comments: posterior push, resistive as pt is externally distracted by own thoughts and stories, up to MAX A for sitting balance Postural control: Posterior lean   Standing balance-Leahy Scale: Poor Standing balance comment: level of assist varies depending on fluctuating cognition. pt stands briefly with heavy posterior push                           ADL either performed or assessed with clinical judgement   ADL Overall ADL's : Needs assistance/impaired  General ADL Comments: Session focused on functional mobility with attempt to transfer to [facility]     Communication Communication Communication: Impaired Factors Affecting Communication: Difficulty expressing self   Cognition Arousal: Alert Behavior During Therapy: WFL for tasks assessed/performed Cognition: History of cognitive impairments             OT -  Cognition Comments: Oriented to self only, unable to follow any directions. Pt very sweet and laughs often! Supportive daughters endorse cognition at baseline                 Following commands: Impaired Following commands impaired: Follows one step commands inconsistently      Cueing   Cueing Techniques: Verbal cues, Tactile cues, Gestural cues, Visual cues  Exercises Other Exercises Other Exercises: Edu on importance of mobility to prevent stiffness, medical complications, etc       General Comments Charge RN alerted to request for pain meds due to s/s with rolling in bed. Daughters present throughout session. Honeycomb dressing with strikethrough, brusing to R hip.    Pertinent Vitals/ Pain       Pain Assessment Pain Assessment: Faces Faces Pain Scale: Hurts little more Pain Location: R hip with rolling in bed for linen chane Pain Descriptors / Indicators: Guarding, Moaning, Operative site guarding, Grimacing Pain Intervention(s): Limited activity within patient's tolerance, Monitored during session, Patient requesting pain meds-RN notified   Frequency  Min 1X/week        Progress Toward Goals  OT Goals(current goals can now be found in the care plan section)  Progress towards OT goals: Progressing toward goals (gradual progress 2/2 cognition)  Acute Rehab OT Goals OT Goal Formulation: With patient/family Time For Goal Achievement: 03/13/24 Potential to Achieve Goals: Good ADL Goals Pt Will Perform Eating: sitting;with set-up Pt Will Perform Grooming: with supervision;sitting Pt Will Transfer to Toilet: with contact guard assist  Plan         AM-PAC OT 6 Clicks Daily Activity     Outcome Measure   Help from another person eating meals?: A Little Help from another person taking care of personal grooming?: A Lot Help from another person toileting, which includes using toliet, bedpan, or urinal?: A Lot Help from another person bathing (including washing,  rinsing, drying)?: A Lot Help from another person to put on and taking off regular upper body clothing?: A Lot Help from another person to put on and taking off regular lower body clothing?: A Lot 6 Click Score: 13    End of Session Equipment Utilized During Treatment: Gait belt  OT Visit Diagnosis: Unsteadiness on feet (R26.81);Other abnormalities of gait and mobility (R26.89);Muscle weakness (generalized) (M62.81);Repeated falls (R29.6);Cognitive communication deficit (R41.841) Symptoms and signs involving cognitive functions: Other cerebrovascular disease   Activity Tolerance Other (comment) (limited by cognition)   Patient Left in bed;with call bell/phone within reach;with bed alarm set;with family/visitor present;with nursing/sitter in room   Nurse Communication Mobility status;Patient requests pain meds        Time: 1201-1224 OT Time Calculation (min): 23 min  Charges: OT General Charges $OT Visit: 1 Visit OT Treatments $Therapeutic Activity: 23-37 mins Nyomie Ehrlich L. Bedie Dominey, OTR/L  03/02/24, 1:05 PM

## 2024-03-02 NOTE — Progress Notes (Signed)
 Progress Note   Patient: Pam Keller FMW:[id number] DOB: [birth year] DOA: 02/26/2024     5 DOS: the patient was seen and examined on 03/02/2024   Brief hospital course: HPI on admission: Pam Keller is a 83 y.o. female with medical history significant of dementia, hyperlipidemia, basal cell carcinoma, essential hypertension, history of pulmonary nodule, who was brought in from [facility] skilled facility after sustaining a mechanical fall.  Patient resides in the memory care unit over there.  She has right hip pain.  She normally ambulates but suspected to have the unwitnessed fall.  No one knows how long she was down.  Patient is accompanied by the daughter here.  She has had previous fall with right humeral fracture from June of this year which is healing.  She is apparently a DNI DNR at some point.  Not on any blood thinners.  Family want patient to proceed with surgery because she has been able to walk.  Dr. Tobie orthopedics consulted and will do surgery tomorrow...   Patient was admitted to medicine service and orthopedic surgery took patient to the OR on 02/27/2024 for percutaneous pinning of the right femoral neck fracture.  Post-operative PT/OT are following and recommending SNF/rehab.  Family were initially hopeful to return to [facility] with home health for rehab to keep patient in her familiar environment.  However, they have decided to pursue rehab in hopes pt will recover her mobility to prior baseline.  They plan for her usual paid sitters to be with her at rehab.    Assessment and Plan:  Right subcapsular nondisplaced femoral neck fracture:  S/p repair 03/18/2024 with Dr. Tobie --Pain control --PT/OT - SNF recommended & family now agreeable --TOC following for d/c needs   Iron  deficiency anemia  Post-operative anemia Hemoglobin trended down post-operatively, but as expected.  No active bleeding. Given IV iron  infusion on 9/6 and 9/7 --Continue oral iron   supplement --Monitor CBC  Hypokalemia - mild, K 3.3 today --Oral K-Cl to replace --Monitor BMP  Advanced dementia  Some agitation reported overnight. --Recruitment consultant as needed --Continue home trazodone , Aricept  --Patient family can discuss with PCP about removing some of her medications --Delirium precautions   HTN - BP's elevated, possibly due to pain but pt not showing objective pain signs at rest --Continue home losartan  and hydralazine  --Increased hydralazine  25 >> 50 mg  HLD  --Continue Lipitor        Subjective: Pt seen with two daughters at bedside this AM, sitter also present.  Pt awake, in good spirits.  She denies pain.  Family have been talking about rehab vs return to [facility] and now feel that rehab would serve pt better, they hope she can recover her mobility more quickly at rehab as pt loves to spend her time walking. No acute events reported overnight.    Physical Exam: Vitals:   03/01/24 1800 03/01/24 1955 03/02/24 0600 03/02/24 0733  BP: (!) 160/82 (!) 149/86 (!) 189/85 (!) 186/85  Pulse: 87 78 61 (!) 55  Resp: 18 16 18 16   Temp: 98.2 F (36.8 C) 98.2 F (36.8 C) 98.1 F (36.7 C) 97.6 F (36.4 C)  TempSrc:  Oral Oral Oral  SpO2: 92% 96% 96% 98%  Weight:       General exam: awake, alert, no acute distress HEENT: poor dentition, moist mucus membranes, hearing grossly normal  Respiratory system: CTAB, no wheezes, rales or rhonchi, normal respiratory effort. Cardiovascular system: normal S1/S2, RRR, no pedal edema.  Gastrointestinal system: soft, NT, ND Central nervous system: no facial droop with smile, equal symmetric 5/5 grip strength, follows commands, normal speech Skin: dry, intact, normal temperature Psychiatry: normal mood, congruent affect, abnormal judgement and insight due to dementia   Data Reviewed:  Notable labs --  K 3.3 Glucose 107 Ca 8.6  Hbg 10.5 >> 9.2 >> 9.0 >> 8.0 >> 8.0 >> 7.8    Iron  low 19 Sat ratio low  7% Normal ferritin and TIBC   Family Communication: 2 daughters at bedside on rounds  Disposition: Status is: Inpatient Remains inpatient appropriate because: needs SNF for rehab    Planned Discharge Destination: SNF    Time spent: 35 minutes  Author: Burnard DELENA Cunning, DO 03/02/2024 1:45 PM  For on call review [email or web address].

## 2024-03-02 NOTE — Progress Notes (Signed)
 Physical Therapy Treatment Patient Details Name: Pam Keller MRN: [id number] DOB: 1940/09/11 Today's Date: 03/02/2024   History of Present Illness 83 y.o. female with a past medical history of severe dementia, hyperlipidemia, and hypertension who presented to the emergency department after an unwitnessed fall at her memory care unit.  Found to have R hip fx and is s/p ORIF pinning.  Pt had a fall with R shoulder fx in June.    PT Comments  Pt awake and session attempted again.  She is able to get to EOB with min/mod a x 1.  Steady in sitting.  Stood x 2 at EOB with varied levels of assist to stand and +2 for safety.  Attempted gait but pt continued to self initiate return to bed.    Long discussion with daughters regarding discharge plans and their concern over discharge back to [facility] vs SNF/rehab bed.  Pro's/cons of each discussed along with possible Hospice services that had been recommended in the past.  Pt was ambulatory prior to admission for hip fx and overall with good mobility with private sitters and would like pt to be able to return to that PLOF if able.   Assist level has varied since hip fx due to cognition.  Pt does demonstrate good strength and generally seems to have pain well controlled.  Pt would benefit from continued therapies at discharge to assist in return to PLOF.  Information provided about supports and discharge choices and left to discuss and reached out to [facility] to learn of bed options at SNF if they choose that route vs right back to Memory Care facility.  They do seem torn at this time on what would be best going forward.     If plan is discharge home, recommend the following: Assistance with feeding;Assist for transportation;Supervision due to cognitive status;Two people to help with bathing/dressing/bathroom;Two people to help with walking and/or transfers   Can travel by private vehicle        Equipment Recommendations  BSC/3in1;Rolling walker (2 wheels)     Recommendations for Other Services       Precautions / Restrictions Precautions Precautions: Fall Recall of Precautions/Restrictions: Impaired Restrictions RLE Weight Bearing Per Provider Order: Weight bearing as tolerated     Mobility  Bed Mobility Overal bed mobility: Needs Assistance Bed Mobility: Supine to Sit, Sit to Supine     Supine to sit: Min assist Sit to supine: Min assist        Transfers Overall transfer level: Needs assistance Equipment used: 2 person hand held assist Transfers: Sit to/from Stand Sit to Stand: Min assist, Mod assist, +2 physical assistance           General transfer comment: pt continues to self iniate return to bed.    Ambulation/Gait                   Stairs             Wheelchair Mobility     Tilt Bed    Modified Rankin (Stroke Patients Only)       Balance Overall balance assessment: Needs assistance Sitting-balance support: Feet supported Sitting balance-Leahy Scale: Good     Standing balance support: Bilateral upper extremity supported Standing balance-Leahy Scale: Poor Standing balance comment: +2 for assist as level continues to vary during session                            Communication  Communication Communication: Impaired Factors Affecting Communication: Difficulty expressing self  Cognition Arousal: Alert Behavior During Therapy: WFL for tasks assessed/performed   PT - Cognitive impairments: History of cognitive impairments                       PT - Cognition Comments: much calmer today Following commands: Impaired Following commands impaired: Follows one step commands inconsistently    Cueing Cueing Techniques: Verbal cues, Tactile cues, Gestural cues, Visual cues  Exercises Other Exercises Other Exercises: Educ w/ daughters re: plan of care, DC recs    General Comments        Pertinent Vitals/Pain Pain Assessment Pain Assessment: Faces Faces Pain  Scale: Hurts a little bit Pain Location: hip presumed but does seem generally comfortable. Pain Descriptors / Indicators: Guarding Pain Intervention(s): Limited activity within patient's tolerance, Monitored during session, Repositioned    Home Living                          Prior Function            PT Goals (current goals can now be found in the care plan section) Progress towards PT goals: Progressing toward goals    Frequency    7X/week      PT Plan      Co-evaluation              AM-PAC PT 6 Clicks Mobility   Outcome Measure  Help needed turning from your back to your side while in a flat bed without using bedrails?: A Lot Help needed moving from lying on your back to sitting on the side of a flat bed without using bedrails?: A Lot Help needed moving to and from a bed to a chair (including a wheelchair)?: A Lot Help needed standing up from a chair using your arms (e.g., wheelchair or bedside chair)?: A Lot Help needed to walk in hospital room?: A Lot Help needed climbing 3-5 steps with a railing? : Total 6 Click Score: 11    End of Session Equipment Utilized During Treatment: Gait belt Activity Tolerance: Patient tolerated treatment well;Other (comment) Patient left: with call bell/phone within reach;with family/visitor present;in bed;with bed alarm set Nurse Communication: Mobility status PT Visit Diagnosis: Muscle weakness (generalized) (M62.81);Difficulty in walking, not elsewhere classified (R26.2);Unsteadiness on feet (R26.81)     Time: 9041-8959 PT Time Calculation (min) (ACUTE ONLY): 42 min  Charges:    $Therapeutic Activity: 38-52 mins PT General Charges $$ ACUTE PT VISIT: 1 Visit         Lauraine Gills, PTA 03/02/24, 11:04 AM

## 2024-03-02 NOTE — TOC Progression Note (Signed)
 Transition of Care [facility]) - Progression Note    Patient Details  Name: Pam Keller MRN: [id number] Date of Birth: 1940-08-30  Transition of Care [facility]) CM/SW Contact  Alvaro Louder, [place] Phone Number: 03/02/2024, 9:46 AM  Clinical Narrative:  ISRAEL spoke [facility] [place] and she indicated that this patient can return back to [facility] when she is medically stable. LCSWA to follow up with MD to check for medical readiness.   TOC to follow for discharge.     Expected Discharge Plan: Skilled Nursing Facility Barriers to Discharge: Continued Medical Work up               Expected Discharge Plan and Services       Living arrangements for the past 2 months: Assisted Living Facility                                       Social Drivers of Health (SDOH) Interventions SDOH Screenings   Food Insecurity: No Food Insecurity (02/27/2024)  Housing: Low Risk  (02/27/2024)  Transportation Needs: No Transportation Needs (02/27/2024)  Utilities: Not At Risk (02/27/2024)  Financial Resource Strain: Low Risk  (01/09/2024)   Received from [facility]  Social Connections: Socially Isolated (02/27/2024)  Tobacco Use: Medium Risk (02/27/2024)    Readmission Risk Interventions     No data to display

## 2024-03-02 NOTE — Progress Notes (Signed)
 Subjective: 4 Days Post-Op Procedure(s) (LRB): FIXATION, FEMUR, NECK, PERCUTANEOUS, USING SCREW (Right) Patient is asleep upon provider arrival with sitter present in room. States had a long night and did not sleep.  Agitated. Patient appears well, and has had no acute complaints or problems History of dementia. We will continue therapy today.  Plan is to go rehab after hospital stay.  Objective: Vital signs in last 24 hours: Temp:  [97.9 F (36.6 C)-98.2 F (36.8 C)] 98.1 F (36.7 C) (09/09 0600) Pulse Rate:  [61-87] 61 (09/09 0600) Resp:  [16-18] 18 (09/09 0600) BP: (149-189)/(81-86) 189/85 (09/09 0600) SpO2:  [92 %-100 %] 96 % (09/09 0600)  Intake/Output from previous day:  Intake/Output Summary (Last 24 hours) at 03/02/2024 0643 Last data filed at 03/02/2024 0328 Gross per 24 hour  Intake 240 ml  Output 960 ml  Net -720 ml    Intake/Output this shift: Total I/O In: -  Out: 460 [Urine:460]  Labs: Recent Labs    02/29/24 0627 03/01/24 0822  HGB 8.0* 8.0*   Recent Labs    02/29/24 0627 03/01/24 0822  WBC 8.4 7.9  RBC 2.67* 2.71*  HCT 25.6* 26.4*  PLT 178 169   Recent Labs    02/29/24 0627 03/01/24 0822  NA 143 142  K 3.7 3.4*  CL 105 106  CO2 25 28  BUN 25* 19  CREATININE 0.98 0.86  GLUCOSE 96 105*  CALCIUM  8.8* 8.9   No results for input(s): LABPT, INR in the last 72 hours.   EXAM General - Patient is Alert, Appropriate, and Oriented Extremity - Neurologically intact Neurovascular intact Sensation intact distally Intact pulses distally Dorsiflexion/Plantar flexion intact No cellulitis present Compartment soft Dressing - Dressing clean, scant drainage Motor Function - intact, moving foot and toes well on exam.  No physical therapy.  Past Medical History:  Diagnosis Date   Basal cell carcinoma    Dementia (HCC)    Hyperlipidemia    Hypertension    Microscopic hematuria 06/24/2008   negative workuo   Pulmonary nodule      Assessment/Plan: 4 Days Post-Op Procedure(s) (LRB): FIXATION, FEMUR, NECK, PERCUTANEOUS, USING SCREW (Right) Principal Problem:   Left displaced femoral neck fracture (HCC) Active Problems:   Hyperlipidemia   Hypertension   Memory impairment   Anxiety and depression  Estimated body mass index is 23.94 kg/m as calculated from the following:   Height as of 12/18/23: 5' 3 (1.6 m).   Weight as of this encounter: 61.3 kg. Advance diet Up with therapy  Patient has dementia at baseline, sitter present during visit  Patient will continue to work with physical therapy. Weight-Bearing as tolerated to right leg  VSS and labs show hgb at 8.0 consistent with postoperative blood loss. Continue to trend  DVT prophylaxis: Lovenox  and SCDs, will continue lovenox  for 2 weeks with discharge Patient will follow-up with [facility]opedics in 2 weeks for re-imaging and reevaluation   Krystal Doyne, PA-C [facility] 03/02/2024, 6:43 AM

## 2024-03-03 DIAGNOSIS — I10 Essential (primary) hypertension: Secondary | ICD-10-CM

## 2024-03-03 DIAGNOSIS — S72001A Fracture of unspecified part of neck of right femur, initial encounter for closed fracture: Secondary | ICD-10-CM | POA: Diagnosis not present

## 2024-03-03 DIAGNOSIS — E876 Hypokalemia: Secondary | ICD-10-CM

## 2024-03-03 DIAGNOSIS — F02C3 Dementia in other diseases classified elsewhere, severe, with mood disturbance: Secondary | ICD-10-CM

## 2024-03-03 DIAGNOSIS — F03C0 Unspecified dementia, severe, without behavioral disturbance, psychotic disturbance, mood disturbance, and anxiety: Secondary | ICD-10-CM

## 2024-03-03 DIAGNOSIS — E785 Hyperlipidemia, unspecified: Secondary | ICD-10-CM

## 2024-03-03 DIAGNOSIS — F419 Anxiety disorder, unspecified: Secondary | ICD-10-CM

## 2024-03-03 DIAGNOSIS — F32A Depression, unspecified: Secondary | ICD-10-CM

## 2024-03-03 DIAGNOSIS — G308 Other Alzheimer's disease: Secondary | ICD-10-CM

## 2024-03-03 DIAGNOSIS — D509 Iron deficiency anemia, unspecified: Secondary | ICD-10-CM | POA: Diagnosis not present

## 2024-03-03 LAB — BASIC METABOLIC PANEL WITH GFR
Anion gap: 9 (ref 5–15)
BUN: 21 mg/dL (ref 8–23)
CO2: 23 mmol/L (ref 22–32)
Calcium: 8.9 mg/dL (ref 8.9–10.3)
Chloride: 109 mmol/L (ref 98–111)
Creatinine, Ser: 0.75 mg/dL (ref 0.44–1.00)
GFR, Estimated: >60 mL/min (ref >60)
Glucose, Bld: 90 mg/dL (ref 70–99)
Potassium: 3.2 mmol/L — ABNORMAL LOW (ref 3.5–5.1)
Sodium: 141 mmol/L (ref 135–145)

## 2024-03-03 LAB — CBC
HCT: 25.5 % — ABNORMAL LOW (ref 36.0–46.0)
Hemoglobin: 8.2 g/dL — ABNORMAL LOW (ref 12.0–15.0)
MCH: 30.4 pg (ref 26.0–34.0)
MCHC: 32.2 g/dL (ref 30.0–36.0)
MCV: 94.4 fL (ref 80.0–100.0)
Platelets: 209 K/uL (ref 150–400)
RBC: 2.7 MIL/uL — ABNORMAL LOW (ref 3.87–5.11)
RDW: 17.8 % — ABNORMAL HIGH (ref 11.5–15.5)
WBC: 9.9 K/uL (ref 4.0–10.5)
nRBC: 0 % (ref 0.0–0.2)

## 2024-03-03 MED ORDER — HYDRALAZINE HCL 50 MG PO TABS: 50.0000 mg | ORAL_TABLET | Freq: Two times a day (BID) | ORAL | 0 refills | Status: AC

## 2024-03-03 MED ORDER — POTASSIUM CHLORIDE 20 MEQ PO PACK: 20.0000 meq | PACK | Freq: Two times a day (BID) | ORAL | 0 refills | Status: AC

## 2024-03-03 MED ORDER — AMOXICILLIN-POT CLAVULANATE 500-125 MG PO TABS
1.0000 | ORAL_TABLET | Freq: Two times a day (BID) | ORAL | Status: DC
Start: 2024-03-03 — End: 2024-03-04
  Administered 2024-03-03: 1 via ORAL
  Filled 2024-03-03 (×2): qty 1

## 2024-03-03 MED ORDER — POTASSIUM CHLORIDE CRYS ER 20 MEQ PO TBCR
40.0000 meq | EXTENDED_RELEASE_TABLET | Freq: Once | ORAL | Status: AC
  Administered 2024-03-03: 40 meq via ORAL
  Filled 2024-03-03: qty 2

## 2024-03-03 MED ORDER — POLYETHYLENE GLYCOL 3350 17 G PO PACK
17.0000 g | PACK | Freq: Every day | ORAL | Status: DC | PRN
  Administered 2024-03-03: 17 g via ORAL

## 2024-03-03 MED ORDER — AMOXICILLIN-POT CLAVULANATE 500-125 MG PO TABS: 1.0000 | ORAL_TABLET | Freq: Two times a day (BID) | ORAL | 0 refills | Status: AC

## 2024-03-03 MED ORDER — LORAZEPAM 0.5 MG PO TABS: 0.5000 mg | ORAL_TABLET | Freq: Two times a day (BID) | ORAL | 0 refills | Status: AC | PRN

## 2024-03-03 MED ORDER — MUPIROCIN 2 % EX OINT: 1.0000 | TOPICAL_OINTMENT | Freq: Two times a day (BID) | CUTANEOUS | 0 refills | Status: AC

## 2024-03-03 MED ORDER — POLYETHYLENE GLYCOL 3350 17 G PO PACK: 17.0000 g | PACK | Freq: Every day | ORAL | 0 refills | Status: AC | PRN

## 2024-03-03 MED ORDER — BUSPIRONE HCL 10 MG PO TABS: 10.0000 mg | ORAL_TABLET | Freq: Two times a day (BID) | ORAL | 0 refills | Status: AC

## 2024-03-03 NOTE — Progress Notes (Signed)
 Occupational Therapy Treatment Patient Details Name: Pam Keller MRN: [id number] DOB: 08/12/1940 Today's Date: 03/03/2024   History of present illness 83 y.o. female with a past medical history of severe dementia, hyperlipidemia, and hypertension who presented to the emergency department after an unwitnessed fall at her memory care unit.  Found to have R hip fx and is s/p ORIF pinning.  Pt had a fall with R shoulder fx in June.   OT comments  Pt seen for OT treatment this date, pain meds received prior to session. Pt greatly limited by cognition, but is pleasantly confused and participatory when motivated. Improved mobility today, pt is able to transition to EOB with MAX A, increased time and max multimodal cuing with daughter encouraging pt. Pt tends to move one leg, then immediately return to bed so bed pads ultimately used to pivot hips. Attempted to facilitate engagement in ADLs by ambulating to bathroom, pt stands with HHA +1 (MIN A for steadying assist), RW provided for increased stability, and pt takes a few steps around the bed with MIN A before abandoning the RW due to external distractions and reaching outside BOS for objects of interest. Pt exhibits signs of pain on R hip, +2 HHA provided to guide pt to sit back at EOB and MAX A for repositioning. OT will continue to follow acutely. Pt will require significant physical assist and supervision at next LOC due to current functional impairments. Discharge recommendation appropriate.       If plan is discharge home, recommend the following:  A lot of help with bathing/dressing/bathroom;Assistance with feeding;Assist for transportation;Direct supervision/assist for medications management;Help with stairs or ramp for entrance;Assistance with cooking/housework;Direct supervision/assist for financial management;Supervision due to cognitive status;Two people to help with walking and/or transfers   Equipment Recommendations  None recommended by  OT       Precautions / Restrictions Precautions Precautions: Fall Recall of Precautions/Restrictions: Impaired Precaution/Restrictions Comments: per secure chat with ortho PA, okay to use RW with recent RUE fx Restrictions Weight Bearing Restrictions Per Provider Order: Yes RLE Weight Bearing Per Provider Order: Weight bearing as tolerated       Mobility Bed Mobility Overal bed mobility: Needs Assistance Bed Mobility: Supine to Sit, Sit to Supine     Supine to sit: Max assist Sit to supine: Max assist   General bed mobility comments: maxA, pt has physical ability to perform with likely CGA-MIN A but limited by cognition, pt hesitant to get OOB, often moving one leg and immediately going back to bed. bed pads ultimately used to pivot hips and sit at EOB    Transfers Overall transfer level: Needs assistance Equipment used: 2 person hand held assist, Rolling walker (2 wheels) Transfers: Sit to/from Stand Sit to Stand: Min assist           General transfer comment: daughter encourged pt to stand, pt stands with MIN A +1 HHA, guarding at RLE, RW provided for increased stability     Balance Overall balance assessment: Needs assistance Sitting-balance support: Feet supported Sitting balance-Leahy Scale: Good Sitting balance - Comments: assist level flucuates based on intrinsic motivation and cognition   Standing balance support: Bilateral upper extremity supported, No upper extremity supported, Single extremity supported, During functional activity Standing balance-Leahy Scale: Poor Standing balance comment: level of assist varies depending on fluctuating cognition. pt stands briefly with heavy posterior push. when motivated, pt is CGA for safety but up to MAX A. pt can stand without UE support, unilateral support or BUE  support                           ADL either performed or assessed with clinical judgement   ADL Overall ADL's : Needs assistance/impaired                                        General ADL Comments: Session focused on OOB mobility, attempted to motivated pt to walk to bathroom, walks in room instead but limited by cognition / poor insight     Communication Communication Communication: Impaired Factors Affecting Communication: Difficulty expressing self (tangetial, laughs, easily distracted)   Cognition Arousal: Alert Behavior During Therapy: WFL for tasks assessed/performed Cognition: History of cognitive impairments             OT - Cognition Comments: Pt pleasantly confused, does not follow commands, impulsive, poor insight/awareness. Easily distracted, daughters present and help motivated pt                 Following commands: Impaired Following commands impaired: Follows one step commands inconsistently      Cueing   Cueing Techniques: Verbal cues, Tactile cues, Gestural cues, Visual cues        General Comments R hip honeycomb dressing noted to have drainage, charge RN notified.    Pertinent Vitals/ Pain       Pain Assessment Pain Assessment: Faces Faces Pain Scale: Hurts little more Pain Location: R hip Pain Descriptors / Indicators: Guarding, Moaning, Operative site guarding, Grimacing Pain Intervention(s): Limited activity within patient's tolerance, Monitored during session, Premedicated before session, Repositioned   Frequency  Min 1X/week        Progress Toward Goals  OT Goals(current goals can now be found in the care plan section)  Progress towards OT goals: Progressing toward goals  Acute Rehab OT Goals OT Goal Formulation: With patient/family Time For Goal Achievement: 03/13/24 Potential to Achieve Goals: Good ADL Goals Pt Will Perform Eating: sitting;with set-up Pt Will Perform Grooming: with supervision;sitting Pt Will Transfer to Toilet: with contact guard assist  Plan         AM-PAC OT 6 Clicks Daily Activity     Outcome Measure   Help from  another person eating meals?: A Little Help from another person taking care of personal grooming?: A Lot Help from another person toileting, which includes using toliet, bedpan, or urinal?: A Lot Help from another person bathing (including washing, rinsing, drying)?: A Lot Help from another person to put on and taking off regular upper body clothing?: A Lot Help from another person to put on and taking off regular lower body clothing?: A Lot 6 Click Score: 13    End of Session Equipment Utilized During Treatment: Gait belt;Rolling walker (2 wheels)  OT Visit Diagnosis: Unsteadiness on feet (R26.81);Other abnormalities of gait and mobility (R26.89);Muscle weakness (generalized) (M62.81);Repeated falls (R29.6);Cognitive communication deficit (R41.841) Symptoms and signs involving cognitive functions: Other cerebrovascular disease   Activity Tolerance Other (comment) (limited by cognition)   Patient Left in bed;with call bell/phone within reach;with bed alarm set;with family/visitor present;with nursing/sitter in room   Nurse Communication Mobility status;Other (comment) (drainage in honeycomb dressing)        Time: 8866-8799 OT Time Calculation (min): 27 min  Charges: OT General Charges $OT Visit: 1 Visit OT Treatments $Self Care/Home Management : 8-22 mins $Therapeutic Activity: 8-22 mins  Guy Seese L. Debbrah Sampedro, OTR/L  03/03/24, 12:47 PM

## 2024-03-03 NOTE — Assessment & Plan Note (Signed)
 On metoprolol , hydralazine  and losartan 

## 2024-03-03 NOTE — Assessment & Plan Note (Signed)
 Patient given iron  infusions on 9/6 and 9/7.  Last hemoglobin 8.2

## 2024-03-03 NOTE — NC FL2 (Signed)
 [place]  MEDICAID [id number] LEVEL OF CARE FORM     IDENTIFICATION  Patient Name: NYOMIE EHRLICH Birthdate: 15-Dec-1940 Sex: female Admission Date (Current Location): 02/26/2024  [facility] and [place] Number:      Facility and Address:  [facility], [address] [place], [place] [address]      Provider Number: [id number]  Attending Physician Name and Address:  Josette Ade, MD  Relative Name and Phone Number:       Current Level of Care: Hospital Recommended Level of Care: Skilled Nursing Facility Prior Approval Number:    Date Approved/Denied:   PASRR Number:    Discharge Plan: SNF    Current Diagnoses: Patient Active Problem List   Diagnosis Date Noted   Left displaced femoral neck fracture (HCC) 02/26/2024   UTI (urinary tract infection) 12/18/2023   Closed fracture of right proximal humerus 12/18/2023   Memory impairment 12/17/2014   Anxiety and depression 12/17/2014   Long-term use of high-risk medication 10/06/2014   Hypokalemia 11/19/2013   Chronic insomnia 10/05/2013   Carotid bruit present 10/04/2013   Occipital headache 09/29/2012   Neck stiffness 09/08/2012   Pseudophakia 04/23/2012   Screening for colon cancer 09/29/2011   Medicare annual wellness visit, subsequent 09/27/2011   Hyperlipidemia    Hypertension    Pulmonary nodule    Basal cell carcinoma    Screening for breast cancer 04/29/2011   Screening for cervical cancer 04/29/2011   History of postmenopausal HRT 04/29/2011   Microscopic hematuria    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    Orientation RESPIRATION BLADDER Height & Weight     Self  Normal Incontinent Weight: 135 lb 2.3 oz (61.3 kg) Height:     BEHAVIORAL SYMPTOMS/MOOD NEUROLOGICAL BOWEL NUTRITION STATUS      Continent Diet (Regular)  AMBULATORY STATUS COMMUNICATION OF NEEDS Skin   Limited Assist Verbally Surgical wounds (Surgical Closed incision right thigh)                       Personal Care  Assistance Level of Assistance  Bathing, Dressing, Feeding Bathing Assistance: Limited assistance Feeding assistance: Independent Dressing Assistance: Limited assistance     Functional Limitations Info  Hearing, Sight, Speech Sight Info: Adequate Hearing Info: Adequate Speech Info: Adequate    SPECIAL CARE FACTORS FREQUENCY  PT (By licensed PT), OT (By licensed OT)     PT Frequency: 5x/week OT Frequency: 5x/week            Contractures      Additional Factors Info  Code Status, Allergies Code Status Info: Limited Allergies Info: Meperidine, Clindamycin, Clindamycin/lincomycin, Demerol, Hydrocodone, Sulfa Antibiotics, Codeine, Penicillins           Current Medications (03/03/2024):  This is the current hospital active medication list Current Facility-Administered Medications  Medication Dose Route Frequency Provider Last Rate Last Admin   0.9 %  sodium chloride  infusion   Intravenous Continuous Tobie Priest, MD 75 mL/hr at 03/03/24 0702 New Bag at 03/03/24 9297   acetaminophen  (TYLENOL ) tablet 1,000 mg  1,000 mg Oral Q8H Tobie Priest, MD   1,000 mg at 03/02/24 2256   allopurinol  (ZYLOPRIM ) tablet 100 mg  100 mg Oral Daily Tobie Priest, MD   100 mg at 03/02/24 1328   amoxicillin -clavulanate (AUGMENTIN ) 500-125 MG per tablet 1 tablet  1 tablet Oral BID Josette Ade, MD       atorvastatin  (LIPITOR) tablet 40 mg  40 mg Oral QHS Tobie Priest, MD   40 mg  at 03/02/24 2257   bisacodyl  (DULCOLAX) suppository 10 mg  10 mg Rectal Daily PRN Tobie Priest, MD       busPIRone  (BUSPAR ) tablet 10 mg  10 mg Oral BID Tobie Priest, MD   10 mg at 03/02/24 2256   cholecalciferol  (VITAMIN D3) 25 MCG (1000 UNIT) tablet 1,000 Units  1,000 Units Oral Daily Franchot Novel, MD   1,000 Units at 03/02/24 0900   donepezil  (ARICEPT ) tablet 5 mg  5 mg Oral QHS Franchot Novel, MD   5 mg at 03/02/24 2258   enoxaparin  (LOVENOX ) injection 40 mg  40 mg Subcutaneous Q24H Tobie Priest, MD   40 mg at  03/02/24 9157   ferrous sulfate  tablet 325 mg  325 mg Oral Daily Franchot Novel, MD   325 mg at 03/02/24 0859   hydrALAZINE  (APRESOLINE ) injection 10 mg  10 mg Intravenous Q6H PRN Mansy, Jan A, MD   10 mg at 03/02/24 2107   hydrALAZINE  (APRESOLINE ) tablet 50 mg  50 mg Oral BID Fausto Sor A, DO       HYDROmorphone  (DILAUDID ) injection 0.2-0.4 mg  0.2-0.4 mg Intravenous Q4H PRN Tobie Priest, MD   0.4 mg at 03/02/24 2104   labetalol  (NORMODYNE ) injection 20 mg  20 mg Intravenous Q3H PRN Mansy, Jan A, MD       losartan  (COZAAR ) tablet 100 mg  100 mg Oral Daily Franchot Novel, MD   100 mg at 03/02/24 0901   methocarbamol  (ROBAXIN ) tablet 500 mg  500 mg Oral Q6H PRN Tobie Priest, MD   500 mg at 03/02/24 1636   Or   methocarbamol  (ROBAXIN ) injection 500 mg  500 mg Intravenous Q6H PRN Tobie Priest, MD       metoCLOPramide  (REGLAN ) tablet 5-10 mg  5-10 mg Oral Q8H PRN Tobie Priest, MD       Or   metoCLOPramide  (REGLAN ) injection 5-10 mg  5-10 mg Intravenous Q8H PRN Tobie Priest, MD       metoprolol  succinate (TOPROL -XL) 24 hr tablet 12.5 mg  12.5 mg Oral Daily Tobie Priest, MD   12.5 mg at 03/02/24 0900   mupirocin  ointment (BACTROBAN ) 2 % 1 Application  1 Application Nasal BID Franchot Novel, MD   1 Application at 03/02/24 2258   ondansetron  (ZOFRAN ) tablet 4 mg  4 mg Oral Q6H PRN Tobie Priest, MD       Or   ondansetron  (ZOFRAN ) injection 4 mg  4 mg Intravenous Q6H PRN Tobie Priest, MD       oxyCODONE  (Oxy IR/ROXICODONE ) immediate release tablet 2.5-5 mg  2.5-5 mg Oral Q4H PRN Tobie Priest, MD   5 mg at 03/02/24 1634   oxyCODONE  (Oxy IR/ROXICODONE ) immediate release tablet 5-10 mg  5-10 mg Oral Q4H PRN Tobie Priest, MD   10 mg at 02/29/24 1425   polyethylene glycol (MIRALAX  / GLYCOLAX ) packet 17 g  17 g Oral Daily PRN Josette Ade, MD       potassium chloride  SA (KLOR-CON  M) CR tablet 40 mEq  40 mEq Oral Once Josette Ade, MD       senna (SENOKOT) tablet 17.2 mg  2 tablet Oral  Daily Franchot Novel, MD   17.2 mg at 03/02/24 9140   sertraline  (ZOLOFT ) tablet 100 mg  100 mg Oral QHS Franchot Novel, MD   100 mg at 03/02/24 2256   sodium phosphate  (FLEET) enema 1 enema  1 enema Rectal Once PRN Tobie Priest, MD       traZODone  (DESYREL ) tablet 25  mg  25 mg Oral QHS Franchot Novel, MD   25 mg at 03/02/24 2242     Discharge Medications: Please see discharge summary for a list of discharge medications.  Relevant Imaging Results:  Relevant Lab Results:   Additional Information SSN: [id number]  Alvaro Louder, LCSW

## 2024-03-03 NOTE — TOC Transition Note (Addendum)
 Transition of Care [facility]) - Discharge Note   Patient Details  Name: Pam Keller MRN: [id number] Date of Birth: 1941/03/01  [facility] CM/SW Contact:  Alvaro Louder, LCSW Phone Number: 03/03/2024, 3:36 PM   Clinical Narrative:   LCSWA received insurance approval for patient to admit to SNF [facility]. LCSWA confirmed with MD that patient is stable for discharge. LCSWA notified the patient and they are in agreement with discharge. LCSWA confirmed bed is available at SNF. Transport arranged with [facility] for next available.  Number to call report [phone], [phone]  TOC to follow for discharge   Final next level of care: Skilled Nursing Facility Barriers to Discharge: No Barriers Identified   Patient Goals and CMS Choice   CMS Medicare.gov Compare Post Acute Care list provided to:: Patient Represenative (must comment) Choice offered to / list presented to : Adult Children      Discharge Placement              Patient chooses bed at: [facility] [place] Patient to be transferred to facility by: [facility] Name of family member notified: Kim Patient and family notified of of transfer: 03/03/24  Discharge Plan and Services Additional resources added to the After Visit Summary for                                       Social Drivers of Health (SDOH) Interventions SDOH Screenings   Food Insecurity: No Food Insecurity (02/27/2024)  Housing: Low Risk  (02/27/2024)  Transportation Needs: No Transportation Needs (02/27/2024)  Utilities: Not At Risk (02/27/2024)  Financial Resource Strain: Low Risk  (01/09/2024)   Received from [facility]  Social Connections: Socially Isolated (02/27/2024)  Tobacco Use: Medium Risk (02/27/2024)     Readmission Risk Interventions     No data to display

## 2024-03-03 NOTE — Progress Notes (Signed)
 [employer] and spoke to [place], [place]. Pt left facility via stretcher.

## 2024-03-03 NOTE — Plan of Care (Signed)
   Problem: Coping: Goal: Level of anxiety will decrease Outcome: Progressing

## 2024-03-03 NOTE — Assessment & Plan Note (Signed)
 On Lipitor

## 2024-03-03 NOTE — Discharge Summary (Signed)
 Physician Discharge Summary   Patient: Pam Keller MRN: [id number] DOB: 1940/10/07  Admit date:     02/26/2024  Discharge date: 03/03/24  Discharge Physician: Charlie Patterson   PCP: Pia Kerney SQUIBB, MD   Recommendations at discharge:   Follow-up with team at rehab once daily Follow-up with orthopedic surgery  Discharge Diagnoses: Principal Problem:   Closed displaced fracture of right femoral neck (HCC) Active Problems:   Hypertension   Iron  deficiency anemia   Hypokalemia   Advanced dementia (HCC)   Hyperlipidemia, unspecified   Memory impairment   Anxiety and depression  Resolved Problems:   * No resolved hospital problems. *  Hospital Course: 83 y.o. female with medical history significant of dementia, hyperlipidemia, basal cell carcinoma, essential hypertension, history of pulmonary nodule, who was brought in from [facility] skilled facility after sustaining a mechanical fall.  Patient resides in the memory care unit over there.  She has right hip pain.  She normally ambulates but suspected to have the unwitnessed fall.  No one knows how long she was down.  Patient is accompanied by the daughter here.  She has had previous fall with right humeral fracture from June of this year which is healing.  She is apparently a DNI DNR at some point.  Not on any blood thinners.  Family want patient to proceed with surgery because she has been able to walk.  Dr. Tobie orthopedics consulted and will do surgery tomorrow...    Patient was admitted to medicine service and orthopedic surgery took patient to the OR on 02/27/2024 for percutaneous pinning of the right femoral neck fracture.   Post-operative PT/OT are following and recommending SNF/rehab.  Family were initially hopeful to return to [facility] with home health for rehab to keep patient in her familiar environment.  However, they have decided to pursue rehab in hopes pt will recover her mobility to prior baseline.  They plan for  her usual paid sitters to be with her at rehab.  9/10.  Received insurance authorization for rehab this afternoon.  Patient will discharge out to rehab.  Assessment and Plan: * Closed displaced fracture of right femoral neck (HCC) Status post percutaneous pinning of right femoral neck fracture by Dr. Tobie on 9/5.  Patient will go to rehab.  Pain control.  Iron  deficiency anemia Patient given iron  infusions on 9/6 and 9/7.  Last hemoglobin 8.2  Hypertension On metoprolol , hydralazine  and losartan   Hypokalemia Replace orally  Advanced dementia [facility]) Family pays for private sitters.  Hyperlipidemia, unspecified On Lipitor  Family concerned that she lost 2 teeth here.  Will need to get to an oral surgeon as outpatient.  Will give a quick course of Augmentin  which will cover mouth and also urine.       Consultants: Orthopedic surgery Procedures performed: Percutaneous pinning of right femoral neck fracture on 9/5 by Dr. Tobie Disposition: Rehabilitation facility Diet recommendation:  Regular DISCHARGE MEDICATION: Allergies as of 03/03/2024       Reactions   Meperidine Other (See Comments)   Other Reaction: GI Upset Other reaction(s): Other (See Comments) Other reaction(s): Other (See Comments) Other Reaction: GI Upset Other Reaction: GI Upset   Clindamycin    Clindamycin/lincomycin    Demerol    Hydrocodone    Sulfa Antibiotics    Codeine    Other reaction(s): Other (See Comments) Other Reaction: GI Upset   Penicillins    Other reaction(s): Other (See Comments) Other Reaction: GI Upset  Medication List     STOP taking these medications    ibuprofen 200 MG tablet Commonly known as: ADVIL   senna 8.6 MG Tabs tablet Commonly known as: SENOKOT       TAKE these medications    Acetaminophen  Extra Strength 500 MG Tabs Take 2 tablets by mouth 3 (three) times daily.   allopurinol  100 MG tablet Commonly known as: ZYLOPRIM  Take 100 mg by mouth  daily.   amoxicillin -clavulanate 500-125 MG tablet Commonly known as: AUGMENTIN  Take 1 tablet by mouth 2 (two) times daily for 9 doses.   aspirin  81 MG chewable tablet Chew 81 mg by mouth daily.   atorvastatin  40 MG tablet Commonly known as: LIPITOR Take 40 mg by mouth at bedtime.   busPIRone  10 MG tablet Commonly known as: BUSPAR  Take 1 tablet (10 mg total) by mouth 2 (two) times daily.   Calcium  Antacid Extra Strength 750 MG chewable tablet Generic drug: calcium  carbonate Chew 1 tablet by mouth daily.   donepezil  5 MG tablet Commonly known as: ARICEPT  TAKE 1 TABLET (5 MG TOTAL) BY MOUTH AT BEDTIME.   enoxaparin  40 MG/0.4ML injection Commonly known as: LOVENOX  Inject 0.4 mLs (40 mg total) into the skin daily for 14 days.   hydrALAZINE  50 MG tablet Commonly known as: APRESOLINE  Take 1 tablet (50 mg total) by mouth 2 (two) times daily. What changed:  medication strength how much to take   Iron  325 (65 Fe) MG Tabs Take 1 tablet by mouth daily.   LORazepam  0.5 MG tablet Commonly known as: ATIVAN  Take 1 tablet (0.5 mg total) by mouth 2 (two) times daily as needed for anxiety (OR AGITATION). (MAX 1 DOSE/24 HRS) What changed:  when to take this Another medication with the same name was removed. Continue taking this medication, and follow the directions you see here.   losartan  100 MG tablet Commonly known as: COZAAR  Take 100 mg by mouth daily. CHECK BP AND PULSE PRIOR TO ADMINISTRATION. HOLD MEDICATION IF SYSTOLIC BP LESS THAN 110.   metoprolol  succinate 25 MG 24 hr tablet Commonly known as: TOPROL -XL Take 12.5 mg by mouth daily. HOLD IF SBP<105, PULSE<60. (DO NOT CRUSH.)   Minerin Creme Crea Apply 1 Application topically every morning. APPLY TOPICALLY TO ARM AND LEGS EVERY MORNING   mupirocin  ointment 2 % Commonly known as: BACTROBAN  Place 1 Application into the nose 2 (two) times daily for 3 days.   oxyCODONE  5 MG immediate release tablet Commonly known as:  Oxy IR/ROXICODONE  Take 1 tablet (5 mg total) by mouth every 4 (four) hours as needed for severe pain (pain score 7-10) (pain score 7-10).   polyethylene glycol 17 g packet Commonly known as: MIRALAX  / GLYCOLAX  Take 17 g by mouth daily as needed for moderate constipation.   potassium chloride  20 MEQ packet Commonly known as: Klor-Con  Take 20 mEq by mouth 2 (two) times daily for 3 days.   sertraline  100 MG tablet Commonly known as: ZOLOFT  Take 100 mg by mouth at bedtime.   Sodium Fluoride 5000 Plus 1.1 % Crea dental cream Generic drug: sodium fluoride Place 1 Application onto teeth at bedtime. BRUSH WITH PEA SIZE AMOUNT AT BEDTIME AND DO NOT RINSE.   traZODone  50 MG tablet Commonly known as: DESYREL  Take 25 mg by mouth at bedtime.   Vitamin D3 50 MCG (2000 UT) Tabs Take 1 tablet by mouth daily.        Contact information for follow-up providers     Verlinda Boas, PA-C  Follow up in 2 week(s).   Specialty: Orthopedic Surgery Why: For staple removal and x-rays of the right hip Contact information: [address] [place] [address] [phone]              Contact information for after-discharge care     Destination     [facility] [place], [place]. [place]   Service: Skilled Nursing Contact information: [address] [place]  [address] [phone]                    Discharge Exam: Filed Weights   02/27/24 1900 02/28/24 1537  Weight: 61 kg 61.3 kg   Physical Exam HENT:     Head: Normocephalic.     Mouth/Throat:     Pharynx: No oropharyngeal exudate.  Eyes:     General: Lids are normal.     Conjunctiva/sclera: Conjunctivae normal.  Cardiovascular:     Rate and Rhythm: Normal rate and regular rhythm.     Heart sounds: Normal heart sounds, S1 normal and S2 normal.  Pulmonary:     Breath sounds: No decreased breath sounds, wheezing, rhonchi or rales.  Abdominal:     Palpations: Abdomen is soft.      Tenderness: There is no abdominal tenderness.  Musculoskeletal:     Right lower leg: No swelling.     Left lower leg: No swelling.  Skin:    General: Skin is warm.     Comments: Large bruising right thigh  Neurological:     Mental Status: She is alert.     Comments: Unable to lift right leg up      Condition at discharge: stable  The results of significant diagnostics from this hospitalization (including imaging, microbiology, ancillary and laboratory) are listed below for reference.   Imaging Studies: DG HIP UNILAT WITH PELVIS 2-3 VIEWS RIGHT Result Date: 02/27/2024 CLINICAL DATA:  Elective surgery. EXAM: DG HIP (WITH OR WITHOUT PELVIS) 2-3V RIGHT COMPARISON:  Preoperative imaging FINDINGS: Four fluoroscopic spot views of the right hip submitted from the operating room. Screws traverse right femoral neck fracture. Fluoroscopy time 45 seconds. Fluoroscopy dose 8.86 mGy. IMPRESSION: Intraoperative fluoroscopy during right femoral neck fracture fixation. Electronically Signed   By: Andrea Gasman M.D.   On: 02/27/2024 17:04   DG C-Arm 1-60 Min-No Report Result Date: 02/27/2024 Fluoroscopy was utilized by the requesting physician.  No radiographic interpretation.   CT Hip Right Wo Contrast Result Date: 02/26/2024 CLINICAL DATA:  Evaluate for worsening fracture EXAM: CT OF THE RIGHT HIP WITHOUT CONTRAST TECHNIQUE: Multidetector CT imaging of the right hip was performed according to the standard protocol. Multiplanar CT image reconstructions were also generated. RADIATION DOSE REDUCTION: This exam was performed according to the departmental dose-optimization program which includes automated exposure control, adjustment of the mA and/or kV according to patient size and/or use of iterative reconstruction technique. COMPARISON:  X-ray pelvis 12/18/2023, x-ray right hip 02/26/2024 FINDINGS: Bones/Joint/Cartilage Acute impacted right subcapital femoral neck fracture. No right hip dislocation. No acute  displaced fracture or diastasis of the visualized bones of the right pelvis. Moderate degenerative changes of the right hip. No aggressive appearing focal bone abnormality. Ligaments Suboptimally assessed by CT. Muscles and Tendons Grossly remarkable. Soft tissues Right hip subcutaneus soft tissue hematoma. Other: Atherosclerotic plaque.  Colonic diverticulosis. IMPRESSION: 1. Acute impacted right subcapital femoral neck fracture. 2. Right hip subcutaneus soft tissue hematoma. Electronically Signed   By: Morgane  Naveau M.D.   On: 02/26/2024 22:13  DG Shoulder Right Result Date: 02/26/2024 CLINICAL DATA:  concern for fracture EXAM: RIGHT SHOULDER - 2+ VIEW COMPARISON:  None Available. FINDINGS: Age-indeterminate nondisplaced fracture of the humeral head. No dislocation of the right shoulder. Degenerative changes of the right shoulder. No aggressive appearing focal bone abnormality. Soft tissues are unremarkable. IMPRESSION: Age-indeterminate nondisplaced fracture of the humeral head. Electronically Signed   By: Morgane  Naveau M.D.   On: 02/26/2024 21:34   DG Chest 1 View Result Date: 02/26/2024 CLINICAL DATA:  [id number] Fall [id number] EXAM: CHEST  1 VIEW COMPARISON:  None Available. FINDINGS: The heart and mediastinal contours are within normal limits. Atherosclerotic plaque. No focal consolidation. No pulmonary edema. No pleural effusion. No pneumothorax. No acute osseous abnormality. Degenerative changes of the right shoulder. IMPRESSION: 1. No active disease. 2.  Aortic Atherosclerosis (ICD10-I70.0). Electronically Signed   By: Morgane  Naveau M.D.   On: 02/26/2024 21:33   DG Hip Unilat W or Wo Pelvis 2-3 Views Right Result Date: 02/26/2024 CLINICAL DATA:  concern for fracture EXAM: DG HIP (WITH OR WITHOUT PELVIS) 2-3V RIGHT COMPARISON:  None Available. FINDINGS: Limited evaluation due to overlapping osseous structures and overlying soft tissues. Question acute nondisplaced fracture of the right femoral  head/neck. Moderate degenerative changes of the right hip. Right hip subcutaneus tissue edema. Vascular calcification. IMPRESSION: Question acute nondisplaced fracture of the right femoral head/neck. Limited evaluation due to overlapping osseous structures and overlying soft tissues. Electronically Signed   By: Morgane  Naveau M.D.   On: 02/26/2024 21:30   CT Head Wo Contrast Result Date: 02/26/2024 CLINICAL DATA:  Unwitnessed fall; pain after fall EXAM: CT HEAD WITHOUT CONTRAST CT CERVICAL SPINE WITHOUT CONTRAST TECHNIQUE: Multidetector CT imaging of the head and cervical spine was performed following the standard protocol without intravenous contrast. Multiplanar CT image reconstructions of the cervical spine were also generated. RADIATION DOSE REDUCTION: This exam was performed according to the departmental dose-optimization program which includes automated exposure control, adjustment of the mA and/or kV according to patient size and/or use of iterative reconstruction technique. COMPARISON:  CT max face 12/18/2023 FINDINGS: CT HEAD FINDINGS Brain: Cerebral ventricle sizes are concordant with the degree of cerebral volume loss. Patchy and confluent areas of decreased attenuation are noted throughout the deep and periventricular white matter of the cerebral hemispheres bilaterally, compatible with chronic microvascular ischemic disease. No evidence of large-territorial acute infarction. No parenchymal hemorrhage. No mass lesion. No extra-axial collection. No mass effect or midline shift. No hydrocephalus. Basilar cisterns are patent. Vascular: No hyperdense vessel. Atherosclerotic calcifications are present within the cavernous internal carotid arteries. Skull: No acute fracture or focal lesion. Old depressed left orbital floor fracture. Sinuses/Orbits: Paranasal sinuses and mastoid air cells are clear. Bilateral lens replacement. Otherwise the orbits are unremarkable. Other: None. CT CERVICAL SPINE FINDINGS  Alignment: Grade 1 anterolisthesis of C2 on C3, C3 on C4, C4 on C5. Skull base and vertebrae: Multilevel moderate severe degenerative changes spine. No associated severe osseous neural foraminal or central canal stenosis. No acute fracture. No aggressive appearing focal osseous lesion or focal pathologic process. Soft tissues and spinal canal: No prevertebral fluid or swelling. No visible canal hematoma. Upper chest: Unremarkable. Other: None. IMPRESSION: 1. No acute intracranial abnormality. 2. No acute displaced fracture or traumatic listhesis of the cervical spine. 3. Old depressed left orbital floor fracture. Electronically Signed   By: Morgane  Naveau M.D.   On: 02/26/2024 20:27   CT Cervical Spine Wo Contrast Result Date: 02/26/2024 CLINICAL DATA:  Unwitnessed fall; pain  after fall EXAM: CT HEAD WITHOUT CONTRAST CT CERVICAL SPINE WITHOUT CONTRAST TECHNIQUE: Multidetector CT imaging of the head and cervical spine was performed following the standard protocol without intravenous contrast. Multiplanar CT image reconstructions of the cervical spine were also generated. RADIATION DOSE REDUCTION: This exam was performed according to the departmental dose-optimization program which includes automated exposure control, adjustment of the mA and/or kV according to patient size and/or use of iterative reconstruction technique. COMPARISON:  CT max face 12/18/2023 FINDINGS: CT HEAD FINDINGS Brain: Cerebral ventricle sizes are concordant with the degree of cerebral volume loss. Patchy and confluent areas of decreased attenuation are noted throughout the deep and periventricular white matter of the cerebral hemispheres bilaterally, compatible with chronic microvascular ischemic disease. No evidence of large-territorial acute infarction. No parenchymal hemorrhage. No mass lesion. No extra-axial collection. No mass effect or midline shift. No hydrocephalus. Basilar cisterns are patent. Vascular: No hyperdense vessel.  Atherosclerotic calcifications are present within the cavernous internal carotid arteries. Skull: No acute fracture or focal lesion. Old depressed left orbital floor fracture. Sinuses/Orbits: Paranasal sinuses and mastoid air cells are clear. Bilateral lens replacement. Otherwise the orbits are unremarkable. Other: None. CT CERVICAL SPINE FINDINGS Alignment: Grade 1 anterolisthesis of C2 on C3, C3 on C4, C4 on C5. Skull base and vertebrae: Multilevel moderate severe degenerative changes spine. No associated severe osseous neural foraminal or central canal stenosis. No acute fracture. No aggressive appearing focal osseous lesion or focal pathologic process. Soft tissues and spinal canal: No prevertebral fluid or swelling. No visible canal hematoma. Upper chest: Unremarkable. Other: None. IMPRESSION: 1. No acute intracranial abnormality. 2. No acute displaced fracture or traumatic listhesis of the cervical spine. 3. Old depressed left orbital floor fracture. Electronically Signed   By: Morgane  Naveau M.D.   On: 02/26/2024 20:27   DG Hip Unilat W or Wo Pelvis 2-3 Views Left Result Date: 02/26/2024 CLINICAL DATA:  Fall and left hip pain. EXAM: DG HIP (WITH OR WITHOUT PELVIS) 2-3V LEFT COMPARISON:  None Available. FINDINGS: No acute fracture or dislocation. The bones are osteopenic. Moderate bilateral hip arthritic changes. The soft tissues are unremarkable. Vascular calcifications noted. IMPRESSION: 1. No acute fracture or dislocation. 2. Moderate bilateral hip arthritic changes. Electronically Signed   By: Vanetta Chou M.D.   On: 02/26/2024 20:21    Microbiology: Results for orders placed or performed during the hospital encounter of 02/26/24  MRSA Next Gen by PCR, Nasal     Status: None   Collection Time: 02/27/24 12:05 AM   Specimen: Nasal Mucosa; Nasal Swab  Result Value Ref Range Status   MRSA by PCR Next Gen NOT DETECTED NOT DETECTED Final    Comment: (NOTE) The GeneXpert MRSA Assay (FDA approved  for NASAL specimens only), is one component of a comprehensive MRSA colonization surveillance program. It is not intended to diagnose MRSA infection nor to guide or monitor treatment for MRSA infections. Test performance is not FDA approved in patients less than 44 years old. Performed at [facility], [address] [place], [place] [address]     Labs: CBC: Recent Labs  Lab 02/26/24 2026 02/27/24 0431 02/28/24 9391 02/29/24 0627 03/01/24 0822 03/02/24 0723 03/03/24 0416  WBC 11.0*   < > 8.7 8.4 7.9 6.6 9.9  NEUTROABS 8.8*  --   --   --   --   --   --   HGB 10.5*   < > 9.0* 8.0* 8.0* 7.8* 8.2*  HCT 32.5*   < >  28.3* 25.6* 26.4* 24.5* 25.5*  MCV 93.1   < > 94.3 95.9 97.4 95.3 94.4  PLT 204   < > 173 178 169 174 209   < > = values in this interval not displayed.   Basic Metabolic Panel: Recent Labs  Lab 02/28/24 0608 02/29/24 0627 03/01/24 0822 03/02/24 0723 03/03/24 0416  NA 139 143 142 142 141  K 4.0 3.7 3.4* 3.3* 3.2*  CL 105 105 106 108 109  CO2 28 25 28 25 23   GLUCOSE 125* 96 105* 107* 90  BUN 19 25* 19 21 21   CREATININE 0.92 0.98 0.86 0.84 0.75  CALCIUM  8.8* 8.8* 8.9 8.6* 8.9  MG  --   --   --  2.2  --    Liver Function Tests: Recent Labs  Lab 02/26/24 2026 02/27/24 0431  AST 26 23  ALT 13 12  ALKPHOS 58 51  BILITOT 0.6 0.8  PROT 6.7 6.0*  ALBUMIN 3.7 3.6   CBG: No results for input(s): GLUCAP in the last 168 hours.  Discharge time spent: greater than 30 minutes.  Signed: Charlie Patterson, MD [facility] 03/03/2024

## 2024-03-03 NOTE — Progress Notes (Signed)
 Subjective: 5 Days Post-Op Procedure(s) (LRB): FIXATION, FEMUR, NECK, PERCUTANEOUS, USING SCREW (Right) Patient is asleep upon provider arrival with sitter present in room. States had a long night and did not sleep.  Less Agitated. Patient appears well, and has had no acute complaints or problems History of dementia. We will continue therapy today.  Plan is to go rehab after hospital stay.  Objective: Vital signs in last 24 hours: Temp:  [97.6 F (36.4 C)-97.8 F (36.6 C)] 97.8 F (36.6 C) (09/10 0414) Pulse Rate:  [55-102] 80 (09/10 0414) Resp:  [16-22] 16 (09/10 0414) BP: (139-192)/(77-111) 159/77 (09/10 0414) SpO2:  [97 %-100 %] 97 % (09/10 0414)  Intake/Output from previous day:  Intake/Output Summary (Last 24 hours) at 03/03/2024 0659 Last data filed at 03/03/2024 0643 Gross per 24 hour  Intake 290 ml  Output 1350 ml  Net -1060 ml    Intake/Output this shift: Total I/O In: -  Out: 950 [Urine:950]  Labs: Recent Labs    03/01/24 0822 03/02/24 0723 03/03/24 0416  HGB 8.0* 7.8* 8.2*   Recent Labs    03/02/24 0723 03/03/24 0416  WBC 6.6 9.9  RBC 2.57* 2.70*  HCT 24.5* 25.5*  PLT 174 209   Recent Labs    03/02/24 0723 03/03/24 0416  NA 142 141  K 3.3* 3.2*  CL 108 109  CO2 25 23  BUN 21 21  CREATININE 0.84 0.75  GLUCOSE 107* 90  CALCIUM  8.6* 8.9   No results for input(s): LABPT, INR in the last 72 hours.   EXAM General - Patient is Alert, Appropriate, and Oriented Extremity - Neurologically intact Neurovascular intact Sensation intact distally Intact pulses distally Dorsiflexion/Plantar flexion intact No cellulitis present Compartment soft Dressing - Dressing clean, scant drainage Motor Function - intact, moving foot and toes well on exam.  No physical therapy.  Past Medical History:  Diagnosis Date   Basal cell carcinoma    Dementia (HCC)    Hyperlipidemia    Hypertension    Microscopic hematuria 06/24/2008   negative workuo    Pulmonary nodule     Assessment/Plan: 5 Days Post-Op Procedure(s) (LRB): FIXATION, FEMUR, NECK, PERCUTANEOUS, USING SCREW (Right) Principal Problem:   Left displaced femoral neck fracture (HCC) Active Problems:   Hyperlipidemia   Hypertension   Memory impairment   Anxiety and depression  Estimated body mass index is 23.94 kg/m as calculated from the following:   Height as of 12/18/23: 5' 3 (1.6 m).   Weight as of this encounter: 61.3 kg. Advance diet Up with therapy  Patient has dementia at baseline, sitter present during visit  Patient will continue to work with physical therapy. Weight-Bearing as tolerated to right leg  VSS and labs show hgb at 8.2 consistent with postoperative blood loss. Continue to trend  DVT prophylaxis: Lovenox  and SCDs, will continue lovenox  for 2 weeks with discharge Patient will follow-up with [facility] clinic orthopedics in 2 weeks for re-imaging and reevaluation   Krystal Doyne, PA-C [facility] 03/03/2024, 6:59 AM

## 2024-03-03 NOTE — Hospital Course (Signed)
 83 y.o. female with medical history significant of dementia, hyperlipidemia, basal cell carcinoma, essential hypertension, history of pulmonary nodule, who was brought in from [facility] skilled facility after sustaining a mechanical fall.  Patient resides in the memory care unit over there.  She has right hip pain.  She normally ambulates but suspected to have the unwitnessed fall.  No one knows how long she was down.  Patient is accompanied by the daughter here.  She has had previous fall with right humeral fracture from June of this year which is healing.  She is apparently a DNI DNR at some point.  Not on any blood thinners.  Family want patient to proceed with surgery because she has been able to walk.  Dr. Tobie orthopedics consulted and will do surgery tomorrow...    Patient was admitted to medicine service and orthopedic surgery took patient to the OR on 02/27/2024 for percutaneous pinning of the right femoral neck fracture.   Post-operative PT/OT are following and recommending SNF/rehab.  Family were initially hopeful to return to [facility] with home health for rehab to keep patient in her familiar environment.  However, they have decided to pursue rehab in hopes pt will recover her mobility to prior baseline.  They plan for her usual paid sitters to be with her at rehab.  9/10.  Received insurance authorization for rehab this afternoon.  Patient will discharge out to rehab.

## 2024-03-03 NOTE — Assessment & Plan Note (Signed)
 Replace orally.

## 2024-03-03 NOTE — Progress Notes (Signed)
 Physical Therapy Treatment Patient Details Name: Pam Keller MRN: [id number] DOB: [birth year] Today's Date: 03/03/2024   History of Present Illness 82 y.o. female with a past medical history of severe dementia, hyperlipidemia, and hypertension who presented to the emergency department after an unwitnessed fall at her memory care unit.  Found to have R hip fx and is s/p ORIF pinning.  Pt had a fall with R shoulder fx in June.    PT Comments  Pt was long sitting in bed upon arrival. She is alert but does present with severe dementia. Pt seems to be close to baseline cognition. She eventually agrees to session with max encouragement and tcs/vcs. Pt does exit L side of bed, stand to RW and tolerate ambulation ~ 150 ft total. +2 assistance for safety however pt easily and safely able to stand/ ambulate when cooperative. Pt will benefit from continued skilled PT at DC to maximize independence and safety with all ADLs.  Recommend pt return to her memory unit and have HHPT there.   If plan is discharge home, recommend the following: Assistance with feeding;Assist for transportation;Supervision due to cognitive status;Two people to help with bathing/dressing/bathroom;Two people to help with walking and/or transfers     Equipment Recommendations  BSC/3in1;Rolling walker (2 wheels)       Precautions / Restrictions Precautions Precautions: Fall Recall of Precautions/Restrictions: Impaired Precaution/Restrictions Comments: per secure chat with ortho PA, okay to use RW with recent RUE fx Restrictions Weight Bearing Restrictions Per Provider Order: Yes RLE Weight Bearing Per Provider Order: Weight bearing as tolerated     Mobility  Bed Mobility Overal bed mobility: Needs Assistance Bed Mobility: Supine to Sit, Sit to Supine  Supine to sit: Min assist, Max assist  General bed mobility comments: Amount of assistance greatly impacted by pt's partcipation and willingness to perform desired task     Transfers Overall transfer level: Needs assistance Equipment used: Rolling walker (2 wheels), 2 person hand held assist Transfers: Sit to/from Stand Sit to Stand: Min assist, Max assist   Ambulation/Gait Ambulation/Gait assistance: Min Chemical engineer (Feet): 75 Feet Assistive device: Rolling walker (2 wheels) Gait Pattern/deviations: Step-through pattern, Narrow base of support Gait velocity: decreased  General Gait Details: Pt is able to ambulate 2 x 75 ft with RW + recliner follow. pt impulsively lets go of RW throughout gait training. pt is easily distracted     Balance Overall balance assessment: Needs assistance Sitting-balance support: Feet supported Sitting balance-Leahy Scale: Good     Standing balance support: Bilateral upper extremity supported, No upper extremity supported, Single extremity supported, During functional activity Standing balance-Leahy Scale: Poor      Communication Communication Communication: Impaired Factors Affecting Communication: Difficulty expressing self  Cognition Arousal: Alert Behavior During Therapy: Impulsive   PT - Cognitive impairments: History of cognitive impairments    PT - Cognition Comments: Pt is alert but has severe dementia that greatly impacts session progression Following commands: Intact Following commands impaired: Follows one step commands inconsistently    Cueing Cueing Techniques: Verbal cues, Tactile cues, Gestural cues, Visual cues     General Comments General comments (skin integrity, edema, etc.): pt was seated in recliner with daughter and safety sitter in place      Pertinent Vitals/Pain Pain Assessment Pain Assessment: No/denies pain Pain Score: 0-No pain     PT Goals (current goals can now be found in the care plan section) Acute Rehab PT Goals Patient Stated Goal: Daughter hoping to get get back to  walking, wishes to return to [employer] Progress towards PT goals: Progressing toward  goals    Frequency    7X/week       AM-PAC PT 6 Clicks Mobility   Outcome Measure  Help needed turning from your back to your side while in a flat bed without using bedrails?: A Lot Help needed moving from lying on your back to sitting on the side of a flat bed without using bedrails?: A Lot Help needed moving to and from a bed to a chair (including a wheelchair)?: A Lot Help needed standing up from a chair using your arms (e.g., wheelchair or bedside chair)?: A Lot Help needed to walk in hospital room?: A Lot Help needed climbing 3-5 steps with a railing? : A Lot 6 Click Score: 12    End of Session   Activity Tolerance: Patient tolerated treatment well Patient left: in chair;with call bell/phone within reach;with nursing/sitter in room;with family/visitor present Nurse Communication: Mobility status PT Visit Diagnosis: Muscle weakness (generalized) (M62.81);Difficulty in walking, not elsewhere classified (R26.2);Unsteadiness on feet (R26.81)     Time: 9053-8995 PT Time Calculation (min) (ACUTE ONLY): 18 min  Charges:    $Gait Training: 8-22 mins PT General Charges $$ ACUTE PT VISIT: 1 Visit                     Rankin Essex PTA 03/03/24, 12:58 PM

## 2024-03-03 NOTE — Assessment & Plan Note (Signed)
 Family pays for private sitters.

## 2024-03-03 NOTE — Assessment & Plan Note (Signed)
 Status post percutaneous pinning of right femoral neck fracture by Dr. Tobie on 9/5.  Patient will go to rehab.  Pain control.

## 2024-03-11 ENCOUNTER — Emergency Department: Admission: EM | Admit: 2024-03-11 | Discharge: 2024-03-11 | Disposition: A | Discharge status: Home / Self Care

## 2024-03-11 ENCOUNTER — Emergency Department

## 2024-03-11 DIAGNOSIS — I10 Essential (primary) hypertension: Secondary | ICD-10-CM | POA: Insufficient documentation

## 2024-03-11 DIAGNOSIS — F0390 Unspecified dementia without behavioral disturbance: Secondary | ICD-10-CM | POA: Insufficient documentation

## 2024-03-11 DIAGNOSIS — Z7982 Long term (current) use of aspirin: Secondary | ICD-10-CM | POA: Diagnosis not present

## 2024-03-11 DIAGNOSIS — L7622 Postprocedural hemorrhage and hematoma of skin and subcutaneous tissue following other procedure: Secondary | ICD-10-CM | POA: Diagnosis present

## 2024-03-11 LAB — CBC WITH DIFFERENTIAL/PLATELET
Abs Immature Granulocytes: 0.05 K/uL (ref 0.00–0.07)
Basophils Absolute: 0.1 K/uL (ref 0.0–0.1)
Basophils Relative: 1 %
Eosinophils Absolute: 0.3 K/uL (ref 0.0–0.5)
Eosinophils Relative: 3 %
HCT: 32 % — ABNORMAL LOW (ref 36.0–46.0)
Hemoglobin: 9.8 g/dL — ABNORMAL LOW (ref 12.0–15.0)
Immature Granulocytes: 1 %
Lymphocytes Relative: 18 %
Lymphs Abs: 1.7 K/uL (ref 0.7–4.0)
MCH: 29.7 pg (ref 26.0–34.0)
MCHC: 30.6 g/dL (ref 30.0–36.0)
MCV: 97 fL (ref 80.0–100.0)
Monocytes Absolute: 0.8 K/uL (ref 0.1–1.0)
Monocytes Relative: 9 %
Neutro Abs: 6.5 K/uL (ref 1.7–7.7)
Neutrophils Relative %: 68 %
Platelets: 358 K/uL (ref 150–400)
RBC: 3.3 MIL/uL — ABNORMAL LOW (ref 3.87–5.11)
RDW: 17.5 % — ABNORMAL HIGH (ref 11.5–15.5)
WBC: 9.5 K/uL (ref 4.0–10.5)
nRBC: 0 % (ref 0.0–0.2)

## 2024-03-11 LAB — PROTIME-INR
INR: 1.1 (ref 0.8–1.2)
Prothrombin Time: 14.4 s (ref 11.4–15.2)

## 2024-03-11 LAB — TYPE AND SCREEN
ABO/RH(D): A POS
Antibody Screen: NEGATIVE

## 2024-03-11 LAB — BASIC METABOLIC PANEL WITH GFR
Anion gap: 11 (ref 5–15)
BUN: 29 mg/dL — ABNORMAL HIGH (ref 8–23)
CO2: 22 mmol/L (ref 22–32)
Calcium: 9.6 mg/dL (ref 8.9–10.3)
Chloride: 108 mmol/L (ref 98–111)
Creatinine, Ser: 1.25 mg/dL — ABNORMAL HIGH (ref 0.44–1.00)
GFR, Estimated: 43 mL/min — ABNORMAL LOW (ref >60)
Glucose, Bld: 98 mg/dL (ref 70–99)
Potassium: 3.9 mmol/L (ref 3.5–5.1)
Sodium: 141 mmol/L (ref 135–145)

## 2024-03-11 MED ORDER — HALOPERIDOL LACTATE 5 MG/ML IJ SOLN
1.0000 mg | Freq: Once | INTRAMUSCULAR | Status: AC
  Administered 2024-03-11: 1 mg via INTRAVENOUS
  Filled 2024-03-11: qty 1

## 2024-03-11 MED ORDER — ASPIRIN 81 MG PO CHEW: 81.0000 mg | CHEWABLE_TABLET | Freq: Two times a day (BID) | ORAL | 0 refills | Status: AC

## 2024-03-11 NOTE — ED Provider Notes (Signed)
 [facility] Provider Note    Event Date/Time   First MD Initiated Contact with Patient 03/11/24 1138     (approximate)   History   Post-op Problem  Pt coming from [facility]. Bleeding from R thigh s/p surgery. Facility states she hasd bleed through multiple pads.    HPI Pam Keller is a 83 y.o. female PMH advanced dementia, hypertension, hyperlipidemia, iron  deficiency anemia with recent admission 9/4-9/03/2024 for close displaced fracture of right femoral neck status post percutaneous pinning (9/5) presents for bleeding from surgical wound - Patient is a noncontributory historian.  Per EMS, she was noted to have bleeding from her recent right hip surgical site today and has soaked through several pads.  Sent to ED for eval. - Patient has severe dementia and does not provide any history - Known to have severe behavioral disturbances and intermittent aggression at baseline per EMS.  Note that her usual caretaker from [facility] is en route to hospital.  Per discharge summary, appears patient was discharged on 14-day course of Lovenox .  Also is on aspirin  81 mg daily.     Physical Exam   Triage Vital Signs: ED Triage Vitals  Encounter Vitals Group     BP 03/11/24 1146 136/64     Girls Systolic BP Percentile --      Girls Diastolic BP Percentile --      Boys Systolic BP Percentile --      Boys Diastolic BP Percentile --      Pulse Rate 03/11/24 1146 64     Resp --      Temp --      Temp src --      SpO2 03/11/24 1146 96 %     Weight --      Height --      Head Circumference --      Peak Flow --      Pain Score 03/11/24 1143 7     Pain Loc --      Pain Education --      Exclude from Growth Chart --     Most recent vital signs: Vitals:   03/11/24 1146  BP: 136/64  Pulse: 64  SpO2: 96%     General: Awake, no distress CV:  Good peripheral perfusion.  Resp:  Normal effort.  Abd:  No distention.  RLE:  Wound dressing was  soaked with dark red blood, wound undressed --no frank bleeding appreciated though does appear to have small hematoma around surgical site.  No skin erythema or wound dehiscence. Psych:  Intermittently agitated, difficult to verbally redirect   ED Results / Procedures / Treatments   Labs (all labs ordered are listed, but only abnormal results are displayed) Labs Reviewed  CBC WITH DIFFERENTIAL/PLATELET - Abnormal; Notable for the following components:      Result Value   RBC 3.30 (*)    Hemoglobin 9.8 (*)    HCT 32.0 (*)    RDW 17.5 (*)    All other components within normal limits  BASIC METABOLIC PANEL WITH GFR - Abnormal; Notable for the following components:   BUN 29 (*)    Creatinine, Ser 1.25 (*)    GFR, Estimated 43 (*)    All other components within normal limits  PROTIME-INR  TYPE AND SCREEN     EKG  See ED course below.   RADIOLOGY Radiology interpreted by myself and radiology report reviewed.  No acute pathology identified.  PROCEDURES:  Critical Care performed: No  Procedures   MEDICATIONS ORDERED IN ED: Medications  haloperidol  lactate (HALDOL ) injection 1 mg (1 mg Intravenous Given 03/11/24 1345)     IMPRESSION / MDM / ASSESSMENT AND PLAN / ED COURSE  I reviewed the triage vital signs and the nursing notes.                              DDX/MDM/AP: Differential diagnosis includes, but is not limited to, postsurgical bleeding, no clear evidence of wound dehiscence, consider development of underlying hematoma.  Doubt significant blood loss, not clinically concern for wound infection at this time.  No history to suggest trauma though given patient is very limited historian will screen with x-ray to ensure no hardware malfunction or recurrent fracture.  Plan: - Labs - X-ray right hip - Plan to discuss with orthopedics - Anticipate patient may require sedation as she is requiring aggressive redirection with limited success here, keeps attempting to  get out of bed  Patient's presentation is most consistent with acute presentation with potential threat to life or bodily function.  The patient is on the cardiac monitor to evaluate for evidence of arrhythmia and/or significant heart rate changes.  ED course below.  Workup reassuring.  Discussed with orthopedics who recommend discontinuing Lovenox , increasing aspirin  to twice daily, applying compression drop pending and following up in orthopedic clinic.  Plan of care discussed with family and caretaker who are in agreement.  Discharged.   Clinical Course as of 03/11/24 1520  Thu Mar 11, 2024  1302 XR R hip: IMPRESSION: Status post surgical internal fixation of proximal right femoral fracture.   [MM]  1304 Ecg = sinus rhythm, rate 65, no gross ST elevation or depression, no significant repolarization abnormality, normal axis, normal intervals.  QTc 457.  No evidence of ischemia nor arrhythmia on my interpretation. [MM]  1309 CBC with improved anemia [MM]  1331 ED secretary paging orthopedics [MM]  1338 D/w Dr. Lorelle of ortho - Recommends discontinue Lovenox , do aspirin  81 mg twice daily -Compressive dressing here in emergency department -Okay for routine follow-up appointment (currently scheduled for 03/18/24), otherwise provider should call orthopedic clinic if she does have bleeding again and they can see her in clinic more urgently [MM]    Clinical Course User Index [MM] Clarine Ozell LABOR, MD     FINAL CLINICAL IMPRESSION(S) / ED DIAGNOSES   Final diagnoses:  Postoperative hemorrhage of subcutaneous tissue following non-dermatologic procedure     Rx / DC Orders   ED Discharge Orders          Ordered    aspirin  81 MG chewable tablet  2 times daily        03/11/24 1341             Note:  This document was prepared using Dragon voice recognition software and may include unintentional dictation errors.   Clarine Ozell LABOR, MD 03/11/24 [phone]

## 2024-03-11 NOTE — ED Notes (Signed)
 [facility] called for transport to [facility]

## 2024-03-11 NOTE — ED Triage Notes (Signed)
 Pt coming from [facility]. Bleeding from R thigh s/p surgery. Facility states she hasd bleed through multiple pads.

## 2024-03-11 NOTE — Discharge Instructions (Addendum)
 Your evaluation in the emergency department was overall reassuring.  I discussed your case with orthopedic surgeon, and they recommended STOPPING the Lovenox  and INCREASING the aspirin  to 81 mg twice daily (instead of once daily).  We applied a compression dressing here, and you can continue to do this facility.  If mild bleeding recurs, the orthopedic doctor requested that you call the orthopedic clinic and they would arrange to see you in clinic sooner.  Otherwise they recommend you keep your currently scheduled appointment on 03/18/2024.  Return to the emergency department with any new or worsening symptoms.

## 2024-03-11 NOTE — ED Notes (Signed)
 Report called to [facility] and given to Avelina, Charity fundraiser.
